# Patient Record
Sex: Female | Born: 1945
Health system: Southern US, Community
[De-identification: ages and names within clinical notes are randomized; demographics above are authoritative.]

## PROBLEM LIST (undated history)

## (undated) DIAGNOSIS — R51 Headache: Secondary | ICD-10-CM

## (undated) DIAGNOSIS — K219 Gastro-esophageal reflux disease without esophagitis: Secondary | ICD-10-CM

## (undated) DIAGNOSIS — Z9889 Other specified postprocedural states: Secondary | ICD-10-CM

## (undated) DIAGNOSIS — R519 Headache, unspecified: Secondary | ICD-10-CM

## (undated) DIAGNOSIS — Z170 Estrogen receptor positive status [ER+]: Principal | ICD-10-CM

## (undated) DIAGNOSIS — E559 Vitamin D deficiency, unspecified: Secondary | ICD-10-CM

## (undated) DIAGNOSIS — G610 Guillain-Barre syndrome: Secondary | ICD-10-CM

## (undated) DIAGNOSIS — M1990 Unspecified osteoarthritis, unspecified site: Secondary | ICD-10-CM

## (undated) DIAGNOSIS — Z8719 Personal history of other diseases of the digestive system: Secondary | ICD-10-CM

## (undated) DIAGNOSIS — R42 Dizziness and giddiness: Secondary | ICD-10-CM

## (undated) DIAGNOSIS — T8859XA Other complications of anesthesia, initial encounter: Secondary | ICD-10-CM

## (undated) DIAGNOSIS — E7800 Pure hypercholesterolemia, unspecified: Secondary | ICD-10-CM

## (undated) DIAGNOSIS — R112 Nausea with vomiting, unspecified: Secondary | ICD-10-CM

## (undated) DIAGNOSIS — J189 Pneumonia, unspecified organism: Secondary | ICD-10-CM

## (undated) DIAGNOSIS — I160 Hypertensive urgency: Secondary | ICD-10-CM

## (undated) DIAGNOSIS — K589 Irritable bowel syndrome without diarrhea: Secondary | ICD-10-CM

## (undated) DIAGNOSIS — C50312 Malignant neoplasm of lower-inner quadrant of left female breast: Principal | ICD-10-CM

## (undated) DIAGNOSIS — T4145XA Adverse effect of unspecified anesthetic, initial encounter: Secondary | ICD-10-CM

## (undated) DIAGNOSIS — I209 Angina pectoris, unspecified: Secondary | ICD-10-CM

## (undated) DIAGNOSIS — F419 Anxiety disorder, unspecified: Secondary | ICD-10-CM

## (undated) DIAGNOSIS — Z8041 Family history of malignant neoplasm of ovary: Secondary | ICD-10-CM

## (undated) DIAGNOSIS — G709 Myoneural disorder, unspecified: Secondary | ICD-10-CM

## (undated) HISTORY — DX: Anxiety disorder, unspecified: F41.9

## (undated) HISTORY — PX: TONSILLECTOMY: SUR1361

## (undated) HISTORY — DX: Dizziness and giddiness: R42

## (undated) HISTORY — DX: Irritable bowel syndrome, unspecified: K58.9

## (undated) HISTORY — PX: TUMOR REMOVAL: SHX12

## (undated) HISTORY — DX: Pure hypercholesterolemia, unspecified: E78.00

## (undated) HISTORY — DX: Vitamin D deficiency, unspecified: E55.9

## (undated) HISTORY — PX: OTHER SURGICAL HISTORY: SHX169

## (undated) HISTORY — DX: Estrogen receptor positive status (ER+): Z17.0

## (undated) HISTORY — DX: Malignant neoplasm of lower-inner quadrant of left female breast: C50.312

## (undated) HISTORY — PX: ABDOMINAL HYSTERECTOMY: SHX81

## (undated) HISTORY — DX: Unspecified osteoarthritis, unspecified site: M19.90

## (undated) HISTORY — DX: Family history of malignant neoplasm of ovary: Z80.41

---
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
## 1998-09-15 ENCOUNTER — Emergency Department (HOSPITAL_COMMUNITY): Admission: EM | Admit: 1998-09-15 | Discharge: 1998-09-15 | Payer: Self-pay | Admitting: Emergency Medicine

## 1998-09-16 ENCOUNTER — Encounter: Payer: Self-pay | Admitting: Internal Medicine

## 1999-01-07 ENCOUNTER — Encounter: Payer: Self-pay | Admitting: Urology

## 1999-01-10 ENCOUNTER — Observation Stay (HOSPITAL_COMMUNITY): Admission: RE | Admit: 1999-01-10 | Discharge: 1999-01-11 | Payer: Self-pay | Admitting: Urology

## 1999-05-26 ENCOUNTER — Inpatient Hospital Stay (HOSPITAL_COMMUNITY): Admission: RE | Admit: 1999-05-26 | Discharge: 1999-05-28 | Payer: Self-pay | Admitting: Gynecology

## 1999-06-21 ENCOUNTER — Observation Stay (HOSPITAL_COMMUNITY): Admission: RE | Admit: 1999-06-21 | Discharge: 1999-06-22 | Payer: Self-pay | Admitting: Gynecology

## 2000-02-29 ENCOUNTER — Other Ambulatory Visit: Admission: RE | Admit: 2000-02-29 | Discharge: 2000-02-29 | Payer: Self-pay | Admitting: Gynecology

## 2001-03-11 ENCOUNTER — Other Ambulatory Visit: Admission: RE | Admit: 2001-03-11 | Discharge: 2001-03-11 | Payer: Self-pay | Admitting: Gynecology

## 2001-08-28 ENCOUNTER — Ambulatory Visit (HOSPITAL_COMMUNITY): Admission: RE | Admit: 2001-08-28 | Discharge: 2001-08-28 | Payer: Self-pay | Admitting: Gastroenterology

## 2002-03-12 ENCOUNTER — Other Ambulatory Visit: Admission: RE | Admit: 2002-03-12 | Discharge: 2002-03-12 | Payer: Self-pay | Admitting: Gynecology

## 2002-11-12 ENCOUNTER — Encounter: Admission: RE | Admit: 2002-11-12 | Discharge: 2002-11-12 | Payer: Self-pay | Admitting: Family Medicine

## 2002-11-12 ENCOUNTER — Encounter: Payer: Self-pay | Admitting: Family Medicine

## 2002-11-28 ENCOUNTER — Ambulatory Visit (HOSPITAL_COMMUNITY): Admission: RE | Admit: 2002-11-28 | Discharge: 2002-11-28 | Payer: Self-pay | Admitting: Endocrinology

## 2002-11-28 ENCOUNTER — Encounter: Payer: Self-pay | Admitting: Endocrinology

## 2002-11-28 ENCOUNTER — Encounter (INDEPENDENT_AMBULATORY_CARE_PROVIDER_SITE_OTHER): Payer: Self-pay | Admitting: *Deleted

## 2003-02-12 ENCOUNTER — Encounter: Admission: RE | Admit: 2003-02-12 | Discharge: 2003-05-13 | Payer: Self-pay | Admitting: Family Medicine

## 2003-04-09 ENCOUNTER — Other Ambulatory Visit: Admission: RE | Admit: 2003-04-09 | Discharge: 2003-04-09 | Payer: Self-pay | Admitting: Gynecology

## 2003-05-14 ENCOUNTER — Encounter: Admission: RE | Admit: 2003-05-14 | Discharge: 2003-06-18 | Payer: Self-pay | Admitting: Family Medicine

## 2004-05-05 ENCOUNTER — Other Ambulatory Visit: Admission: RE | Admit: 2004-05-05 | Discharge: 2004-05-05 | Payer: Self-pay | Admitting: Gynecology

## 2005-05-23 ENCOUNTER — Other Ambulatory Visit: Admission: RE | Admit: 2005-05-23 | Discharge: 2005-05-23 | Payer: Self-pay | Admitting: Gynecology

## 2006-05-22 ENCOUNTER — Inpatient Hospital Stay (HOSPITAL_COMMUNITY): Admission: EM | Admit: 2006-05-22 | Discharge: 2006-05-24 | Payer: Self-pay | Admitting: Emergency Medicine

## 2006-06-12 ENCOUNTER — Other Ambulatory Visit: Admission: RE | Admit: 2006-06-12 | Discharge: 2006-06-12 | Payer: Self-pay | Admitting: Gynecology

## 2006-12-03 ENCOUNTER — Encounter: Admission: RE | Admit: 2006-12-03 | Discharge: 2006-12-03 | Payer: Self-pay | Admitting: Endocrinology

## 2010-07-11 ENCOUNTER — Other Ambulatory Visit: Payer: Self-pay | Admitting: Gynecology

## 2010-08-12 NOTE — H&P (Signed)
Margaret Hodges, Margaret Hodges               ACCOUNT NO.:  [id number]   MEDICAL RECORD NO.:  [id number]          PATIENT TYPE:  INP   LOCATION:  1833                         FACILITY:  [facility]   PHYSICIAN:  Lucita Ferrara, MD         DATE OF BIRTH:  12/28/1945   DATE OF ADMISSION:  05/22/2006  DATE OF DISCHARGE:                              HISTORY & PHYSICAL   HISTORY OF PRESENT ILLNESS:  The patient is a 65 year old female with  past medical history significant for irritable bowel syndrome,  hyperlipidemia and Guillain-Barre syndrome, who presents to [facility] with chief complaint of anterior chest pain.  Chest pain  is located in the anterior precordial area, radiating to the jaw.  Chest  pain apparently started earlier in the day, not associated with food,  non-reproducible, no association with movement or body position.  Chest  pain was pressure-like in nature, again radiating to the jaw area and  into the left arm.  She states that this has never happened before.  Chest pain was accompanied by some shortness of breath.  The patient  denies ever having any cardiovascular procedures.  She states that she  has never had a stress test.  Of note, the patient is a smoker.   PAST MEDICAL HISTORY:  1. Arthritis.  2. Guillain-Barre syndrome.  3. Hyperlipidemia.  4. Irritable bowel syndrome.  5. The patient has a history of an ulcerative esophagus and IBS.   MEDICATIONS:  The patient is only taking Percocet as needed for joint  pain.   ALLERGIES:  The patient is allergic to:  1. FEXOFENADINE.  2. KEFLEX.  3. ALL PENICILLINS.   PHYSICAL EXAMINATION:  GENERAL:  The patient is in no acute distress.  VITAL SIGNS:  Blood pressure 152/93, pulse is 93, respirations 18, pulse  oximetry 97% on room air.  HEENT:  Normocephalic, atraumatic.  No JVD.  No carotid bruits.  PERRLA.  Extraocular muscles intact.  NECK:  Supple.  No thyromegaly.  CARDIOVASCULAR:  S1 and S2.  Regular rate and  rhythm.  No murmurs, rubs  or clicks.  LUNGS:  Clear to auscultation bilaterally.  No rales, rhonchi or  wheezes.  ABDOMEN:  Soft, non-distended and nontender.  Positive bowel sounds.  NEUROLOGIC:  Alert and oriented x3.  Cranial nerves II-XII grossly  intact.  Cerebellar intact.  Reflexes 2+ bilaterally, upper and lower  extremities.  Pulses equal to 2+ bilaterally, upper and lower  extremities.  Strength 5/5 bilaterally, upper and lower extremities.   LABORATORY AND ACCESSORY CLINICAL DATA:  Sodium 137, potassium 4.2,  chloride 109, BUN 25, glucose 94.  White count 12.2, hemoglobin 14.4,  hematocrit 42, platelets 292,000.  Cardiac enzymes negative x3.   Chest x-ray within normal limits.   EKG was consistent with left axis deviation, anterior wall ST-T wave  changes.  Interestingly enough, EKG changes were resolved 2 hours later  after the patient was given pain medications.   ASSESSMENT AND PLAN:  [age]-year-old patient with chest pain, rule  out myocardial infarction or ischemia.  We will go ahead  and admit to  the hospital for overnight observation, cardiac enzymes x3 every 8  hours.  I am going to go ahead and order a thallium exercise stress  test.  I am going to go ahead and consult Cardiology.  Of note above,  the patient actually had EKG changes which resolved.  Her symptoms are  classic and good/consistent with possible ischemic-related cause of her  chest pain.  The patient has been explained the plans and procedures of  this admission; she understands.      Lucita Ferrara, MD  Electronically Signed     RR/MEDQ  D:  05/23/2006  T:  05/23/2006  Job:  [id number]

## 2010-08-12 NOTE — Procedures (Signed)
[place]  Patient:    Margaret Hodges, Margaret Hodges Visit Number: [id number] MRN: [id number]          Service Type: END Location: ENDO Attending Physician:  Charna Elizabeth Dictated by:   Anselmo Rod, M.D. Proc. Date: 08/28/01 Admit Date:  08/28/2001 Discharge Date: 08/28/2001   CC:         Caryn Bee L. Little, M.D.   Procedure Report  DATE OF BIRTH:  1945-06-18.  REFERRING PHYSICIAN:  Anna Genre. Little, M.D.  PROCEDURE PERFORMED:  Colonoscopy.  ENDOSCOPIST:  Anselmo Rod, M.D.  INSTRUMENT USED:  Olympus pediatric video colonoscope.  INDICATIONS FOR PROCEDURE:  The patient is a 65 year old white female with a history of abdominal pain, guaiac positive stool and family history of colonic polyps and colitis.  Rule out masses, polyps etc.  PREPROCEDURE PREPARATION:  Informed consent was procured from the patient. The patient was fasted for eight hours prior to the procedure and prepped with a bottle of magnesium citrate and a gallon of NuLytely the night prior to the procedure.  PREPROCEDURE PHYSICAL:  The patient had stable vital signs.  Neck supple. Chest clear to auscultation.  S1, S2 regular.  Abdomen soft with normal bowel sounds.  DESCRIPTION OF PROCEDURE:  The patient was placed in the left lateral decubitus position and sedated with 80 mg of Demerol and 8 mg of Versed intravenously.  Once the patient was adequately sedated and maintained on low-flow oxygen and continuous cardiac monitoring, the Olympus pediatric colonoscope was advanced from the rectum to the cecum without difficulty.  The patient had a few left-sided diverticula, small nonbleeding internal hemorrhoids seen on retroflexion.  No masses or polyps were seen.  The procedure was completed up to the cecum.  The appendiceal and ileocecal valve were clearly visualized and photographed.  IMPRESSION: 1. Small nonbleeding internal hemorrhoid. 2. Few left-sided  diverticula.  RECOMMENDATIONS: 1. A high fiber diet has been recommended for the patient. 2. Repeat colorectal cancer screening is recommended in the next five years    unless the patient develops any abnormal symptoms in the interim. 3. Outpatient follow-up in the next one to two weeks.Dictated by:   Anselmo Rod, M.D. Attending Physician:  Charna Elizabeth DD:  08/28/01 TD:  08/30/01 Job: [id number]

## 2010-08-12 NOTE — Consult Note (Signed)
Margaret Hodges, Margaret Hodges               ACCOUNT NO.:  [id number]   MEDICAL RECORD NO.:  [id number]          PATIENT TYPE:  INP   LOCATION:  3731                         FACILITY:  [facility]   PHYSICIAN:  Corky Crafts, MDDATE OF BIRTH:  1946-01-13   DATE OF CONSULTATION:  DATE OF DISCHARGE:                                 CONSULTATION   REASON FOR CONSULTATION:  Chest pain.   HISTORY OF PRESENT ILLNESS:  The patient is a 65 year old woman with no  known history of heart disease who has had 6 weeks of intermittent  substernal chest pain which is worse with activity and better with rest.  She states that in the past her chest tightness has been associated with  lightheadedness and arm tingling.  She also feels short of breath and  has neck pain.  She denies nausea or vomiting.  She has not had syncope.  She had been on prednisone recently because of some joint problems.  She  did have some chest pain several years ago when taking prednisone, but  these resolved spontaneously and did not involve her arm.  They were not  as severe at that time.   PAST MEDICAL HISTORY:  1. Hyperlipidemia.  2. History of Guillain Barre syndrome.  3. IBS.  4. Arthritis.  5. Sciatica.   SOCIAL HISTORY:  The patient does smoke.  She is thinking about  quitting.  She denies alcohol.  She does use any illegal drugs.  She  works as a Nature conservation officer at [employer].  She is married.   FAMILY HISTORY:  Mother died at age 57 but had coronary artery disease  diagnosed in her 31s.  Mother also had diabetes and hypertension.   ALLERGIES:  TO FEXOFENADINE, KEFLEX AND PENICILLIN.   MEDICATIONS AT HOME:  Vicodin p.r.n.   REVIEW OF SYSTEMS:  No recent fevers or chills, no weight loss, no rash.  No focal weakness, chest pain or shortness of breath as described above.  No orthopnea, no PND.  All other systems negative.   PHYSICAL EXAM:  VITAL SIGNS:  99/78, heart rate of 63.  GENERAL:  Patient is awake, alert, in no apparent  distress.  HEAD:  Normocephalic, atraumatic.  EYES:  Extraocular movements intact.  NECK:  No carotid bruits.  CARDIOVASCULAR:  Regular rate and rhythm, S1-S2, no murmurs.  LUNGS:  Clear to auscultation bilaterally.  ABDOMEN:  Soft, nontender, nondistended.  EXTREMITIES:  Showed no edema.  NEURO:  No focal motor or sensory deficits.  SKIN:  No rash.  BACK:  No kyphosis.  PSYCH:  Normal in affect.   EKG reveals normal sinus rhythm, no acute ST-T wave changes.  Chest x-  ray shows no acute cardiopulmonary disease.   LAB WORK:  Shows creatinine 0.72, troponin 0.02.   ASSESSMENT/PLAN:  A 65 year old with risk factors for heart disease who  is having ongoing chest pain.   PLAN:  1. We discussed various types of evaluation including stress test and      catheterization.  Given now that she is having some symptoms of      chest pain at  rest, will proceed with a cardiac catheterization.      Even if she had a negative stress test, I am not sure I would      believe the results.  Continue Lovenox for now.  Add nitrates as      necessary.  The risks and benefits of cardiac catheterization were      explained to the patient, and informed consent was obtained  2. Hyperlipidemia.  Will check her lipids.  3. We recommend smoking cessation as well.  4. The cath is planned for tomorrow.      Corky Crafts, MD  Electronically Signed     JSV/MEDQ  D:  05/24/2006  T:  05/24/2006  Job:  [id number]

## 2010-08-12 NOTE — Cardiovascular Report (Signed)
Margaret Hodges, Margaret Hodges               ACCOUNT NO.:  [id number]   MEDICAL RECORD NO.:  [id number]          PATIENT TYPE:  INP   LOCATION:  3731                         FACILITY:  [facility]   PHYSICIAN:  Corky Crafts, MDDATE OF BIRTH:  22-Nov-1945   DATE OF PROCEDURE:  DATE OF DISCHARGE:                            CARDIAC CATHETERIZATION   REFERRING PHYSICIAN:  Dr. Lucita Ferrara   PROCEDURES PERFORMED:  1. Left heart catheterization.  2. Left ventriculogram.  3. Coronary angiogram.  4. Abdominal aortogram.   INDICATIONS:  Unstable angina.   OPERATOR:  Dr. Eldridge Dace   PROCEDURE NARRATIVE:  The risks and benefits of cardiac catheterization  were explained to the patient and informed consent was obtained.  The  patient was brought to the cath lab, she was prepped and draped in the  usual sterile fashion, her right groin was infiltrated with 1%  lidocaine.  A 6-French arterial sheath was placed into the right femoral  artery using the modified Seldinger technique.  Left coronary artery  angiography was performed using a JL-4 pigtail catheter.  The catheter  was advanced to the vessel ostium under fluoroscopic guidance, visual  angiography was performed in multiple projections using hand injection  of contrast.  Right coronary artery angiography was performed using a JR-  4 pigtail catheter.  The catheter was advanced to the vessel ostium  under fluoroscopic guidance, visual angiography was performed in  multiple projections using hand injection of contrast.  A left  ventriculogram was then performed using a pigtail catheter.  The  catheter was advanced to the aortic valve and across under fluoroscopic  guidance.  Power injection of contrast was performed in a 30-degree RAO  position.  The catheter was then pulled back under continuous  hemodynamic pressure monitoring, catheter was pulled back to the level  of the renal arteries.  Power injection of contrast was done to  visualize the  abdominal aorta.  The sheath will be removed using manual  compression.   FINDINGS:  The left main is a fairly long vessel which is widely patent.   Circumflex is a middle-to-large-size vessel which appears  angiographically normal.   There is a medium-size OM1 and OM2 which are angiographically normal.   The left anterior descending is a middle-size vessel with mild luminal  irregularities.   The first diagonal is a large vessel and appears angiographically  normal.   The right coronary artery is a medium-size dominant vessel, there was  catheter spasm noted at the ostium and its vessel.  Overall, there is no  significant atherosclerosis noted.   The left ventriculogram reveals normal left ventricular function with an  estimated ejection fraction of 60%, there is no mitral regurgitation.   The abdominal aortogram shows widely patent renal arteries bilaterally,  there are single renal arteries to both kidneys, there is mild abdominal  atherosclerosis below the renal arteries.   HEMODYNAMICS:  Left ventricular pressure 127/3 with an LVEDP of 12 mmHg,  aortic pressure of 127/70 with a mean aortic pressure of 96 mmHg.   IMPRESSION:  1. No significant coronary artery disease.  2.  Noncardiac chest pain.  3. Normal left ventricular function.  4. No renal artery stenosis.   RECOMMENDATIONS:  Patient should continue with aggressive risk-factor  modification, but have her start aspirin 81 mg a day and encourage her  to avoid any tobacco products.  She should also have aggressive  management of her lipids.      Corky Crafts, MD  Electronically Signed     JSV/MEDQ  D:  05/24/2006  T:  05/24/2006  Job:  [id number]

## 2011-07-18 ENCOUNTER — Other Ambulatory Visit: Payer: Self-pay | Admitting: Gynecology

## 2011-12-21 DIAGNOSIS — D7589 Other specified diseases of blood and blood-forming organs: Secondary | ICD-10-CM | POA: Diagnosis not present

## 2011-12-21 DIAGNOSIS — Z79899 Other long term (current) drug therapy: Secondary | ICD-10-CM | POA: Diagnosis not present

## 2011-12-21 DIAGNOSIS — E531 Pyridoxine deficiency: Secondary | ICD-10-CM | POA: Diagnosis not present

## 2011-12-21 DIAGNOSIS — E782 Mixed hyperlipidemia: Secondary | ICD-10-CM | POA: Diagnosis not present

## 2011-12-21 DIAGNOSIS — E559 Vitamin D deficiency, unspecified: Secondary | ICD-10-CM | POA: Diagnosis not present

## 2012-08-05 DIAGNOSIS — J329 Chronic sinusitis, unspecified: Secondary | ICD-10-CM | POA: Diagnosis not present

## 2012-08-05 DIAGNOSIS — M7150 Other bursitis, not elsewhere classified, unspecified site: Secondary | ICD-10-CM | POA: Diagnosis not present

## 2012-08-05 DIAGNOSIS — F17200 Nicotine dependence, unspecified, uncomplicated: Secondary | ICD-10-CM | POA: Diagnosis not present

## 2012-11-19 DIAGNOSIS — Z1231 Encounter for screening mammogram for malignant neoplasm of breast: Secondary | ICD-10-CM | POA: Diagnosis not present

## 2013-03-07 DIAGNOSIS — Z0000 Encounter for general adult medical examination without abnormal findings: Secondary | ICD-10-CM | POA: Diagnosis not present

## 2013-03-07 DIAGNOSIS — D7589 Other specified diseases of blood and blood-forming organs: Secondary | ICD-10-CM | POA: Diagnosis not present

## 2013-03-07 DIAGNOSIS — E7800 Pure hypercholesterolemia, unspecified: Secondary | ICD-10-CM | POA: Diagnosis not present

## 2013-03-07 DIAGNOSIS — E785 Hyperlipidemia, unspecified: Secondary | ICD-10-CM | POA: Diagnosis not present

## 2013-03-07 DIAGNOSIS — E041 Nontoxic single thyroid nodule: Secondary | ICD-10-CM | POA: Diagnosis not present

## 2013-03-07 DIAGNOSIS — M1990 Unspecified osteoarthritis, unspecified site: Secondary | ICD-10-CM | POA: Diagnosis not present

## 2013-03-07 DIAGNOSIS — E559 Vitamin D deficiency, unspecified: Secondary | ICD-10-CM | POA: Diagnosis not present

## 2013-03-07 DIAGNOSIS — E049 Nontoxic goiter, unspecified: Secondary | ICD-10-CM | POA: Diagnosis not present

## 2013-03-07 DIAGNOSIS — K649 Unspecified hemorrhoids: Secondary | ICD-10-CM | POA: Diagnosis not present

## 2013-03-25 DIAGNOSIS — E8941 Symptomatic postprocedural ovarian failure: Secondary | ICD-10-CM | POA: Diagnosis not present

## 2013-04-01 DIAGNOSIS — L820 Inflamed seborrheic keratosis: Secondary | ICD-10-CM | POA: Diagnosis not present

## 2013-04-01 DIAGNOSIS — L570 Actinic keratosis: Secondary | ICD-10-CM | POA: Diagnosis not present

## 2013-04-01 DIAGNOSIS — D235 Other benign neoplasm of skin of trunk: Secondary | ICD-10-CM | POA: Diagnosis not present

## 2013-04-01 DIAGNOSIS — D2320 Other benign neoplasm of skin of unspecified ear and external auricular canal: Secondary | ICD-10-CM | POA: Diagnosis not present

## 2013-04-23 DIAGNOSIS — D2320 Other benign neoplasm of skin of unspecified ear and external auricular canal: Secondary | ICD-10-CM | POA: Diagnosis not present

## 2013-05-08 DIAGNOSIS — E559 Vitamin D deficiency, unspecified: Secondary | ICD-10-CM | POA: Diagnosis not present

## 2014-01-06 DIAGNOSIS — Z1231 Encounter for screening mammogram for malignant neoplasm of breast: Secondary | ICD-10-CM | POA: Diagnosis not present

## 2014-04-01 DIAGNOSIS — J329 Chronic sinusitis, unspecified: Secondary | ICD-10-CM | POA: Diagnosis not present

## 2014-04-01 DIAGNOSIS — J029 Acute pharyngitis, unspecified: Secondary | ICD-10-CM | POA: Diagnosis not present

## 2014-04-04 ENCOUNTER — Emergency Department (HOSPITAL_COMMUNITY): Payer: Medicare Other

## 2014-04-04 ENCOUNTER — Observation Stay (HOSPITAL_COMMUNITY)
Admission: EM | Admit: 2014-04-04 | Discharge: 2014-04-06 | Disposition: A | Discharge status: Home / Self Care | Payer: Medicare Other | Attending: Internal Medicine | Admitting: Internal Medicine

## 2014-04-04 ENCOUNTER — Encounter (HOSPITAL_COMMUNITY): Payer: Self-pay | Admitting: Emergency Medicine

## 2014-04-04 DIAGNOSIS — E785 Hyperlipidemia, unspecified: Secondary | ICD-10-CM | POA: Diagnosis present

## 2014-04-04 DIAGNOSIS — I10 Essential (primary) hypertension: Secondary | ICD-10-CM | POA: Diagnosis not present

## 2014-04-04 DIAGNOSIS — Z888 Allergy status to other drugs, medicaments and biological substances status: Secondary | ICD-10-CM | POA: Insufficient documentation

## 2014-04-04 DIAGNOSIS — Z8249 Family history of ischemic heart disease and other diseases of the circulatory system: Secondary | ICD-10-CM | POA: Insufficient documentation

## 2014-04-04 DIAGNOSIS — R079 Chest pain, unspecified: Principal | ICD-10-CM | POA: Diagnosis present

## 2014-04-04 DIAGNOSIS — I160 Hypertensive urgency: Secondary | ICD-10-CM | POA: Diagnosis present

## 2014-04-04 DIAGNOSIS — Z720 Tobacco use: Secondary | ICD-10-CM | POA: Diagnosis present

## 2014-04-04 DIAGNOSIS — F17210 Nicotine dependence, cigarettes, uncomplicated: Secondary | ICD-10-CM | POA: Diagnosis not present

## 2014-04-04 DIAGNOSIS — Z881 Allergy status to other antibiotic agents status: Secondary | ICD-10-CM | POA: Diagnosis not present

## 2014-04-04 DIAGNOSIS — Z880 Allergy status to penicillin: Secondary | ICD-10-CM | POA: Insufficient documentation

## 2014-04-04 DIAGNOSIS — R1013 Epigastric pain: Secondary | ICD-10-CM | POA: Diagnosis not present

## 2014-04-04 DIAGNOSIS — Z882 Allergy status to sulfonamides status: Secondary | ICD-10-CM | POA: Diagnosis not present

## 2014-04-04 DIAGNOSIS — R072 Precordial pain: Secondary | ICD-10-CM | POA: Diagnosis not present

## 2014-04-04 DIAGNOSIS — I871 Compression of vein: Secondary | ICD-10-CM | POA: Diagnosis not present

## 2014-04-04 HISTORY — DX: Hypertensive urgency: I16.0

## 2014-04-04 LAB — CBC WITH DIFFERENTIAL/PLATELET
BASOS ABS: 0 10*3/uL (ref 0.0–0.1)
Basophils Relative: 0 % (ref 0–1)
Eosinophils Absolute: 0.5 10*3/uL (ref 0.0–0.7)
Eosinophils Relative: 4 % (ref 0–5)
HEMATOCRIT: 41.7 % (ref 36.0–46.0)
HEMOGLOBIN: 14.2 g/dL (ref 12.0–15.0)
LYMPHS ABS: 4.1 10*3/uL — AB (ref 0.7–4.0)
LYMPHS PCT: 36 % (ref 12–46)
MCH: 33.4 pg (ref 26.0–34.0)
MCHC: 34.1 g/dL (ref 30.0–36.0)
MCV: 98.1 fL (ref 78.0–100.0)
MONO ABS: 1 10*3/uL (ref 0.1–1.0)
MONOS PCT: 9 % (ref 3–12)
NEUTROS ABS: 5.9 10*3/uL (ref 1.7–7.7)
NEUTROS PCT: 51 % (ref 43–77)
Platelets: 254 10*3/uL (ref 150–400)
RBC: 4.25 MIL/uL (ref 3.87–5.11)
RDW: 13.3 % (ref 11.5–15.5)
WBC: 11.5 10*3/uL — ABNORMAL HIGH (ref 4.0–10.5)

## 2014-04-04 LAB — URINALYSIS, ROUTINE W REFLEX MICROSCOPIC
BILIRUBIN URINE: NEGATIVE
Glucose, UA: NEGATIVE mg/dL
Hgb urine dipstick: NEGATIVE
Ketones, ur: NEGATIVE mg/dL
Leukocytes, UA: NEGATIVE
NITRITE: NEGATIVE
PROTEIN: NEGATIVE mg/dL
Specific Gravity, Urine: 1.017 (ref 1.005–1.030)
UROBILINOGEN UA: 0.2 mg/dL (ref 0.0–1.0)
pH: 6 (ref 5.0–8.0)

## 2014-04-04 LAB — I-STAT TROPONIN, ED: TROPONIN I, POC: 0 ng/mL (ref 0.00–0.08)

## 2014-04-04 LAB — COMPREHENSIVE METABOLIC PANEL
ALBUMIN: 3.9 g/dL (ref 3.5–5.2)
ALT: 39 U/L — ABNORMAL HIGH (ref 0–35)
ANION GAP: 10 (ref 5–15)
AST: 27 U/L (ref 0–37)
Alkaline Phosphatase: 74 U/L (ref 39–117)
BUN: 18 mg/dL (ref 6–23)
CALCIUM: 9.8 mg/dL (ref 8.4–10.5)
CO2: 21 mmol/L (ref 19–32)
Chloride: 103 mEq/L (ref 96–112)
Creatinine, Ser: 0.83 mg/dL (ref 0.50–1.10)
GFR calc Af Amer: 83 mL/min — ABNORMAL LOW (ref >90)
GFR, EST NON AFRICAN AMERICAN: 71 mL/min — AB (ref >90)
GLUCOSE: 112 mg/dL — AB (ref 70–99)
Potassium: 4 mmol/L (ref 3.5–5.1)
SODIUM: 134 mmol/L — AB (ref 135–145)
TOTAL PROTEIN: 6.8 g/dL (ref 6.0–8.3)
Total Bilirubin: 0.3 mg/dL (ref 0.3–1.2)

## 2014-04-04 LAB — D-DIMER, QUANTITATIVE (NOT AT ARMC)

## 2014-04-04 MED ORDER — SODIUM CHLORIDE 0.9 % IV SOLN
Freq: Once | INTRAVENOUS | Status: AC
  Administered 2014-04-04: 22:00:00 via INTRAVENOUS

## 2014-04-04 MED ORDER — IOHEXOL 350 MG/ML SOLN
100.0000 mL | Freq: Once | INTRAVENOUS | Status: AC | PRN
  Administered 2014-04-04: 100 mL via INTRAVENOUS

## 2014-04-04 NOTE — ED Notes (Signed)
Pick[place], MD at bedside.  

## 2014-04-04 NOTE — ED Notes (Signed)
Per EMS, pt started having sharp, substernal chest pain around 1900 this evening. Pt took her BP at home and got 601 systolic. Upon EMS arrival to her home, the pt's pressure was 194/100. Pt states that she is not currently treated for HTN, but her doctor may put her on medication. Pt took 324 ASA at home, no NTG given.

## 2014-04-04 NOTE — ED Notes (Signed)
Olean Ree, NP at bedside.

## 2014-04-04 NOTE — ED Provider Notes (Signed)
CSN: [id number]     Arrival date & time 04/04/14  2019 History   First MD Initiated Contact with Patient 04/04/14 2024     Chief Complaint  Patient presents with  . Chest Pain     (Consider location/radiation/quality/duration/timing/severity/associated sxs/prior Treatment) HPI Comments: For the past 3 days has had constant epigastric pain that radiated to back Has had this pain for a "while" but worse in the last 3 days   Patient is a 69 y.o. female presenting with chest pain. The history is provided by the patient.  Chest Pain Pain location:  Substernal area Pain quality: pressure   Pain radiates to:  Mid back Pain radiates to the back: yes   Pain severity:  Moderate Onset quality:  Gradual Timing:  Constant Progression:  Resolved Chronicity:  New Context: at rest   Context: not breathing, not eating, not lifting, no movement, not raising an arm and no stress   Relieved by:  Aspirin Ineffective treatments:  None tried Associated symptoms: back pain   Associated symptoms: no abdominal pain, no anxiety, no cough, no diaphoresis, no dysphagia, no fever, no nausea, no numbness, no palpitations, no shortness of breath and no weakness   Risk factors: obesity     Past Medical History  Diagnosis Date  . Hypertensive urgency 04/05/2014   Past Surgical History  Procedure Laterality Date  . Tonsillectomy    . Abdominal hysterectomy    . Tumor removal     History reviewed. No pertinent family history. History  Substance Use Topics  . Smoking status: Current Every Day Smoker -- 0.50 packs/day    Types: Cigarettes  . Smokeless tobacco: Not on file  . Alcohol Use: No   OB History    No data available     Review of Systems  Constitutional: Negative for fever and diaphoresis.  HENT: Negative for trouble swallowing.   Respiratory: Negative for cough and shortness of breath.   Cardiovascular: Positive for chest pain. Negative for palpitations and leg swelling.   Gastrointestinal: Negative for nausea and abdominal pain.  Genitourinary: Negative for dysuria.  Musculoskeletal: Positive for back pain.  Neurological: Negative for weakness and numbness.      Allergies  Allegra; Amoxapine and related; Clindamycin/lincomycin; Keflex; Penicillins; Prednisone; Statins; and Sulfa antibiotics  Home Medications   Prior to Admission medications   Medication Sig Start Date End Date Taking? Authorizing Provider  azithromycin (ZITHROMAX) 250 MG tablet Take 1 tablet by mouth daily. 5 day regimen started 04/01/14 04/01/14  Yes Historical Provider, MD  conjugated estrogens (PREMARIN) vaginal cream Place 1 Applicatorful vaginally daily.   Yes Historical Provider, MD  diclofenac sodium (VOLTAREN) 1 % GEL Apply 4 g topically 4 (four) times daily.   Yes Historical Provider, MD  HYDROcodone-acetaminophen (NORCO/VICODIN) 5-325 MG per tablet Take 1 tablet by mouth every 6 (six) hours as needed for moderate pain.   Yes Historical Provider, MD  pseudoephedrine-acetaminophen (TYLENOL SINUS) 30-500 MG TABS Take 1 tablet by mouth every 4 (four) hours as needed.   Yes Historical Provider, MD   BP 129/77 mmHg  Pulse 85  Temp(Src) 97.8 F (36.6 C) (Oral)  Resp 18  Ht 5' 8.5" (1.74 m)  Wt 173 lb 4.8 oz (78.608 kg)  BMI 25.96 kg/m2  SpO2 97% Physical Exam  Constitutional: She is oriented to person, place, and time. She appears well-developed and well-nourished. No distress.  HENT:  Head: Normocephalic and atraumatic.  Eyes: Pupils are equal, round, and reactive to light.  Neck:  Normal range of motion.  Cardiovascular: Normal rate and regular rhythm.   Pulmonary/Chest: Effort normal and breath sounds normal. She has no wheezes.  Abdominal: Soft. She exhibits no distension. There is no tenderness.  Musculoskeletal: Normal range of motion. She exhibits no edema or tenderness.  Neurological: She is alert and oriented to person, place, and time.  Skin: Skin is warm. No  erythema.  Nursing note and vitals reviewed.   ED Course  Procedures (including critical care time) Labs Review Labs Reviewed  CBC WITH DIFFERENTIAL - Abnormal; Notable for the following:    WBC 11.5 (*)    Lymphs Abs 4.1 (*)    All other components within normal limits  COMPREHENSIVE METABOLIC PANEL - Abnormal; Notable for the following:    Sodium 134 (*)    Glucose, Bld 112 (*)    ALT 39 (*)    GFR calc non Af Amer 71 (*)    GFR calc Af Amer 83 (*)    All other components within normal limits  D-DIMER, QUANTITATIVE  URINALYSIS, ROUTINE W REFLEX MICROSCOPIC  TROPONIN I  TROPONIN I  TROPONIN I  LIPID PANEL  I-STAT TROPOININ, ED  I-STAT TROPOININ, ED    Imaging Review Dg Chest Port 1 View  04/04/2014   CLINICAL DATA:  Initial evaluation for sharp substernal chest pain for 2 hr, high blood pressure  EXAM: PORTABLE CHEST - 1 VIEW  COMPARISON:  05/23/2006  FINDINGS: Heart size and vascular pattern are normal. Ectasia of the aorta. Lungs clear. No pleural effusion.  IMPRESSION: No active disease.   Electronically Signed   By: Skipper Cliche M.D.   On: 04/04/2014 21:02   Ct Angio Chest Aorta W/cm &/or Wo/cm  04/04/2014   CLINICAL DATA:  Sharp substernal chest pain around 7 o'clock. Elevated blood pressure tumor 20. Chest pain the radiates to back. Concern for aortic dissection  EXAM: CT ANGIOGRAPHY CHEST, ABDOMEN AND PELVIS  TECHNIQUE: Multidetector CT imaging through the chest, abdomen and pelvis was performed using the standard protocol during bolus administration of intravenous contrast. Multiplanar reconstructed images and MIPs were obtained and reviewed to evaluate the vascular anatomy.  CONTRAST:  150mL OMNIPAQUE IOHEXOL 350 MG/ML SOLN  COMPARISON:  None.  FINDINGS: CTA CHEST FINDINGS  Non IV contrast images demonstrate no density within the aorta to suggest intramural hematoma. There are scattered intimal calcifications. Contrastimages demonstrates no evidence of dissection or  aneurysm within the ascending, transverse, or descending thoracic aorta. There is an aberrant right subclavian artery extending posterior to the esophagus. There is a large mixed density plaque at the origin of the right subclavian artery seen on coronal image 76. There is no evidence of filling defects within the pulmonary arteries to suggest acute pulmonary embolism. No pericardial fluid. The great vessels are otherwise normal.  No pulmonary edema or pleural fluid. There is a lesion in the left lower lobe measuring 10 mm which appears partially calcified therefore is likely benign.  Enlargement of the left lobe of thyroid gland measuring 3.8 x 2.9 cm with central calcifications.  Review of the MIP images confirms the above findings.  CTA ABDOMEN AND PELVIS FINDINGS  No evidence dissection of the abdominal aorta. No evidence of aneurysm of the abdominal aorta or iliac vessels. The celiac trunk and SMA are patent. Bilateral patent renal arteries. The IMA is patent. There are scattered intimal calcifications throughout the abdominal aorta.  No focal hepatic lesion. The gallbladder, pancreas, spleen, adrenal glands, kidneys are normal. The stomach, small  bowel, and colon are unremarkable.  No free fluid the pelvis. Post hysterectomy anatomy. The bladder normal. No pelvic lymphadenopathy. No aggressive osseous lesion  Review of the MIP images confirms the above findings.  IMPRESSION: Chest Impression:  1. No evidence of aortic dissection or aneurysm. 2. Aberrant right subclavian artery. This vascular anomaly can cause pain with swallowing (dysphagia lasoria ). 3. Enlargement of the left lobe of the thyroid gland. Consider thyroid ultrasound further evaluation.  Abdomen / Pelvis Impression:  No evidence and aortic aneurysm or dissection.   Electronically Signed   By: Suzy Bouchard M.D.   On: 04/04/2014 23:37   Ct Angio Abd/pel W/ And/or W/o  04/04/2014   CLINICAL DATA:  Sharp substernal chest pain around 7 o'clock.  Elevated blood pressure tumor 20. Chest pain the radiates to back. Concern for aortic dissection  EXAM: CT ANGIOGRAPHY CHEST, ABDOMEN AND PELVIS  TECHNIQUE: Multidetector CT imaging through the chest, abdomen and pelvis was performed using the standard protocol during bolus administration of intravenous contrast. Multiplanar reconstructed images and MIPs were obtained and reviewed to evaluate the vascular anatomy.  CONTRAST:  127mL OMNIPAQUE IOHEXOL 350 MG/ML SOLN  COMPARISON:  None.  FINDINGS: CTA CHEST FINDINGS  Non IV contrast images demonstrate no density within the aorta to suggest intramural hematoma. There are scattered intimal calcifications. Contrastimages demonstrates no evidence of dissection or aneurysm within the ascending, transverse, or descending thoracic aorta. There is an aberrant right subclavian artery extending posterior to the esophagus. There is a large mixed density plaque at the origin of the right subclavian artery seen on coronal image 76. There is no evidence of filling defects within the pulmonary arteries to suggest acute pulmonary embolism. No pericardial fluid. The great vessels are otherwise normal.  No pulmonary edema or pleural fluid. There is a lesion in the left lower lobe measuring 10 mm which appears partially calcified therefore is likely benign.  Enlargement of the left lobe of thyroid gland measuring 3.8 x 2.9 cm with central calcifications.  Review of the MIP images confirms the above findings.  CTA ABDOMEN AND PELVIS FINDINGS  No evidence dissection of the abdominal aorta. No evidence of aneurysm of the abdominal aorta or iliac vessels. The celiac trunk and SMA are patent. Bilateral patent renal arteries. The IMA is patent. There are scattered intimal calcifications throughout the abdominal aorta.  No focal hepatic lesion. The gallbladder, pancreas, spleen, adrenal glands, kidneys are normal. The stomach, small bowel, and colon are unremarkable.  No free fluid the pelvis.  Post hysterectomy anatomy. The bladder normal. No pelvic lymphadenopathy. No aggressive osseous lesion  Review of the MIP images confirms the above findings.  IMPRESSION: Chest Impression:  1. No evidence of aortic dissection or aneurysm. 2. Aberrant right subclavian artery. This vascular anomaly can cause pain with swallowing (dysphagia lasoria ). 3. Enlargement of the left lobe of the thyroid gland. Consider thyroid ultrasound further evaluation.  Abdomen / Pelvis Impression:  No evidence and aortic aneurysm or dissection.   Electronically Signed   By: Suzy Bouchard M.D.   On: 04/04/2014 23:37     EKG Interpretation   Date/Time:  Saturday April 04 2014 20:22:14 EST Ventricular Rate:  88 PR Interval:  153 QRS Duration: 83 QT Interval:  375 QTC Calculation: 454 R Axis:   -51 Text Interpretation:  Sinus rhythm Left anterior fascicular block Low  voltage, precordial leads No significant change was found Confirmed by  CAMPOS  MD, KEVIN ([id number]) on 04/05/2014 1:02:48 AM  MDM  2 sets of cardiac markers, negative chest angio and abdominal angio that rules out aneurysm but has return of epigastric pain will try GI cocktail  Final diagnoses:  Chest pain  Epigastric pain         Garald Balding, NP 04/05/14 [place] Alvino Chapel, MD 04/06/14 [phone]

## 2014-04-05 ENCOUNTER — Encounter (HOSPITAL_COMMUNITY): Payer: Self-pay | Admitting: Internal Medicine

## 2014-04-05 DIAGNOSIS — E785 Hyperlipidemia, unspecified: Secondary | ICD-10-CM

## 2014-04-05 DIAGNOSIS — I10 Essential (primary) hypertension: Secondary | ICD-10-CM | POA: Diagnosis not present

## 2014-04-05 DIAGNOSIS — R079 Chest pain, unspecified: Secondary | ICD-10-CM | POA: Diagnosis not present

## 2014-04-05 DIAGNOSIS — R0789 Other chest pain: Secondary | ICD-10-CM | POA: Diagnosis not present

## 2014-04-05 DIAGNOSIS — R072 Precordial pain: Secondary | ICD-10-CM | POA: Diagnosis not present

## 2014-04-05 DIAGNOSIS — Z720 Tobacco use: Secondary | ICD-10-CM | POA: Diagnosis not present

## 2014-04-05 DIAGNOSIS — I160 Hypertensive urgency: Secondary | ICD-10-CM

## 2014-04-05 HISTORY — DX: Hypertensive urgency: I16.0

## 2014-04-05 LAB — TROPONIN I
Troponin I: <0.03 ng/mL (ref <0.031)
Troponin I: <0.03 ng/mL (ref <0.031)

## 2014-04-05 LAB — I-STAT TROPONIN, ED: TROPONIN I, POC: 0 ng/mL (ref 0.00–0.08)

## 2014-04-05 MED ORDER — SALINE SPRAY 0.65 % NA SOLN
1.0000 | NASAL | Status: DC | PRN
  Administered 2014-04-05: 1 via NASAL
  Filled 2014-04-05 (×2): qty 44

## 2014-04-05 MED ORDER — AMLODIPINE BESYLATE 5 MG PO TABS
5.0000 mg | ORAL_TABLET | Freq: Every day | ORAL | Status: DC
  Administered 2014-04-05 – 2014-04-06 (×2): 5 mg via ORAL
  Filled 2014-04-05 (×3): qty 1

## 2014-04-05 MED ORDER — AZITHROMYCIN 250 MG PO TABS
250.0000 mg | ORAL_TABLET | Freq: Once | ORAL | Status: AC
  Administered 2014-04-05: 250 mg via ORAL
  Filled 2014-04-05: qty 1

## 2014-04-05 MED ORDER — SODIUM CHLORIDE 0.9 % IJ SOLN
3.0000 mL | Freq: Two times a day (BID) | INTRAMUSCULAR | Status: DC
Start: 2014-04-05 — End: 2014-04-06
  Administered 2014-04-05 – 2014-04-06 (×3): 3 mL via INTRAVENOUS

## 2014-04-05 MED ORDER — ASPIRIN 81 MG PO CHEW
324.0000 mg | CHEWABLE_TABLET | Freq: Once | ORAL | Status: AC
  Administered 2014-04-05: 324 mg via ORAL
  Filled 2014-04-05: qty 4

## 2014-04-05 MED ORDER — NICOTINE 14 MG/24HR TD PT24
14.0000 mg | MEDICATED_PATCH | Freq: Every day | TRANSDERMAL | Status: DC
  Administered 2014-04-05 – 2014-04-06 (×2): 14 mg via TRANSDERMAL
  Filled 2014-04-05 (×2): qty 1

## 2014-04-05 MED ORDER — HEPARIN SODIUM (PORCINE) 5000 UNIT/ML IJ SOLN
5000.0000 [IU] | Freq: Three times a day (TID) | INTRAMUSCULAR | Status: DC
  Administered 2014-04-05 – 2014-04-06 (×5): 5000 [IU] via SUBCUTANEOUS
  Filled 2014-04-05 (×5): qty 1

## 2014-04-05 MED ORDER — GI COCKTAIL ~~LOC~~
30.0000 mL | Freq: Once | ORAL | Status: AC
  Administered 2014-04-05: 30 mL via ORAL
  Filled 2014-04-05: qty 30

## 2014-04-05 MED ORDER — ASPIRIN 81 MG PO CHEW
81.0000 mg | CHEWABLE_TABLET | Freq: Every day | ORAL | Status: DC
  Administered 2014-04-06: 81 mg via ORAL
  Filled 2014-04-05: qty 1

## 2014-04-05 MED ORDER — ACETAMINOPHEN 325 MG PO TABS
650.0000 mg | ORAL_TABLET | Freq: Four times a day (QID) | ORAL | Status: DC | PRN
  Administered 2014-04-05 – 2014-04-06 (×3): 650 mg via ORAL
  Filled 2014-04-05 (×3): qty 2

## 2014-04-05 NOTE — Consult Note (Signed)
CONSULTATION NOTE  Reason for Consult: Chest pain  Requesting Physician: Dr. Eliseo Squires  Cardiologist: None (New)  HPI: This is a 69 y.o. female with a history of dyslipidemia (intolerant to statins), family history of coronary disease with a sister who had an MI in her 57s and mother with coronary disease, and long-time tobacco user. She has not been on medication for hypertension, however recently was seen in a doctor's office for sore throat and was found to be hypertensive with systolic blood pressures in the 180s. She was scheduled to see a regular physician for a physical this week and started taking her blood pressures with her husband blood pressure cuff. The other night she had chest pain and her blood pressure was noted to be 211 systolic, therefore she called EMS. The chest pain improved with better blood pressure control. She did rule out for MI with 2 negative troponins, but still has a "funny feeling" in her chest. CT angiogram was performed which demonstrated no evidence of aortic dissection or aneurysm, but no pulmonary embolism. There are scattered intimal calcifications noted in the vasculature.   PMHx:  Past Medical History  Diagnosis Date  . Hypertensive urgency 04/05/2014   Past Surgical History  Procedure Laterality Date  . Tonsillectomy    . Abdominal hysterectomy    . Tumor removal      FAMHx: History reviewed. No pertinent family history.  SOCHx:  reports that she has been smoking Cigarettes.  She has been smoking about 0.50 packs per day. She does not have any smokeless tobacco history on file. She reports that she does not drink alcohol or use illicit drugs.  ALLERGIES: Allergies  Allergen Reactions  . Allegra [Fexofenadine]   . Amoxapine And Related   . Clindamycin/Lincomycin   . Keflex [Cephalexin]   . Penicillins   . Prednisone   . Statins Other (See Comments)    myalgias  . Sulfa Antibiotics     ROS: A comprehensive review of systems was  negative except for: Cardiovascular: positive for chest pressure/discomfort and hypertensive emergency  HOME MEDICATIONS: Prescriptions prior to admission  Medication Sig Dispense Refill Last Dose  . azithromycin (ZITHROMAX) 250 MG tablet Take 1 tablet by mouth daily. 5 day regimen started 04/01/14   04/04/2014 at Unknown time  . conjugated estrogens (PREMARIN) vaginal cream Place 1 Applicatorful vaginally daily.   Past Month at Unknown time  . diclofenac sodium (VOLTAREN) 1 % GEL Apply 4 g topically 4 (four) times daily.   Past Week at Unknown time  . HYDROcodone-acetaminophen (NORCO/VICODIN) 5-325 MG per tablet Take 1 tablet by mouth every 6 (six) hours as needed for moderate pain.   Past Month at Unknown time  . pseudoephedrine-acetaminophen (TYLENOL SINUS) 30-500 MG TABS Take 1 tablet by mouth every 4 (four) hours as needed.   Past Week at Unknown time    HOSPITAL MEDICATIONS: Scheduled: . amLODipine  5 mg Oral Daily  . azithromycin  250 mg Oral Once  . heparin  5,000 Units Subcutaneous 3 times per day  . nicotine  14 mg Transdermal Daily  . sodium chloride  3 mL Intravenous Q12H    VITALS: Blood pressure 117/68, pulse 77, temperature 97.8 F (36.6 C), temperature source Oral, resp. rate 20, height 5' 8.5" (1.74 m), weight 173 lb 4.8 oz (78.608 kg), SpO2 94 %.  PHYSICAL EXAM: General appearance: alert and no distress Neck: no carotid bruit and no JVD Lungs: clear to auscultation bilaterally Heart: regular rate and rhythm,  S1, S2 normal, no murmur, click, rub or gallop Abdomen: soft, non-tender; bowel sounds normal; no masses,  no organomegaly Extremities: extremities normal, atraumatic, no cyanosis or edema Pulses: 2+ and symmetric Skin: Skin color, texture, turgor normal. No rashes or lesions Neurologic: Grossly normal Psych: Pleasant, normal mood  LABS: Results for orders placed or performed during the hospital encounter of 04/04/14 (from the past 48 hour(s))  CBC with  Differential     Status: Abnormal   Collection Time: 04/04/14  8:44 PM  Result Value Ref Range   WBC 11.5 (H) 4.0 - 10.5 K/uL   RBC 4.25 3.87 - 5.11 MIL/uL   Hemoglobin 14.2 12.0 - 15.0 g/dL   HCT 41.7 36.0 - 46.0 %   MCV 98.1 78.0 - 100.0 fL   MCH 33.4 26.0 - 34.0 pg   MCHC 34.1 30.0 - 36.0 g/dL   RDW 13.3 11.5 - 15.5 %   Platelets 254 150 - 400 K/uL   Neutrophils Relative % 51 43 - 77 %   Lymphocytes Relative 36 12 - 46 %   Monocytes Relative 9 3 - 12 %   Eosinophils Relative 4 0 - 5 %   Basophils Relative 0 0 - 1 %   Neutro Abs 5.9 1.7 - 7.7 K/uL   Lymphs Abs 4.1 (H) 0.7 - 4.0 K/uL   Monocytes Absolute 1.0 0.1 - 1.0 K/uL   Eosinophils Absolute 0.5 0.0 - 0.7 K/uL   Basophils Absolute 0.0 0.0 - 0.1 K/uL  Comprehensive metabolic panel     Status: Abnormal   Collection Time: 04/04/14  8:44 PM  Result Value Ref Range   Sodium 134 (L) 135 - 145 mmol/L    Comment: Please note change in reference range.   Potassium 4.0 3.5 - 5.1 mmol/L    Comment: Please note change in reference range.   Chloride 103 96 - 112 mEq/L   CO2 21 19 - 32 mmol/L   Glucose, Bld 112 (H) 70 - 99 mg/dL   BUN 18 6 - 23 mg/dL   Creatinine, Ser 0.83 0.50 - 1.10 mg/dL   Calcium 9.8 8.4 - 10.5 mg/dL   Total Protein 6.8 6.0 - 8.3 g/dL   Albumin 3.9 3.5 - 5.2 g/dL   AST 27 0 - 37 U/L   ALT 39 (H) 0 - 35 U/L   Alkaline Phosphatase 74 39 - 117 U/L   Total Bilirubin 0.3 0.3 - 1.2 mg/dL   GFR calc non Af Amer 71 (L) >90 mL/min   GFR calc Af Amer 83 (L) >90 mL/min    Comment: (NOTE) The eGFR has been calculated using the CKD EPI equation. This calculation has not been validated in all clinical situations. eGFR's persistently <90 mL/min signify possible Chronic Kidney Disease.    Anion gap 10 5 - 15  D-dimer, quantitative     Status: None   Collection Time: 04/04/14  8:44 PM  Result Value Ref Range   D-Dimer, Quant <0.27 0.00 - 0.48 ug/mL-FEU    Comment:        AT THE INHOUSE ESTABLISHED CUTOFF VALUE OF  0.48 ug/mL FEU, THIS ASSAY HAS BEEN DOCUMENTED IN THE LITERATURE TO HAVE A SENSITIVITY AND NEGATIVE PREDICTIVE VALUE OF AT LEAST 98 TO 99%.  THE TEST RESULT SHOULD BE CORRELATED WITH AN ASSESSMENT OF THE CLINICAL PROBABILITY OF DVT / VTE.   I-stat troponin, ED     Status: None   Collection Time: 04/04/14  8:51 PM  Result Value Ref  Range   Troponin i, poc 0.00 0.00 - 0.08 ng/mL   Comment 3            Comment: Due to the release kinetics of cTnI, a negative result within the first hours of the onset of symptoms does not rule out myocardial infarction with certainty. If myocardial infarction is still suspected, repeat the test at appropriate intervals.   Urinalysis, Routine w reflex microscopic     Status: None   Collection Time: 04/04/14  9:12 PM  Result Value Ref Range   Color, Urine YELLOW YELLOW   APPearance CLEAR CLEAR   Specific Gravity, Urine 1.017 1.005 - 1.030   pH 6.0 5.0 - 8.0   Glucose, UA NEGATIVE NEGATIVE mg/dL   Hgb urine dipstick NEGATIVE NEGATIVE   Bilirubin Urine NEGATIVE NEGATIVE   Ketones, ur NEGATIVE NEGATIVE mg/dL   Protein, ur NEGATIVE NEGATIVE mg/dL   Urobilinogen, UA 0.2 0.0 - 1.0 mg/dL   Nitrite NEGATIVE NEGATIVE   Leukocytes, UA NEGATIVE NEGATIVE    Comment: MICROSCOPIC NOT DONE ON URINES WITH NEGATIVE PROTEIN, BLOOD, LEUKOCYTES, NITRITE, OR GLUCOSE <1000 mg/dL.  I-stat troponin, ED     Status: None   Collection Time: 04/05/14 12:16 AM  Result Value Ref Range   Troponin i, poc 0.00 0.00 - 0.08 ng/mL   Comment 3            Comment: Due to the release kinetics of cTnI, a negative result within the first hours of the onset of symptoms does not rule out myocardial infarction with certainty. If myocardial infarction is still suspected, repeat the test at appropriate intervals.   Troponin I (q 6hr x 3)     Status: None   Collection Time: 04/05/14  1:09 AM  Result Value Ref Range   Troponin I <0.03 <0.031 ng/mL    Comment:        NO INDICATION  OF MYOCARDIAL INJURY. Please note change in reference range.   Troponin I (q 6hr x 3)     Status: None   Collection Time: 04/05/14  7:20 AM  Result Value Ref Range   Troponin I <0.03 <0.031 ng/mL    Comment:        NO INDICATION OF MYOCARDIAL INJURY. Please note change in reference range.   Troponin I (q 6hr x 3)     Status: None   Collection Time: 04/05/14  1:41 PM  Result Value Ref Range   Troponin I <0.03 <0.031 ng/mL    Comment:        NO INDICATION OF MYOCARDIAL INJURY. Please note change in reference range.     IMAGING: Dg Chest Port 1 View  04/04/2014   CLINICAL DATA:  Initial evaluation for sharp substernal chest pain for 2 hr, high blood pressure  EXAM: PORTABLE CHEST - 1 VIEW  COMPARISON:  05/23/2006  FINDINGS: Heart size and vascular pattern are normal. Ectasia of the aorta. Lungs clear. No pleural effusion.  IMPRESSION: No active disease.   Electronically Signed   By: Skipper Cliche M.D.   On: 04/04/2014 21:02   Ct Angio Chest Aorta W/cm &/or Wo/cm  04/04/2014   CLINICAL DATA:  Sharp substernal chest pain around 7 o'clock. Elevated blood pressure tumor 20. Chest pain the radiates to back. Concern for aortic dissection  EXAM: CT ANGIOGRAPHY CHEST, ABDOMEN AND PELVIS  TECHNIQUE: Multidetector CT imaging through the chest, abdomen and pelvis was performed using the standard protocol during bolus administration of intravenous contrast. Multiplanar reconstructed images  and MIPs were obtained and reviewed to evaluate the vascular anatomy.  CONTRAST:  130m OMNIPAQUE IOHEXOL 350 MG/ML SOLN  COMPARISON:  None.  FINDINGS: CTA CHEST FINDINGS  Non IV contrast images demonstrate no density within the aorta to suggest intramural hematoma. There are scattered intimal calcifications. Contrastimages demonstrates no evidence of dissection or aneurysm within the ascending, transverse, or descending thoracic aorta. There is an aberrant right subclavian artery extending posterior to the  esophagus. There is a large mixed density plaque at the origin of the right subclavian artery seen on coronal image 76. There is no evidence of filling defects within the pulmonary arteries to suggest acute pulmonary embolism. No pericardial fluid. The great vessels are otherwise normal.  No pulmonary edema or pleural fluid. There is a lesion in the left lower lobe measuring 10 mm which appears partially calcified therefore is likely benign.  Enlargement of the left lobe of thyroid gland measuring 3.8 x 2.9 cm with central calcifications.  Review of the MIP images confirms the above findings.  CTA ABDOMEN AND PELVIS FINDINGS  No evidence dissection of the abdominal aorta. No evidence of aneurysm of the abdominal aorta or iliac vessels. The celiac trunk and SMA are patent. Bilateral patent renal arteries. The IMA is patent. There are scattered intimal calcifications throughout the abdominal aorta.  No focal hepatic lesion. The gallbladder, pancreas, spleen, adrenal glands, kidneys are normal. The stomach, small bowel, and colon are unremarkable.  No free fluid the pelvis. Post hysterectomy anatomy. The bladder normal. No pelvic lymphadenopathy. No aggressive osseous lesion  Review of the MIP images confirms the above findings.  IMPRESSION: Chest Impression:  1. No evidence of aortic dissection or aneurysm. 2. Aberrant right subclavian artery. This vascular anomaly can cause pain with swallowing (dysphagia lasoria ). 3. Enlargement of the left lobe of the thyroid gland. Consider thyroid ultrasound further evaluation.  Abdomen / Pelvis Impression:  No evidence and aortic aneurysm or dissection.   Electronically Signed   By: SSuzy BouchardM.D.   On: 04/04/2014 23:37   Ct Angio Abd/pel W/ And/or W/o  04/04/2014   CLINICAL DATA:  Sharp substernal chest pain around 7 o'clock. Elevated blood pressure tumor 20. Chest pain the radiates to back. Concern for aortic dissection  EXAM: CT ANGIOGRAPHY CHEST, ABDOMEN AND PELVIS   TECHNIQUE: Multidetector CT imaging through the chest, abdomen and pelvis was performed using the standard protocol during bolus administration of intravenous contrast. Multiplanar reconstructed images and MIPs were obtained and reviewed to evaluate the vascular anatomy.  CONTRAST:  1022mOMNIPAQUE IOHEXOL 350 MG/ML SOLN  COMPARISON:  None.  FINDINGS: CTA CHEST FINDINGS  Non IV contrast images demonstrate no density within the aorta to suggest intramural hematoma. There are scattered intimal calcifications. Contrastimages demonstrates no evidence of dissection or aneurysm within the ascending, transverse, or descending thoracic aorta. There is an aberrant right subclavian artery extending posterior to the esophagus. There is a large mixed density plaque at the origin of the right subclavian artery seen on coronal image 76. There is no evidence of filling defects within the pulmonary arteries to suggest acute pulmonary embolism. No pericardial fluid. The great vessels are otherwise normal.  No pulmonary edema or pleural fluid. There is a lesion in the left lower lobe measuring 10 mm which appears partially calcified therefore is likely benign.  Enlargement of the left lobe of thyroid gland measuring 3.8 x 2.9 cm with central calcifications.  Review of the MIP images confirms the above findings.  CTA  ABDOMEN AND PELVIS FINDINGS  No evidence dissection of the abdominal aorta. No evidence of aneurysm of the abdominal aorta or iliac vessels. The celiac trunk and SMA are patent. Bilateral patent renal arteries. The IMA is patent. There are scattered intimal calcifications throughout the abdominal aorta.  No focal hepatic lesion. The gallbladder, pancreas, spleen, adrenal glands, kidneys are normal. The stomach, small bowel, and colon are unremarkable.  No free fluid the pelvis. Post hysterectomy anatomy. The bladder normal. No pelvic lymphadenopathy. No aggressive osseous lesion  Review of the MIP images confirms the  above findings.  IMPRESSION: Chest Impression:  1. No evidence of aortic dissection or aneurysm. 2. Aberrant right subclavian artery. This vascular anomaly can cause pain with swallowing (dysphagia lasoria ). 3. Enlargement of the left lobe of the thyroid gland. Consider thyroid ultrasound further evaluation.  Abdomen / Pelvis Impression:  No evidence and aortic aneurysm or dissection.   Electronically Signed   By: Suzy Bouchard M.D.   On: 04/04/2014 23:37    HOSPITAL DIAGNOSES: Principal Problem:   Hypertensive urgency Active Problems:   Chest pain   Dyslipidemia   Tobacco abuse   IMPRESSION: 1. Chest pain, possibly unstable angina 2. Hypertensive urgency 3. Dyslipidemia - statin intolerant 4. Tobacco abuse  RECOMMENDATION: 1. Chest pain-given her risk factors and the fact that she has statin intolerant dyslipidemia and uncontrolled hypertension as well as a long-standing tobacco history, I would recommend a nuclear stress test tomorrow. Please keep nothing by mouth after midnight. I would also advise giving her aspirin now and continuing that for the short-term. 2. Hypertensive urgency-blood pressure appears better controlled on current medications. Would recommend ACE or ARB or beta blocker if there is evidence for coronary disease 3. Dyslipidemia-she has a history of statin intolerance. I ordered a lipid profile for tomorrow and we may need to consider alternative treatment options. 4. Tobacco abuse-she is currently on a nicotine patch. We talked about smoking cessation and its importance.  Cardiology will follow along with you. Thanks for the consultation.  Time Spent Directly with Patient: 30 minutes  Pixie Casino, MD, [facility] Attending Cardiologist [facility]  Makayle Krahn C 04/05/2014, 2:57 PM

## 2014-04-05 NOTE — ED Provider Notes (Signed)
EKG Interpretation  Date/Time:  Saturday April 04 2014 20:22:14 EST Ventricular Rate:  88 PR Interval:  153 QRS Duration: 83 QT Interval:  375 QTC Calculation: 454 R Axis:   -51 Text Interpretation:  Sinus rhythm Left anterior fascicular block Low voltage, precordial leads No significant change was found Confirmed by Derenda Giddings  MD, Lennette Bihari ([id number]) on 04/05/2014 1:02:48 AM        Hoy Morn, MD 04/05/14 [phone]

## 2014-04-05 NOTE — ED Notes (Signed)
Alcario Drought, DO at bedside.

## 2014-04-05 NOTE — ED Notes (Signed)
Pt is reporting a headache. Admitting physician paged for orders.

## 2014-04-05 NOTE — H&P (Addendum)
[facility] History and Physical  Margaret Hodges CBJ:[id number] DOB: 29-Sep-1945 DOA: 04/04/2014  Referring physician: EDP PCP: No primary care provider on file.   Chief Complaint: Chest pain   HPI: Margaret Hodges is a 69 y.o. female who presents to the ED with chest pain.  Pain has been intermittent for the past 3 days.  Located in the substernal area with radiation to her back.  She has been checking her BP at home recently due to it being in the 180s at her PCPs office recently.  She is on no medications for HTN and has no history of HTN previous to this.  Today pain became significantly worse and her BP at home was 160 systolic so she called EMS.  EMS got pressure of 194/100.  Patient was taken to ED, in the ED her CP resolved and her BP has been ranging from 130-160.  Review of Systems: Systems reviewed.  As above, otherwise negative  Past Medical History  Diagnosis Date  . Hypertensive urgency 04/05/2014   Past Surgical History  Procedure Laterality Date  . Tonsillectomy    . Abdominal hysterectomy    . Tumor removal     Social History:  reports that she has been smoking Cigarettes.  She has been smoking about 0.50 packs per day. She does not have any smokeless tobacco history on file. She reports that she does not drink alcohol or use illicit drugs.  Not on File  History reviewed. No pertinent family history.   Prior to Admission medications   Not on File   Physical Exam: Filed Vitals:   04/05/14 0030  BP: 133/73  Pulse: 76  Temp:   Resp: 15    BP 133/73 mmHg  Pulse 76  Temp(Src) 97.7 F (36.5 C) (Oral)  Resp 15  Ht 5\' 8"  (1.727 m)  Wt 80.287 kg (177 lb)  BMI 26.92 kg/m2  SpO2 94%  General Appearance:    Alert, oriented, no distress, appears stated age  Head:    Normocephalic, atraumatic  Eyes:    PERRL, EOMI, sclera non-icteric        Nose:   Nares without drainage or epistaxis. Mucosa, turbinates normal  Throat:   Moist mucous membranes.  Oropharynx without erythema or exudate.  Neck:   Supple. No carotid bruits.  No thyromegaly.  No lymphadenopathy.   Back:     No CVA tenderness, no spinal tenderness  Lungs:     Clear to auscultation bilaterally, without wheezes, rhonchi or rales  Chest wall:    No tenderness to palpitation  Heart:    Regular rate and rhythm without murmurs, gallops, rubs  Abdomen:     Soft, non-tender, nondistended, normal bowel sounds, no organomegaly  Genitalia:    deferred  Rectal:    deferred  Extremities:   No clubbing, cyanosis or edema.  Pulses:   2+ and symmetric all extremities  Skin:   Skin color, texture, turgor normal, no rashes or lesions  Lymph nodes:   Cervical, supraclavicular, and axillary nodes normal  Neurologic:   CNII-XII intact. Normal strength, sensation and reflexes      throughout    Labs on Admission:  Basic Metabolic Panel:  Recent Labs Lab 04/04/14 2044  NA 134*  K 4.0  CL 103  CO2 21  GLUCOSE 112*  BUN 18  CREATININE 0.83  CALCIUM 9.8   Liver Function Tests:  Recent Labs Lab 04/04/14 2044  AST 27  ALT 39*  ALKPHOS 74  BILITOT 0.3  PROT 6.8  ALBUMIN 3.9   No results for input(s): LIPASE, AMYLASE in the last 168 hours. No results for input(s): AMMONIA in the last 168 hours. CBC:  Recent Labs Lab 04/04/14 2044  WBC 11.5*  NEUTROABS 5.9  HGB 14.2  HCT 41.7  MCV 98.1  PLT 254   Cardiac Enzymes: No results for input(s): CKTOTAL, CKMB, CKMBINDEX, TROPONINI in the last 168 hours.  BNP (last 3 results) No results for input(s): PROBNP in the last 8760 hours. CBG: No results for input(s): GLUCAP in the last 168 hours.  Radiological Exams on Admission: Dg Chest Port 1 View  04/04/2014   CLINICAL DATA:  Initial evaluation for sharp substernal chest pain for 2 hr, high blood pressure  EXAM: PORTABLE CHEST - 1 VIEW  COMPARISON:  05/23/2006  FINDINGS: Heart size and vascular pattern are normal. Ectasia of the aorta. Lungs clear. No pleural effusion.   IMPRESSION: No active disease.   Electronically Signed   By: Skipper Cliche M.D.   On: 04/04/2014 21:02   Ct Angio Chest Aorta W/cm &/or Wo/cm  04/04/2014   CLINICAL DATA:  Sharp substernal chest pain around 7 o'clock. Elevated blood pressure tumor 20. Chest pain the radiates to back. Concern for aortic dissection  EXAM: CT ANGIOGRAPHY CHEST, ABDOMEN AND PELVIS  TECHNIQUE: Multidetector CT imaging through the chest, abdomen and pelvis was performed using the standard protocol during bolus administration of intravenous contrast. Multiplanar reconstructed images and MIPs were obtained and reviewed to evaluate the vascular anatomy.  CONTRAST:  183mL OMNIPAQUE IOHEXOL 350 MG/ML SOLN  COMPARISON:  None.  FINDINGS: CTA CHEST FINDINGS  Non IV contrast images demonstrate no density within the aorta to suggest intramural hematoma. There are scattered intimal calcifications. Contrastimages demonstrates no evidence of dissection or aneurysm within the ascending, transverse, or descending thoracic aorta. There is an aberrant right subclavian artery extending posterior to the esophagus. There is a large mixed density plaque at the origin of the right subclavian artery seen on coronal image 76. There is no evidence of filling defects within the pulmonary arteries to suggest acute pulmonary embolism. No pericardial fluid. The great vessels are otherwise normal.  No pulmonary edema or pleural fluid. There is a lesion in the left lower lobe measuring 10 mm which appears partially calcified therefore is likely benign.  Enlargement of the left lobe of thyroid gland measuring 3.8 x 2.9 cm with central calcifications.  Review of the MIP images confirms the above findings.  CTA ABDOMEN AND PELVIS FINDINGS  No evidence dissection of the abdominal aorta. No evidence of aneurysm of the abdominal aorta or iliac vessels. The celiac trunk and SMA are patent. Bilateral patent renal arteries. The IMA is patent. There are scattered intimal  calcifications throughout the abdominal aorta.  No focal hepatic lesion. The gallbladder, pancreas, spleen, adrenal glands, kidneys are normal. The stomach, small bowel, and colon are unremarkable.  No free fluid the pelvis. Post hysterectomy anatomy. The bladder normal. No pelvic lymphadenopathy. No aggressive osseous lesion  Review of the MIP images confirms the above findings.  IMPRESSION: Chest Impression:  1. No evidence of aortic dissection or aneurysm. 2. Aberrant right subclavian artery. This vascular anomaly can cause pain with swallowing (dysphagia lasoria ). 3. Enlargement of the left lobe of the thyroid gland. Consider thyroid ultrasound further evaluation.  Abdomen / Pelvis Impression:  No evidence and aortic aneurysm or dissection.   Electronically Signed   By: Suzy Bouchard M.D.   On:  04/04/2014 23:37   Ct Angio Abd/pel W/ And/or W/o  04/04/2014   CLINICAL DATA:  Sharp substernal chest pain around 7 o'clock. Elevated blood pressure tumor 20. Chest pain the radiates to back. Concern for aortic dissection  EXAM: CT ANGIOGRAPHY CHEST, ABDOMEN AND PELVIS  TECHNIQUE: Multidetector CT imaging through the chest, abdomen and pelvis was performed using the standard protocol during bolus administration of intravenous contrast. Multiplanar reconstructed images and MIPs were obtained and reviewed to evaluate the vascular anatomy.  CONTRAST:  157mL OMNIPAQUE IOHEXOL 350 MG/ML SOLN  COMPARISON:  None.  FINDINGS: CTA CHEST FINDINGS  Non IV contrast images demonstrate no density within the aorta to suggest intramural hematoma. There are scattered intimal calcifications. Contrastimages demonstrates no evidence of dissection or aneurysm within the ascending, transverse, or descending thoracic aorta. There is an aberrant right subclavian artery extending posterior to the esophagus. There is a large mixed density plaque at the origin of the right subclavian artery seen on coronal image 76. There is no evidence of  filling defects within the pulmonary arteries to suggest acute pulmonary embolism. No pericardial fluid. The great vessels are otherwise normal.  No pulmonary edema or pleural fluid. There is a lesion in the left lower lobe measuring 10 mm which appears partially calcified therefore is likely benign.  Enlargement of the left lobe of thyroid gland measuring 3.8 x 2.9 cm with central calcifications.  Review of the MIP images confirms the above findings.  CTA ABDOMEN AND PELVIS FINDINGS  No evidence dissection of the abdominal aorta. No evidence of aneurysm of the abdominal aorta or iliac vessels. The celiac trunk and SMA are patent. Bilateral patent renal arteries. The IMA is patent. There are scattered intimal calcifications throughout the abdominal aorta.  No focal hepatic lesion. The gallbladder, pancreas, spleen, adrenal glands, kidneys are normal. The stomach, small bowel, and colon are unremarkable.  No free fluid the pelvis. Post hysterectomy anatomy. The bladder normal. No pelvic lymphadenopathy. No aggressive osseous lesion  Review of the MIP images confirms the above findings.  IMPRESSION: Chest Impression:  1. No evidence of aortic dissection or aneurysm. 2. Aberrant right subclavian artery. This vascular anomaly can cause pain with swallowing (dysphagia lasoria ). 3. Enlargement of the left lobe of the thyroid gland. Consider thyroid ultrasound further evaluation.  Abdomen / Pelvis Impression:  No evidence and aortic aneurysm or dissection.   Electronically Signed   By: Suzy Bouchard M.D.   On: 04/04/2014 23:37    EKG: Independently reviewed. Shows LAFB  Assessment/Plan Principal Problem:   Hypertensive urgency Active Problems:   Chest pain   1. CP - believed to be due to hypertensive urgency 1. Tele monitor 2. Serial trops 2. Hypertensive urgency - new diagnosis of HTN in this patient 1. Will start Norvasc 5mg  PO daily as initial treatment 2. BP in ED at this time is 100  systolic 3. Already has a physical scheduled with PCP for the BP on Tuesday.    Code Status: Full Code  Family Communication: No family in room Disposition Plan: Admit to obs   Time spent: 50 min  GARDNER, JARED M. [facility] Pager [phone]  If 7AM-7PM, please contact the day team taking care of the patient [email or web address] Password [email or web address] 04/05/2014, 1:09 AM

## 2014-04-05 NOTE — Progress Notes (Signed)
Patient admitted after midnight- please see H&P.  BP controlled.  Cards consult for possible ST.  Smoker Eulogio Bear DO

## 2014-04-06 ENCOUNTER — Observation Stay (HOSPITAL_COMMUNITY): Payer: Medicare Other

## 2014-04-06 DIAGNOSIS — E785 Hyperlipidemia, unspecified: Secondary | ICD-10-CM | POA: Diagnosis not present

## 2014-04-06 DIAGNOSIS — R079 Chest pain, unspecified: Secondary | ICD-10-CM | POA: Diagnosis not present

## 2014-04-06 DIAGNOSIS — Z720 Tobacco use: Secondary | ICD-10-CM

## 2014-04-06 DIAGNOSIS — R0789 Other chest pain: Secondary | ICD-10-CM | POA: Diagnosis not present

## 2014-04-06 DIAGNOSIS — I10 Essential (primary) hypertension: Secondary | ICD-10-CM | POA: Diagnosis not present

## 2014-04-06 LAB — LIPID PANEL
Cholesterol: 245 mg/dL — ABNORMAL HIGH (ref 0–200)
HDL: 42 mg/dL (ref >39)
LDL CALC: 157 mg/dL — AB (ref 0–99)
Total CHOL/HDL Ratio: 5.8 RATIO
Triglycerides: 229 mg/dL — ABNORMAL HIGH (ref <150)
VLDL: 46 mg/dL — ABNORMAL HIGH (ref 0–40)

## 2014-04-06 MED ORDER — PANTOPRAZOLE SODIUM 40 MG IV SOLR
40.0000 mg | INTRAVENOUS | Status: DC
  Administered 2014-04-06: 40 mg via INTRAVENOUS
  Filled 2014-04-06: qty 40

## 2014-04-06 MED ORDER — AMLODIPINE BESYLATE 5 MG PO TABS: 5.0000 mg | ORAL_TABLET | Freq: Every day | ORAL | Status: AC

## 2014-04-06 MED ORDER — PANTOPRAZOLE SODIUM 40 MG PO TBEC: 40.0000 mg | DELAYED_RELEASE_TABLET | Freq: Every day | ORAL | Status: DC

## 2014-04-06 MED ORDER — TECHNETIUM TC 99M SESTAMIBI GENERIC - CARDIOLITE
10.0000 | Freq: Once | INTRAVENOUS | Status: AC | PRN
  Administered 2014-04-06: 10 via INTRAVENOUS

## 2014-04-06 MED ORDER — REGADENOSON 0.4 MG/5ML IV SOLN
INTRAVENOUS | Status: AC
  Filled 2014-04-06: qty 5

## 2014-04-06 MED ORDER — REGADENOSON 0.4 MG/5ML IV SOLN
0.4000 mg | Freq: Once | INTRAVENOUS | Status: DC
  Filled 2014-04-06: qty 5

## 2014-04-06 MED ORDER — TECHNETIUM TC 99M SESTAMIBI GENERIC - CARDIOLITE
30.0000 | Freq: Once | INTRAVENOUS | Status: AC | PRN
  Administered 2014-04-06: 30 via INTRAVENOUS

## 2014-04-06 MED ORDER — NICOTINE 14 MG/24HR TD PT24: 14.0000 mg | MEDICATED_PATCH | Freq: Every day | TRANSDERMAL | Status: DC

## 2014-04-06 NOTE — Progress Notes (Signed)
UR completed 

## 2014-04-06 NOTE — Discharge Summary (Signed)
Physician Discharge Summary  Margaret Hodges RSW:[id number] DOB: 02/06/1946 DOA: 04/04/2014  PCP: No primary care provider on file.  Admit date: 04/04/2014 Discharge date: 04/06/2014  Time spent: 35 minutes  Recommendations for Outpatient Follow-up:  1. Thyroid U/s and TSH 2. Smoking cessation  Discharge Diagnoses:  Principal Problem:   Hypertensive urgency Active Problems:   Chest pain   Dyslipidemia   Tobacco abuse   Discharge Condition: improved  Diet recommendation: cardiac  Filed Weights   04/04/14 2022 04/05/14 1105 04/06/14 0552  Weight: 80.287 kg (177 lb) 78.608 kg (173 lb 4.8 oz) 78.245 kg (172 lb 8 oz)    History of present illness:  Margaret Hodges is a 69 y.o. female who presents to the ED with chest pain. Pain has been intermittent for the past 3 days. Located in the substernal area with radiation to her back. She has been checking her BP at home recently due to it being in the 180s at her PCPs office recently. She is on no medications for HTN and has no history of HTN previous to this. Today pain became significantly worse and her BP at home was 093 systolic so she called EMS. EMS got pressure of 194/100. Patient was taken to ED, in the ED her CP resolved and her BP has been ranging from 130-160.   Hospital Course:  Hypertensive urgency BP a lot better. Amlodipine 5  Chest pain Ruled out for MI. Lexiscan low risk.   Dyslipidemia Statin intolerance  Tobacco abuse GERD  Procedures:  Stress test  Consultations:  cards  Discharge Exam: Filed Vitals:   04/06/14 1500  BP: 112/70  Pulse: 101  Temp: 97 F (36.1 C)  Resp: 18      Discharge Instructions    Current Discharge Medication List    START taking these medications   Details  amLODipine (NORVASC) 5 MG tablet Take 1 tablet (5 mg total) by mouth daily. Qty: 30 tablet, Refills: 0    nicotine (NICODERM CQ - DOSED IN MG/24 HOURS) 14 mg/24hr  patch Place 1 patch (14 mg total) onto the skin daily. Qty: 28 patch, Refills: 0    pantoprazole (PROTONIX) 40 MG tablet Take 1 tablet (40 mg total) by mouth daily at 12 noon. Qty: 30 tablet, Refills: 0      CONTINUE these medications which have NOT CHANGED   Details  azithromycin (ZITHROMAX) 250 MG tablet Take 1 tablet by mouth daily. 5 day regimen started 04/01/14    conjugated estrogens (PREMARIN) vaginal cream Place 1 Applicatorful vaginally daily.    diclofenac sodium (VOLTAREN) 1 % GEL Apply 4 g topically 4 (four) times daily.    HYDROcodone-acetaminophen (NORCO/VICODIN) 5-325 MG per tablet Take 1 tablet by mouth every 6 (six) hours as needed for moderate pain.      STOP taking these medications     pseudoephedrine-acetaminophen (TYLENOL SINUS) 30-500 MG TABS        Allergies  Allergen Reactions  . Allegra [Fexofenadine]   . Amoxapine And Related   . Clindamycin/Lincomycin   . Keflex [Cephalexin]   . Penicillins   . Prednisone   . Statins Other (See Comments)    myalgias  . Sulfa Antibiotics       The results of significant diagnostics from this hospitalization (including imaging, microbiology, ancillary and laboratory) are listed below for reference.    Significant Diagnostic Studies: Dg Chest Port 1 View  04/04/2014   CLINICAL DATA:  Initial evaluation for sharp substernal chest pain for  2 hr, high blood pressure  EXAM: PORTABLE CHEST - 1 VIEW  COMPARISON:  05/23/2006  FINDINGS: Heart size and vascular pattern are normal. Ectasia of the aorta. Lungs clear. No pleural effusion.  IMPRESSION: No active disease.   Electronically Signed   By: Skipper Cliche M.D.   On: 04/04/2014 21:02   Ct Angio Chest Aorta W/cm &/or Wo/cm  04/04/2014   CLINICAL DATA:  Sharp substernal chest pain around 7 o'clock. Elevated blood pressure tumor 20. Chest pain the radiates to back. Concern for aortic dissection  EXAM: CT ANGIOGRAPHY CHEST, ABDOMEN AND PELVIS  TECHNIQUE: Multidetector CT  imaging through the chest, abdomen and pelvis was performed using the standard protocol during bolus administration of intravenous contrast. Multiplanar reconstructed images and MIPs were obtained and reviewed to evaluate the vascular anatomy.  CONTRAST:  126mL OMNIPAQUE IOHEXOL 350 MG/ML SOLN  COMPARISON:  None.  FINDINGS: CTA CHEST FINDINGS  Non IV contrast images demonstrate no density within the aorta to suggest intramural hematoma. There are scattered intimal calcifications. Contrastimages demonstrates no evidence of dissection or aneurysm within the ascending, transverse, or descending thoracic aorta. There is an aberrant right subclavian artery extending posterior to the esophagus. There is a large mixed density plaque at the origin of the right subclavian artery seen on coronal image 76. There is no evidence of filling defects within the pulmonary arteries to suggest acute pulmonary embolism. No pericardial fluid. The great vessels are otherwise normal.  No pulmonary edema or pleural fluid. There is a lesion in the left lower lobe measuring 10 mm which appears partially calcified therefore is likely benign.  Enlargement of the left lobe of thyroid gland measuring 3.8 x 2.9 cm with central calcifications.  Review of the MIP images confirms the above findings.  CTA ABDOMEN AND PELVIS FINDINGS  No evidence dissection of the abdominal aorta. No evidence of aneurysm of the abdominal aorta or iliac vessels. The celiac trunk and SMA are patent. Bilateral patent renal arteries. The IMA is patent. There are scattered intimal calcifications throughout the abdominal aorta.  No focal hepatic lesion. The gallbladder, pancreas, spleen, adrenal glands, kidneys are normal. The stomach, small bowel, and colon are unremarkable.  No free fluid the pelvis. Post hysterectomy anatomy. The bladder normal. No pelvic lymphadenopathy. No aggressive osseous lesion  Review of the MIP images confirms the above findings.  IMPRESSION:  Chest Impression:  1. No evidence of aortic dissection or aneurysm. 2. Aberrant right subclavian artery. This vascular anomaly can cause pain with swallowing (dysphagia lasoria ). 3. Enlargement of the left lobe of the thyroid gland. Consider thyroid ultrasound further evaluation.  Abdomen / Pelvis Impression:  No evidence and aortic aneurysm or dissection.   Electronically Signed   By: Suzy Bouchard M.D.   On: 04/04/2014 23:37   Ct Angio Abd/pel W/ And/or W/o  04/04/2014   CLINICAL DATA:  Sharp substernal chest pain around 7 o'clock. Elevated blood pressure tumor 20. Chest pain the radiates to back. Concern for aortic dissection  EXAM: CT ANGIOGRAPHY CHEST, ABDOMEN AND PELVIS  TECHNIQUE: Multidetector CT imaging through the chest, abdomen and pelvis was performed using the standard protocol during bolus administration of intravenous contrast. Multiplanar reconstructed images and MIPs were obtained and reviewed to evaluate the vascular anatomy.  CONTRAST:  159mL OMNIPAQUE IOHEXOL 350 MG/ML SOLN  COMPARISON:  None.  FINDINGS: CTA CHEST FINDINGS  Non IV contrast images demonstrate no density within the aorta to suggest intramural hematoma. There are scattered intimal calcifications. Contrastimages demonstrates  no evidence of dissection or aneurysm within the ascending, transverse, or descending thoracic aorta. There is an aberrant right subclavian artery extending posterior to the esophagus. There is a large mixed density plaque at the origin of the right subclavian artery seen on coronal image 76. There is no evidence of filling defects within the pulmonary arteries to suggest acute pulmonary embolism. No pericardial fluid. The great vessels are otherwise normal.  No pulmonary edema or pleural fluid. There is a lesion in the left lower lobe measuring 10 mm which appears partially calcified therefore is likely benign.  Enlargement of the left lobe of thyroid gland measuring 3.8 x 2.9 cm with central  calcifications.  Review of the MIP images confirms the above findings.  CTA ABDOMEN AND PELVIS FINDINGS  No evidence dissection of the abdominal aorta. No evidence of aneurysm of the abdominal aorta or iliac vessels. The celiac trunk and SMA are patent. Bilateral patent renal arteries. The IMA is patent. There are scattered intimal calcifications throughout the abdominal aorta.  No focal hepatic lesion. The gallbladder, pancreas, spleen, adrenal glands, kidneys are normal. The stomach, small bowel, and colon are unremarkable.  No free fluid the pelvis. Post hysterectomy anatomy. The bladder normal. No pelvic lymphadenopathy. No aggressive osseous lesion  Review of the MIP images confirms the above findings.  IMPRESSION: Chest Impression:  1. No evidence of aortic dissection or aneurysm. 2. Aberrant right subclavian artery. This vascular anomaly can cause pain with swallowing (dysphagia lasoria ). 3. Enlargement of the left lobe of the thyroid gland. Consider thyroid ultrasound further evaluation.  Abdomen / Pelvis Impression:  No evidence and aortic aneurysm or dissection.   Electronically Signed   By: Suzy Bouchard M.D.   On: 04/04/2014 23:37    Microbiology: No results found for this or any previous visit (from the past 240 hour(s)).   Labs: Basic Metabolic Panel:  Recent Labs Lab 04/04/14 2044  NA 134*  K 4.0  CL 103  CO2 21  GLUCOSE 112*  BUN 18  CREATININE 0.83  CALCIUM 9.8   Liver Function Tests:  Recent Labs Lab 04/04/14 2044  AST 27  ALT 39*  ALKPHOS 74  BILITOT 0.3  PROT 6.8  ALBUMIN 3.9   No results for input(s): LIPASE, AMYLASE in the last 168 hours. No results for input(s): AMMONIA in the last 168 hours. CBC:  Recent Labs Lab 04/04/14 2044  WBC 11.5*  NEUTROABS 5.9  HGB 14.2  HCT 41.7  MCV 98.1  PLT 254   Cardiac Enzymes:  Recent Labs Lab 04/05/14 0109 04/05/14 0720 04/05/14 1341  TROPONINI <0.03 <0.03 <0.03   BNP: BNP (last 3 results) No  results for input(s): PROBNP in the last 8760 hours. CBG: No results for input(s): GLUCAP in the last 168 hours.     SignedEulogio Bear  [facility] 04/06/2014, 4:13 PM

## 2014-04-06 NOTE — Progress Notes (Signed)
Discharge instructions/prescitpions given and explained to pt and pt verbalizes understanding of all orders/prescriptions.  IV removed by NT.  VSS. Pt discharged to home in no s/s of distress with all belongings via w/c. Graceann Congress

## 2014-04-06 NOTE — Progress Notes (Signed)
    Subjective Chest burning  Objective: Vital signs in last 24 hours: Temp:  [97.5 F (36.4 C)-97.8 F (36.6 C)] 97.5 F (36.4 C) (01/11 0552) Pulse Rate:  [71-85] 74 (01/11 0552) Resp:  [12-20] 16 (01/11 0552) BP: (108-140)/(59-82) 131/64 mmHg (01/11 0552) SpO2:  [93 %-98 %] 94 % (01/11 0552) Weight:  [172 lb 8 oz (78.245 kg)-173 lb 4.8 oz (78.608 kg)] 172 lb 8 oz (78.245 kg) (01/11 0552) Last BM Date: 04/05/14  Intake/Output from previous day: 01/10 0701 - 01/11 0700 In: 600 [P.O.:600] Out: 1200 [Urine:1200] Intake/Output this shift:    Medications Current Facility-Administered Medications  Medication Dose Route Frequency Provider Last Rate Last Dose  . acetaminophen (TYLENOL) tablet 650 mg  650 mg Oral Q6H PRN Geradine Girt, DO   650 mg at 04/05/14 1348  . amLODipine (NORVASC) tablet 5 mg  5 mg Oral Daily Etta Quill, DO   5 mg at 04/05/14 1025  . aspirin chewable tablet 81 mg  81 mg Oral Daily Pixie Casino, MD      . heparin injection 5,000 Units  5,000 Units Subcutaneous 3 times per day Etta Quill, DO   5,000 Units at 04/06/14 0601  . nicotine (NICODERM CQ - dosed in mg/24 hours) patch 14 mg  14 mg Transdermal Daily Jessica U Vann, DO   14 mg at 04/05/14 1344  . regadenoson (LEXISCAN) injection SOLN 0.4 mg  0.4 mg Intravenous Once Bryan W Hager, PA-C      . sodium chloride (OCEAN) 0.65 % nasal spray 1 spray  1 spray Each Nare PRN Geradine Girt, DO   1 spray at 04/05/14 2122  . sodium chloride 0.9 % injection 3 mL  3 mL Intravenous Q12H Etta Quill, DO   3 mL at 04/05/14 2122    PE: General appearance: alert, cooperative and no distress Lungs: clear to auscultation bilaterally Heart: regular rate and rhythm Extremities: No LEE Pulses: 2+ and symmetric Skin: Warm and dry Neurologic: Grossly normal  Lab Results:   Recent Labs  04/04/14 2044  WBC 11.5*  HGB 14.2  HCT 41.7  PLT 254   BMET  Recent Labs  04/04/14 2044  NA 134*  K 4.0    CL 103  CO2 21  GLUCOSE 112*  BUN 18  CREATININE 0.83  CALCIUM 9.8   PT/INR No results for input(s): LABPROT, INR in the last 72 hours. Cholesterol  Recent Labs  04/06/14 0245  CHOL 245*   Lipid Panel     Component Value Date/Time   CHOL 245* 04/06/2014 0245   TRIG 229* 04/06/2014 0245   HDL 42 04/06/2014 0245   CHOLHDL 5.8 04/06/2014 0245   VLDL 46* 04/06/2014 0245   LDLCALC 157* 04/06/2014 0245    Assessment/Plan     Hypertensive urgency  BP a lot better.  Amlodipine 5,    Chest pain Ruled out for MI.  Lexiscan completed without complications.  Chest burning came back after the test.  Added protonix.Marland Kitchen     Dyslipidemia  Statin intolerance   Tobacco abuse    LOS: 2 days    HAGER, BRYAN PA-C 04/06/2014 9:00 AM  Personally seen and examined. Agree with above. Treating HTN Awaiting stress results.  Candee Furbish, MD

## 2014-04-14 DIAGNOSIS — K219 Gastro-esophageal reflux disease without esophagitis: Secondary | ICD-10-CM | POA: Diagnosis not present

## 2014-04-14 DIAGNOSIS — E785 Hyperlipidemia, unspecified: Secondary | ICD-10-CM | POA: Diagnosis not present

## 2014-04-14 DIAGNOSIS — M1990 Unspecified osteoarthritis, unspecified site: Secondary | ICD-10-CM | POA: Diagnosis not present

## 2014-04-14 DIAGNOSIS — E559 Vitamin D deficiency, unspecified: Secondary | ICD-10-CM | POA: Diagnosis not present

## 2014-04-14 DIAGNOSIS — Z79899 Other long term (current) drug therapy: Secondary | ICD-10-CM | POA: Diagnosis not present

## 2014-04-14 DIAGNOSIS — K640 First degree hemorrhoids: Secondary | ICD-10-CM | POA: Diagnosis not present

## 2014-04-14 DIAGNOSIS — M899 Disorder of bone, unspecified: Secondary | ICD-10-CM | POA: Diagnosis not present

## 2014-04-14 DIAGNOSIS — E049 Nontoxic goiter, unspecified: Secondary | ICD-10-CM | POA: Diagnosis not present

## 2014-04-14 DIAGNOSIS — Z720 Tobacco use: Secondary | ICD-10-CM | POA: Diagnosis not present

## 2014-04-14 DIAGNOSIS — G610 Guillain-Barre syndrome: Secondary | ICD-10-CM | POA: Diagnosis not present

## 2014-04-14 DIAGNOSIS — K589 Irritable bowel syndrome without diarrhea: Secondary | ICD-10-CM | POA: Diagnosis not present

## 2014-04-14 DIAGNOSIS — I700 Atherosclerosis of aorta: Secondary | ICD-10-CM | POA: Diagnosis not present

## 2014-04-27 ENCOUNTER — Other Ambulatory Visit: Payer: Self-pay | Admitting: Family Medicine

## 2014-04-27 DIAGNOSIS — E049 Nontoxic goiter, unspecified: Secondary | ICD-10-CM

## 2014-04-30 ENCOUNTER — Ambulatory Visit
Admission: RE | Admit: 2014-04-30 | Discharge: 2014-04-30 | Disposition: A | Discharge status: Home / Self Care | Payer: Medicare Other | Source: Ambulatory Visit | Attending: Family Medicine | Admitting: Family Medicine

## 2014-04-30 DIAGNOSIS — E042 Nontoxic multinodular goiter: Secondary | ICD-10-CM | POA: Diagnosis not present

## 2014-04-30 DIAGNOSIS — E049 Nontoxic goiter, unspecified: Secondary | ICD-10-CM

## 2014-05-07 DIAGNOSIS — K589 Irritable bowel syndrome without diarrhea: Secondary | ICD-10-CM | POA: Diagnosis not present

## 2014-05-07 DIAGNOSIS — M899 Disorder of bone, unspecified: Secondary | ICD-10-CM | POA: Diagnosis not present

## 2014-05-07 DIAGNOSIS — G610 Guillain-Barre syndrome: Secondary | ICD-10-CM | POA: Diagnosis not present

## 2014-05-07 DIAGNOSIS — K219 Gastro-esophageal reflux disease without esophagitis: Secondary | ICD-10-CM | POA: Diagnosis not present

## 2014-05-07 DIAGNOSIS — E049 Nontoxic goiter, unspecified: Secondary | ICD-10-CM | POA: Diagnosis not present

## 2014-05-07 DIAGNOSIS — I700 Atherosclerosis of aorta: Secondary | ICD-10-CM | POA: Diagnosis not present

## 2014-05-07 DIAGNOSIS — Z720 Tobacco use: Secondary | ICD-10-CM | POA: Diagnosis not present

## 2014-05-07 DIAGNOSIS — E785 Hyperlipidemia, unspecified: Secondary | ICD-10-CM | POA: Diagnosis not present

## 2014-05-07 DIAGNOSIS — K640 First degree hemorrhoids: Secondary | ICD-10-CM | POA: Diagnosis not present

## 2014-05-07 DIAGNOSIS — Z79899 Other long term (current) drug therapy: Secondary | ICD-10-CM | POA: Diagnosis not present

## 2014-05-07 DIAGNOSIS — M1990 Unspecified osteoarthritis, unspecified site: Secondary | ICD-10-CM | POA: Diagnosis not present

## 2014-05-07 DIAGNOSIS — E559 Vitamin D deficiency, unspecified: Secondary | ICD-10-CM | POA: Diagnosis not present

## 2014-07-14 DIAGNOSIS — H524 Presbyopia: Secondary | ICD-10-CM | POA: Diagnosis not present

## 2014-07-14 DIAGNOSIS — H2513 Age-related nuclear cataract, bilateral: Secondary | ICD-10-CM | POA: Diagnosis not present

## 2014-07-14 DIAGNOSIS — H35033 Hypertensive retinopathy, bilateral: Secondary | ICD-10-CM | POA: Diagnosis not present

## 2014-09-08 DIAGNOSIS — M1990 Unspecified osteoarthritis, unspecified site: Secondary | ICD-10-CM | POA: Diagnosis not present

## 2014-09-08 DIAGNOSIS — E785 Hyperlipidemia, unspecified: Secondary | ICD-10-CM | POA: Diagnosis not present

## 2014-09-08 DIAGNOSIS — Z0000 Encounter for general adult medical examination without abnormal findings: Secondary | ICD-10-CM | POA: Diagnosis not present

## 2014-09-08 DIAGNOSIS — E559 Vitamin D deficiency, unspecified: Secondary | ICD-10-CM | POA: Diagnosis not present

## 2014-09-08 DIAGNOSIS — E049 Nontoxic goiter, unspecified: Secondary | ICD-10-CM | POA: Diagnosis not present

## 2014-09-08 DIAGNOSIS — Z79899 Other long term (current) drug therapy: Secondary | ICD-10-CM | POA: Diagnosis not present

## 2014-12-01 DIAGNOSIS — R194 Change in bowel habit: Secondary | ICD-10-CM | POA: Diagnosis not present

## 2014-12-01 DIAGNOSIS — K5730 Diverticulosis of large intestine without perforation or abscess without bleeding: Secondary | ICD-10-CM | POA: Diagnosis not present

## 2014-12-01 DIAGNOSIS — Z1211 Encounter for screening for malignant neoplasm of colon: Secondary | ICD-10-CM | POA: Diagnosis not present

## 2014-12-03 DIAGNOSIS — R350 Frequency of micturition: Secondary | ICD-10-CM | POA: Diagnosis not present

## 2014-12-03 DIAGNOSIS — R197 Diarrhea, unspecified: Secondary | ICD-10-CM | POA: Diagnosis not present

## 2014-12-03 DIAGNOSIS — E041 Nontoxic single thyroid nodule: Secondary | ICD-10-CM | POA: Diagnosis not present

## 2014-12-03 DIAGNOSIS — E049 Nontoxic goiter, unspecified: Secondary | ICD-10-CM | POA: Diagnosis not present

## 2014-12-04 ENCOUNTER — Other Ambulatory Visit (HOSPITAL_COMMUNITY): Payer: Self-pay | Admitting: Family Medicine

## 2014-12-04 DIAGNOSIS — R197 Diarrhea, unspecified: Secondary | ICD-10-CM | POA: Diagnosis not present

## 2014-12-04 DIAGNOSIS — E041 Nontoxic single thyroid nodule: Secondary | ICD-10-CM

## 2014-12-04 DIAGNOSIS — E0590 Thyrotoxicosis, unspecified without thyrotoxic crisis or storm: Secondary | ICD-10-CM

## 2014-12-15 ENCOUNTER — Encounter (HOSPITAL_COMMUNITY)
Admission: RE | Admit: 2014-12-15 | Discharge: 2014-12-15 | Disposition: A | Discharge status: Home / Self Care | Payer: Medicare Other | Source: Ambulatory Visit | Attending: Family Medicine | Admitting: Family Medicine

## 2014-12-15 DIAGNOSIS — E041 Nontoxic single thyroid nodule: Secondary | ICD-10-CM | POA: Insufficient documentation

## 2014-12-15 DIAGNOSIS — E0590 Thyrotoxicosis, unspecified without thyrotoxic crisis or storm: Secondary | ICD-10-CM | POA: Insufficient documentation

## 2014-12-16 ENCOUNTER — Encounter (HOSPITAL_COMMUNITY)
Admission: RE | Admit: 2014-12-16 | Discharge: 2014-12-16 | Disposition: A | Discharge status: Home / Self Care | Payer: Medicare Other | Source: Ambulatory Visit | Attending: Family Medicine | Admitting: Family Medicine

## 2014-12-16 DIAGNOSIS — E041 Nontoxic single thyroid nodule: Secondary | ICD-10-CM | POA: Diagnosis not present

## 2014-12-16 DIAGNOSIS — E042 Nontoxic multinodular goiter: Secondary | ICD-10-CM | POA: Diagnosis not present

## 2014-12-16 DIAGNOSIS — E0590 Thyrotoxicosis, unspecified without thyrotoxic crisis or storm: Secondary | ICD-10-CM | POA: Diagnosis not present

## 2014-12-16 MED ORDER — SODIUM PERTECHNETATE TC 99M INJECTION
10.0000 | Freq: Once | INTRAVENOUS | Status: AC | PRN
  Administered 2014-12-16: 10.25 via INTRAVENOUS

## 2014-12-16 MED ORDER — SODIUM IODIDE I 131 CAPSULE
8.0000 | Freq: Once | INTRAVENOUS | Status: AC | PRN
  Administered 2014-12-15: 8 via ORAL

## 2014-12-22 DIAGNOSIS — J320 Chronic maxillary sinusitis: Secondary | ICD-10-CM | POA: Diagnosis not present

## 2014-12-22 DIAGNOSIS — H9041 Sensorineural hearing loss, unilateral, right ear, with unrestricted hearing on the contralateral side: Secondary | ICD-10-CM | POA: Diagnosis not present

## 2014-12-22 DIAGNOSIS — H6522 Chronic serous otitis media, left ear: Secondary | ICD-10-CM | POA: Diagnosis not present

## 2014-12-22 DIAGNOSIS — H6122 Impacted cerumen, left ear: Secondary | ICD-10-CM | POA: Diagnosis not present

## 2014-12-22 DIAGNOSIS — J040 Acute laryngitis: Secondary | ICD-10-CM | POA: Diagnosis not present

## 2014-12-22 DIAGNOSIS — J322 Chronic ethmoidal sinusitis: Secondary | ICD-10-CM | POA: Diagnosis not present

## 2014-12-22 DIAGNOSIS — J321 Chronic frontal sinusitis: Secondary | ICD-10-CM | POA: Diagnosis not present

## 2014-12-28 ENCOUNTER — Ambulatory Visit: Payer: Self-pay | Admitting: Surgery

## 2014-12-28 DIAGNOSIS — E0590 Thyrotoxicosis, unspecified without thyrotoxic crisis or storm: Secondary | ICD-10-CM | POA: Diagnosis not present

## 2014-12-28 DIAGNOSIS — E042 Nontoxic multinodular goiter: Secondary | ICD-10-CM | POA: Diagnosis not present

## 2015-01-05 DIAGNOSIS — J322 Chronic ethmoidal sinusitis: Secondary | ICD-10-CM | POA: Diagnosis not present

## 2015-01-05 DIAGNOSIS — J040 Acute laryngitis: Secondary | ICD-10-CM | POA: Diagnosis not present

## 2015-01-05 DIAGNOSIS — J039 Acute tonsillitis, unspecified: Secondary | ICD-10-CM | POA: Diagnosis not present

## 2015-01-05 DIAGNOSIS — J320 Chronic maxillary sinusitis: Secondary | ICD-10-CM | POA: Diagnosis not present

## 2015-01-12 DIAGNOSIS — J040 Acute laryngitis: Secondary | ICD-10-CM | POA: Diagnosis not present

## 2015-01-12 DIAGNOSIS — J039 Acute tonsillitis, unspecified: Secondary | ICD-10-CM | POA: Diagnosis not present

## 2015-01-26 DIAGNOSIS — J301 Allergic rhinitis due to pollen: Secondary | ICD-10-CM | POA: Diagnosis not present

## 2015-01-26 DIAGNOSIS — J039 Acute tonsillitis, unspecified: Secondary | ICD-10-CM | POA: Diagnosis not present

## 2015-01-26 DIAGNOSIS — J3081 Allergic rhinitis due to animal (cat) (dog) hair and dander: Secondary | ICD-10-CM | POA: Diagnosis not present

## 2015-01-26 DIAGNOSIS — J040 Acute laryngitis: Secondary | ICD-10-CM | POA: Diagnosis not present

## 2015-01-26 NOTE — Patient Instructions (Addendum)
YOUR PROCEDURE IS SCHEDULED ON : 02/04/15  REPORT TO [place] [facility] MAIN ENTRANCE FOLLOW SIGNS TO EAST ELEVATOR - GO TO 3rd FLOOR CHECK IN AT 3 EAST NURSES STATION (SHORT STAY) AT:  5:30 AM  CALL THIS NUMBER IF YOU HAVE PROBLEMS THE MORNING OF SURGERY [phone]  REMEMBER:ONLY 1 PER PERSON MAY GO TO SHORT STAY WITH YOU TO GET READY THE MORNING OF YOUR SURGERY  DO NOT EAT FOOD OR DRINK LIQUIDS AFTER MIDNIGHT  TAKE THESE MEDICINES THE MORNING OF SURGERY: Amlodipine (Norvasc), Prilosec  STOP ASPIRIN / IBUPROFEN / ALEVE / VITAMINS / HERBAL MEDS __5__ DAYS BEFORE SURGERY  YOU MAY NOT HAVE ANY METAL ON YOUR BODY INCLUDING HAIR PINS AND PIERCING'S. DO NOT WEAR JEWELRY, MAKEUP, LOTIONS, POWDERS OR PERFUMES. DO NOT WEAR NAIL POLISH. DO NOT SHAVE 48 HRS PRIOR TO SURGERY.   DO NOT [place]. [facility] IS NOT RESPONSIBLE FOR VALUABLES.  CONTACTS, DENTURES OR PARTIALS MAY NOT BE WORN TO SURGERY. LEAVE SUITCASE IN CAR. CAN BE BROUGHT TO ROOM AFTER SURGERY.  Special Instructions:  Coughing and deep breathing exercises, leg exercises  PLEASE READ OVER THE FOLLOWING INSTRUCTION SHEETS _________________________________________________________________________________                                           - PREPARING FOR SURGERY  Before surgery, you can play an important role.  Because skin is not sterile, your skin needs to be as free of germs as possible.  You can reduce the number of germs on your skin by washing with CHG (chlorahexidine gluconate) soap before surgery.  CHG is an antiseptic cleaner which kills germs and bonds with the skin to continue killing germs even after washing. Please DO NOT use if you have an allergy to CHG or antibacterial soaps.  If your skin becomes reddened/irritated stop using the CHG and inform your nurse when you arrive at Short Stay. Do not shave (including legs and underarms) for at least 48 hours prior to the  first CHG shower.  You may shave your face. Please follow these instructions carefully:   1.  Shower with CHG Soap the night before surgery and the  morning of Surgery.   2.  If you choose to wash your hair, wash your hair first as usual with your  normal  Shampoo.   3.  After you shampoo, rinse your hair and body thoroughly to remove the  shampoo.                                         4.  Use CHG as you would any other liquid soap.  You can apply chg directly  to the skin and wash . Gently wash with scrungie or clean wascloth    5.  Apply the CHG Soap to your body ONLY FROM THE NECK DOWN.   Do not use on open                           Wound or open sores. Avoid contact with eyes, ears mouth and genitals (private parts).  Genitals (private parts) with your normal soap.              6.  Wash thoroughly, paying special attention to the area where your surgery  will be performed.   7.  Thoroughly rinse your body with warm water from the neck down.   8.  DO NOT shower/wash with your normal soap after using and rinsing off  the CHG Soap .                9.  Pat yourself dry with a clean towel.             10.  Wear clean night clothes to bed after shower             11.  Place clean sheets on your bed the night of your first shower and do not  sleep with pets.  Day of Surgery : Do not apply any lotions/deodorants the morning of surgery.  Please wear clean clothes to the hospital/surgery center.  FAILURE TO FOLLOW THESE INSTRUCTIONS MAY RESULT IN THE CANCELLATION OF YOUR SURGERY    PATIENT SIGNATURE_________________________________  ______________________________________________________________________

## 2015-01-28 ENCOUNTER — Ambulatory Visit (HOSPITAL_COMMUNITY)
Admission: RE | Admit: 2015-01-28 | Discharge: 2015-01-28 | Disposition: A | Discharge status: Home / Self Care | Payer: Medicare Other | Source: Ambulatory Visit | Attending: Anesthesiology | Admitting: Anesthesiology

## 2015-01-28 ENCOUNTER — Encounter (HOSPITAL_COMMUNITY)
Admission: RE | Admit: 2015-01-28 | Discharge: 2015-01-28 | Disposition: A | Discharge status: Home / Self Care | Payer: Medicare Other | Source: Ambulatory Visit | Attending: Surgery | Admitting: Surgery

## 2015-01-28 ENCOUNTER — Encounter (HOSPITAL_COMMUNITY): Payer: Self-pay

## 2015-01-28 DIAGNOSIS — Z01818 Encounter for other preprocedural examination: Secondary | ICD-10-CM

## 2015-01-28 DIAGNOSIS — Z01812 Encounter for preprocedural laboratory examination: Secondary | ICD-10-CM | POA: Insufficient documentation

## 2015-01-28 DIAGNOSIS — Z8701 Personal history of pneumonia (recurrent): Secondary | ICD-10-CM | POA: Diagnosis not present

## 2015-01-28 DIAGNOSIS — E0500 Thyrotoxicosis with diffuse goiter without thyrotoxic crisis or storm: Secondary | ICD-10-CM | POA: Insufficient documentation

## 2015-01-28 DIAGNOSIS — F17200 Nicotine dependence, unspecified, uncomplicated: Secondary | ICD-10-CM | POA: Diagnosis not present

## 2015-01-28 DIAGNOSIS — I10 Essential (primary) hypertension: Secondary | ICD-10-CM | POA: Insufficient documentation

## 2015-01-28 HISTORY — DX: Myoneural disorder, unspecified: G70.9

## 2015-01-28 HISTORY — DX: Gastro-esophageal reflux disease without esophagitis: K21.9

## 2015-01-28 HISTORY — DX: Personal history of other diseases of the digestive system: Z87.19

## 2015-01-28 HISTORY — DX: Angina pectoris, unspecified: I20.9

## 2015-01-28 HISTORY — DX: Headache: R51

## 2015-01-28 HISTORY — DX: Headache, unspecified: R51.9

## 2015-01-28 HISTORY — DX: Pneumonia, unspecified organism: J18.9

## 2015-01-28 LAB — BASIC METABOLIC PANEL
ANION GAP: 8 (ref 5–15)
BUN: 14 mg/dL (ref 6–20)
CALCIUM: 10.2 mg/dL (ref 8.9–10.3)
CHLORIDE: 107 mmol/L (ref 101–111)
CO2: 26 mmol/L (ref 22–32)
CREATININE: 0.75 mg/dL (ref 0.44–1.00)
Glucose, Bld: 97 mg/dL (ref 65–99)
POTASSIUM: 4.8 mmol/L (ref 3.5–5.1)
SODIUM: 141 mmol/L (ref 135–145)

## 2015-01-28 LAB — CBC
HCT: 44.7 % (ref 36.0–46.0)
HEMOGLOBIN: 14.8 g/dL (ref 12.0–15.0)
MCH: 33.6 pg (ref 26.0–34.0)
MCHC: 33.1 g/dL (ref 30.0–36.0)
MCV: 101.4 fL — ABNORMAL HIGH (ref 78.0–100.0)
Platelets: 304 10*3/uL (ref 150–400)
RBC: 4.41 MIL/uL (ref 3.87–5.11)
RDW: 14.2 % (ref 11.5–15.5)
WBC: 11.6 10*3/uL — AB (ref 4.0–10.5)

## 2015-01-28 NOTE — Progress Notes (Addendum)
04-06-14 - Stress Test - EPIC 04-04-14 - EKG - in chart

## 2015-02-03 ENCOUNTER — Encounter (HOSPITAL_COMMUNITY): Payer: Self-pay | Admitting: Surgery

## 2015-02-03 DIAGNOSIS — E0590 Thyrotoxicosis, unspecified without thyrotoxic crisis or storm: Secondary | ICD-10-CM | POA: Diagnosis present

## 2015-02-03 DIAGNOSIS — E042 Nontoxic multinodular goiter: Secondary | ICD-10-CM | POA: Diagnosis present

## 2015-02-03 NOTE — H&P (Signed)
General Surgery [facility]  Margaret Hodges DOB: 1946-03-11 Married / Language: English / Race: White Female  History of Present Illness Patient words: Evaluate thyroid.  The patient is a 69 year old female who presents with a thyroid nodule. Patient referred by Dr. [name] for evaluation of multinodular thyroid with borderline hyperthyroidism. Patient has had a long-standing thyroid goiter with multiple nodules. She has had testing dating back approximately 20 years. This included her last biopsy of the dominant left-sided thyroid nodule in 2004 which was reportedly benign. Recent TSH levels are borderline low at 0.53. Patient has had recent episodes with weight loss and diarrhea which she attributes to her thyroid. She has had a recent thyroid ultrasound in February 2016 showing the right lobe measuring 5.8 cm and containing multiple subcentimeter nodules. Left lobe was enlarged at 6.4 cm and contained a dominant nodule measuring 5.6 x 3.0 x 2.8 cm and containing multiple calcifications. There was no lymphadenopathy. Patient has had no prior head or neck surgery. There is no family history of thyroid cancer. There is no family history of other endocrine neoplasms. Patient did take Synthroid many years ago for a few months in hopes of suppressing her thyroid nodules. She has not been on Synthroid for many years. Patient denies tremors and denies palpitations. She denies dysphagia and dyspnea.  Other Problems Arthritis Gastroesophageal Reflux Disease High blood pressure Hypercholesterolemia Oophorectomy Bilateral. Thyroid Disease  Past Surgical History Appendectomy Hysterectomy (due to cancer) - Complete Knee Surgery Right. Oral Surgery Tonsillectomy  Diagnostic Studies History Mammogram within last year Pap Smear 1-5 years ago  Allergies Allegra *ANTIHISTAMINES* Amoxapine *ANTIDEPRESSANTS* Clindamycin HCl *Anti-infective  Agents - Misc.** Keflex *CEPHALOSPORINS* PredniSONE *CORTICOSTEROIDS* Penicillin V *PENICILLINS* Statins Depletion *DIETARY PRODUCTS/DIETARY MANAGEMENT PRODUCTS* SulfADIAZINE *SULFONAMIDES*  Medication History Hydrocodone-Acetaminophen (5-325MG  Tablet, Oral) Active. Vitamin D (Ergocalciferol) (50000UNIT Capsule, Oral) Active. Azithromycin (500MG  Tablet, Oral) Active. AmLODIPine Besylate (5MG  Tablet, Oral) Active. PriLOSEC (20MG  Capsule DR, Oral) Active. Premarin (0.625MG /GM Cream, Vaginal) Active. Medications Reconciled  Social History Caffeine use Carbonated beverages, Coffee. No alcohol use Tobacco use Current every day smoker.  Family History Alcohol Abuse Brother. Cancer Brother. Heart Disease Mother, Sister. Heart disease in female family member before age 56 Hypertension Mother. Kidney Disease Mother. Ovarian Cancer Sister. Respiratory Condition Sister.  Pregnancy / Birth History Age at menarche 31 years. Gravida 2 Maternal age 55-25 Para 2  Review of Systems General Not Present- Appetite Loss, Chills, Fatigue, Fever, Night Sweats, Weight Gain and Weight Loss. Skin Not Present- Change in Wart/Mole, Dryness, Hives, Jaundice, New Lesions, Non-Healing Wounds, Rash and Ulcer. HEENT Present- Hearing Loss, Sinus Pain and Wears glasses/contact lenses. Not Present- Earache, Hoarseness, Nose Bleed, Oral Ulcers, Ringing in the Ears, Seasonal Allergies, Sore Throat, Visual Disturbances and Yellow Eyes. Respiratory Not Present- Bloody sputum, Chronic Cough, Difficulty Breathing, Snoring and Wheezing. Breast Not Present- Breast Mass, Breast Pain, Nipple Discharge and Skin Changes. Cardiovascular Not Present- Chest Pain, Difficulty Breathing Lying Down, Leg Cramps, Palpitations, Rapid Heart Rate, Shortness of Breath and Swelling of Extremities. Gastrointestinal Not Present- Abdominal Pain, Bloating, Bloody Stool, Change in Bowel Habits, Chronic  diarrhea, Constipation, Difficulty Swallowing, Excessive gas, Gets full quickly at meals, Hemorrhoids, Indigestion, Nausea, Rectal Pain and Vomiting. Female Genitourinary Not Present- Frequency, Nocturia, Painful Urination, Pelvic Pain and Urgency. Musculoskeletal Present- Back Pain, Joint Pain and Muscle Pain. Not Present- Joint Stiffness, Muscle Weakness and Swelling of Extremities. Neurological Not Present- Decreased Memory, Fainting, Headaches, Numbness, Seizures, Tingling, Tremor, Trouble walking and Weakness.  Psychiatric Not Present- Anxiety, Bipolar, Change in Sleep Pattern, Depression, Fearful and Frequent crying. Endocrine Not Present- Cold Intolerance, Excessive Hunger, Hair Changes, Heat Intolerance, Hot flashes and New Diabetes. Hematology Not Present- Easy Bruising, Excessive bleeding, Gland problems, HIV and Persistent Infections.  Vitals Weight: 163.4 lb Temp.: 97.44F(Temporal)  Pulse: 80 (Regular)  Resp.: 16 (Unlabored)  BP: 132/84 (Sitting, Left Arm, Standard)  Physical Exam  General - appears comfortable, no distress; not diaphorectic  HEENT - normocephalic; sclerae clear, gaze conjugate; mucous membranes moist, dentition good; voice normal  Neck - asymmetric on extension; no palpable anterior or posterior cervical adenopathy; palpable dominant mass occupying most of the left thyroid lobe, smooth, firm, mobile with swallowing and extending beneath the left clavicle; right lobe without palpable abnormality  Chest - clear bilaterally without rhonchi, rales, or wheeze  Cor - regular rhythm with normal rate; no significant murmur  Ext - non-tender without significant edema or lymphedema  Neuro - grossly intact; no tremor   Assessment & Plan  MULTINODULAR THYROID (E04.2) HYPERTHYROIDISM, SUBCLINICAL (E05.90)  Pt Education - Pamphlet Given - The Thyroid Book: discussed with patient and provided information.  Patient is referred by her primary care physician  for consideration for total thyroidectomy for management of multi-nodular thyroid and suspected episodes of hyperthyroidism. Patient is provided with written literature on thyroid surgery to review at home.  Patient discussed the indications for thyroidectomy. While there are no absolute indications for thyroidectomy in her case, I believe she would benefit from thyroidectomy allowing her better control of her thyroid hormone levels and avoiding continued assessment of bilateral thyroid nodules. We discussed observation with repeat biopsy of the dominant left thyroid nodule. This has not been biopsied since 2004. Patient wishes to avoid an additional biopsy.  I have recommended total thyroidectomy. We discussed the risk and benefits of the procedure including the risk of recurrent laryngeal nerve injury and injury to parathyroid glands. We discussed the hospital stay to be anticipated. We discussed the location of the surgical incision. We discussed her postoperative recovery and return to activity. We discussed the need for lifelong thyroid hormone replacement. Patient understands and wishes to proceed with surgery in the near future.  The risks and benefits of the procedure have been discussed at length with the patient. The patient understands the proposed procedure, potential alternative treatments, and the course of recovery to be expected. All of the patient's questions have been answered at this time. The patient wishes to proceed with surgery.  Earnstine Regal, MD, [facility] Office: [phone]

## 2015-02-04 ENCOUNTER — Encounter (HOSPITAL_COMMUNITY): Payer: Self-pay | Admitting: *Deleted

## 2015-02-04 ENCOUNTER — Ambulatory Visit (HOSPITAL_COMMUNITY): Payer: Medicare Other | Admitting: Anesthesiology

## 2015-02-04 ENCOUNTER — Encounter (HOSPITAL_COMMUNITY)
Admission: RE | Disposition: A | Discharge status: Home / Self Care | Payer: Self-pay | Source: Ambulatory Visit | Attending: Surgery

## 2015-02-04 ENCOUNTER — Observation Stay (HOSPITAL_COMMUNITY)
Admission: RE | Admit: 2015-02-04 | Discharge: 2015-02-05 | Disposition: A | Discharge status: Home / Self Care | Payer: Medicare Other | Source: Ambulatory Visit | Attending: Surgery | Admitting: Surgery

## 2015-02-04 DIAGNOSIS — E041 Nontoxic single thyroid nodule: Secondary | ICD-10-CM | POA: Diagnosis present

## 2015-02-04 DIAGNOSIS — E0590 Thyrotoxicosis, unspecified without thyrotoxic crisis or storm: Secondary | ICD-10-CM | POA: Diagnosis present

## 2015-02-04 DIAGNOSIS — E7800 Pure hypercholesterolemia, unspecified: Secondary | ICD-10-CM | POA: Diagnosis not present

## 2015-02-04 DIAGNOSIS — Z79890 Hormone replacement therapy (postmenopausal): Secondary | ICD-10-CM | POA: Diagnosis not present

## 2015-02-04 DIAGNOSIS — Z79899 Other long term (current) drug therapy: Secondary | ICD-10-CM | POA: Diagnosis not present

## 2015-02-04 DIAGNOSIS — Z79891 Long term (current) use of opiate analgesic: Secondary | ICD-10-CM | POA: Insufficient documentation

## 2015-02-04 DIAGNOSIS — K449 Diaphragmatic hernia without obstruction or gangrene: Secondary | ICD-10-CM | POA: Diagnosis not present

## 2015-02-04 DIAGNOSIS — E042 Nontoxic multinodular goiter: Secondary | ICD-10-CM | POA: Diagnosis not present

## 2015-02-04 DIAGNOSIS — K219 Gastro-esophageal reflux disease without esophagitis: Secondary | ICD-10-CM | POA: Insufficient documentation

## 2015-02-04 DIAGNOSIS — G709 Myoneural disorder, unspecified: Secondary | ICD-10-CM | POA: Insufficient documentation

## 2015-02-04 DIAGNOSIS — M1990 Unspecified osteoarthritis, unspecified site: Secondary | ICD-10-CM | POA: Diagnosis not present

## 2015-02-04 DIAGNOSIS — I10 Essential (primary) hypertension: Secondary | ICD-10-CM | POA: Diagnosis not present

## 2015-02-04 DIAGNOSIS — E0500 Thyrotoxicosis with diffuse goiter without thyrotoxic crisis or storm: Secondary | ICD-10-CM | POA: Diagnosis not present

## 2015-02-04 DIAGNOSIS — E0580 Other thyrotoxicosis without thyrotoxic crisis or storm: Secondary | ICD-10-CM | POA: Diagnosis not present

## 2015-02-04 DIAGNOSIS — E049 Nontoxic goiter, unspecified: Secondary | ICD-10-CM | POA: Diagnosis not present

## 2015-02-04 DIAGNOSIS — F17200 Nicotine dependence, unspecified, uncomplicated: Secondary | ICD-10-CM | POA: Diagnosis not present

## 2015-02-04 DIAGNOSIS — E0520 Thyrotoxicosis with toxic multinodular goiter without thyrotoxic crisis or storm: Secondary | ICD-10-CM | POA: Diagnosis not present

## 2015-02-04 HISTORY — DX: Guillain-Barre syndrome: G61.0

## 2015-02-04 HISTORY — PX: THYROID LOBECTOMY: SHX420

## 2015-02-04 LAB — GLUCOSE, CAPILLARY: GLUCOSE-CAPILLARY: 103 mg/dL — AB (ref 65–99)

## 2015-02-04 SURGERY — LOBECTOMY, THYROID
Anesthesia: General | Site: Neck

## 2015-02-04 MED ORDER — PROPOFOL 10 MG/ML IV BOLUS
INTRAVENOUS | Status: AC
  Filled 2015-02-04: qty 20

## 2015-02-04 MED ORDER — CIPROFLOXACIN IN D5W 400 MG/200ML IV SOLN
INTRAVENOUS | Status: AC
  Filled 2015-02-04: qty 200

## 2015-02-04 MED ORDER — ACETAMINOPHEN 325 MG PO TABS: 325.0000 mg | ORAL_TABLET | ORAL | Status: DC | PRN

## 2015-02-04 MED ORDER — LACTATED RINGERS IV SOLN
INTRAVENOUS | Status: DC
  Administered 2015-02-04: 10:00:00 via INTRAVENOUS

## 2015-02-04 MED ORDER — GLYCOPYRROLATE 0.2 MG/ML IJ SOLN
INTRAMUSCULAR | Status: DC | PRN
  Administered 2015-02-04: .6 mg via INTRAVENOUS

## 2015-02-04 MED ORDER — ACETAMINOPHEN 650 MG RE SUPP: 650.0000 mg | Freq: Four times a day (QID) | RECTAL | Status: DC | PRN

## 2015-02-04 MED ORDER — ONDANSETRON 4 MG PO TBDP: 4.0000 mg | ORAL_TABLET | Freq: Four times a day (QID) | ORAL | Status: DC | PRN

## 2015-02-04 MED ORDER — ACETAMINOPHEN 325 MG PO TABS: 650.0000 mg | ORAL_TABLET | Freq: Four times a day (QID) | ORAL | Status: DC | PRN

## 2015-02-04 MED ORDER — ONDANSETRON HCL 4 MG/2ML IJ SOLN
INTRAMUSCULAR | Status: AC
  Filled 2015-02-04: qty 2

## 2015-02-04 MED ORDER — PROMETHAZINE HCL 25 MG/ML IJ SOLN
12.5000 mg | INTRAMUSCULAR | Status: DC | PRN
  Administered 2015-02-04: 12.5 mg via INTRAVENOUS
  Filled 2015-02-04: qty 1

## 2015-02-04 MED ORDER — LIDOCAINE HCL (CARDIAC) 20 MG/ML IV SOLN
INTRAVENOUS | Status: AC
  Filled 2015-02-04: qty 5

## 2015-02-04 MED ORDER — OXYCODONE HCL 5 MG/5ML PO SOLN: 5.0000 mg | Freq: Once | ORAL | Status: DC | PRN

## 2015-02-04 MED ORDER — LABETALOL HCL 5 MG/ML IV SOLN
INTRAVENOUS | Status: DC | PRN
  Administered 2015-02-04: 5 mg via INTRAVENOUS

## 2015-02-04 MED ORDER — 0.9 % SODIUM CHLORIDE (POUR BTL) OPTIME
TOPICAL | Status: DC | PRN
  Administered 2015-02-04: 1000 mL

## 2015-02-04 MED ORDER — MIDAZOLAM HCL 5 MG/5ML IJ SOLN
INTRAMUSCULAR | Status: DC | PRN
  Administered 2015-02-04: 2 mg via INTRAVENOUS

## 2015-02-04 MED ORDER — ONDANSETRON HCL 4 MG/2ML IJ SOLN
INTRAMUSCULAR | Status: DC | PRN
  Administered 2015-02-04: 4 mg via INTRAVENOUS

## 2015-02-04 MED ORDER — HYDROCODONE-ACETAMINOPHEN 5-325 MG PO TABS
1.0000 | ORAL_TABLET | ORAL | Status: DC | PRN
  Administered 2015-02-04 (×2): 2 via ORAL
  Administered 2015-02-05 (×3): 1 via ORAL
  Filled 2015-02-04: qty 1
  Filled 2015-02-04 (×2): qty 2
  Filled 2015-02-04 (×2): qty 1

## 2015-02-04 MED ORDER — PHENYLEPHRINE 40 MCG/ML (10ML) SYRINGE FOR IV PUSH (FOR BLOOD PRESSURE SUPPORT)
PREFILLED_SYRINGE | INTRAVENOUS | Status: AC
  Filled 2015-02-04: qty 10

## 2015-02-04 MED ORDER — KCL IN DEXTROSE-NACL 20-5-0.45 MEQ/L-%-% IV SOLN
INTRAVENOUS | Status: DC
  Administered 2015-02-04: 15:00:00 via INTRAVENOUS
  Filled 2015-02-04 (×2): qty 1000

## 2015-02-04 MED ORDER — NEOSTIGMINE METHYLSULFATE 10 MG/10ML IV SOLN
INTRAVENOUS | Status: DC | PRN
  Administered 2015-02-04: 3.5 mg via INTRAVENOUS

## 2015-02-04 MED ORDER — MIDAZOLAM HCL 2 MG/2ML IJ SOLN
INTRAMUSCULAR | Status: AC
  Filled 2015-02-04: qty 4

## 2015-02-04 MED ORDER — AMLODIPINE BESYLATE 5 MG PO TABS
5.0000 mg | ORAL_TABLET | Freq: Every day | ORAL | Status: DC
  Filled 2015-02-04: qty 1

## 2015-02-04 MED ORDER — FENTANYL CITRATE (PF) 250 MCG/5ML IJ SOLN
INTRAMUSCULAR | Status: AC
  Filled 2015-02-04: qty 25

## 2015-02-04 MED ORDER — HYDROMORPHONE HCL 2 MG/ML IJ SOLN
INTRAMUSCULAR | Status: AC
  Filled 2015-02-04: qty 1

## 2015-02-04 MED ORDER — LACTATED RINGERS IV SOLN
INTRAVENOUS | Status: DC | PRN
  Administered 2015-02-04: 07:00:00 via INTRAVENOUS

## 2015-02-04 MED ORDER — HYDROMORPHONE HCL 1 MG/ML IJ SOLN
0.2500 mg | INTRAMUSCULAR | Status: DC | PRN
  Administered 2015-02-04 (×2): 0.5 mg via INTRAVENOUS

## 2015-02-04 MED ORDER — CIPROFLOXACIN IN D5W 400 MG/200ML IV SOLN
400.0000 mg | INTRAVENOUS | Status: AC
  Administered 2015-02-04: 400 mg via INTRAVENOUS

## 2015-02-04 MED ORDER — HYDROMORPHONE HCL 1 MG/ML IJ SOLN
INTRAMUSCULAR | Status: AC
  Filled 2015-02-04: qty 1

## 2015-02-04 MED ORDER — HYDROMORPHONE HCL 1 MG/ML IJ SOLN: 1.0000 mg | INTRAMUSCULAR | Status: DC | PRN

## 2015-02-04 MED ORDER — PROPOFOL 10 MG/ML IV BOLUS
INTRAVENOUS | Status: DC | PRN
  Administered 2015-02-04: 120 mg via INTRAVENOUS

## 2015-02-04 MED ORDER — HYDROMORPHONE HCL 1 MG/ML IJ SOLN
INTRAMUSCULAR | Status: DC | PRN
  Administered 2015-02-04 (×2): 0.5 mg via INTRAVENOUS

## 2015-02-04 MED ORDER — ACETAMINOPHEN 160 MG/5ML PO SOLN
325.0000 mg | ORAL | Status: DC | PRN
  Filled 2015-02-04: qty 20.3

## 2015-02-04 MED ORDER — OXYCODONE HCL 5 MG PO TABS: 5.0000 mg | ORAL_TABLET | Freq: Once | ORAL | Status: DC | PRN

## 2015-02-04 MED ORDER — ONDANSETRON HCL 4 MG/2ML IJ SOLN
4.0000 mg | Freq: Four times a day (QID) | INTRAMUSCULAR | Status: DC | PRN
  Administered 2015-02-04: 4 mg via INTRAVENOUS

## 2015-02-04 MED ORDER — ROCURONIUM BROMIDE 100 MG/10ML IV SOLN
INTRAVENOUS | Status: DC | PRN
  Administered 2015-02-04: 40 mg via INTRAVENOUS
  Administered 2015-02-04: 10 mg via INTRAVENOUS

## 2015-02-04 MED ORDER — PHENYLEPHRINE HCL 10 MG/ML IJ SOLN
INTRAMUSCULAR | Status: DC | PRN
  Administered 2015-02-04: 80 ug via INTRAVENOUS
  Administered 2015-02-04 (×2): 40 ug via INTRAVENOUS

## 2015-02-04 MED ORDER — LIDOCAINE HCL (CARDIAC) 20 MG/ML IV SOLN
INTRAVENOUS | Status: DC | PRN
  Administered 2015-02-04: 50 mg via INTRAVENOUS

## 2015-02-04 MED ORDER — FENTANYL CITRATE (PF) 100 MCG/2ML IJ SOLN
INTRAMUSCULAR | Status: DC | PRN
  Administered 2015-02-04: 50 ug via INTRAVENOUS
  Administered 2015-02-04: 100 ug via INTRAVENOUS
  Administered 2015-02-04 (×2): 50 ug via INTRAVENOUS

## 2015-02-04 MED ORDER — CALCIUM CARBONATE 1250 (500 CA) MG PO TABS
2.0000 | ORAL_TABLET | Freq: Three times a day (TID) | ORAL | Status: DC
  Administered 2015-02-04 – 2015-02-05 (×2): 1000 mg via ORAL
  Filled 2015-02-04 (×6): qty 2

## 2015-02-04 SURGICAL SUPPLY — 39 items
APL SKNCLS STERI-STRIP NONHPOA (GAUZE/BANDAGES/DRESSINGS) ×1
ATTRACTOMAT 16X20 MAGNETIC DRP (DRAPES) ×3 IMPLANT
BENZOIN TINCTURE PRP APPL 2/3 (GAUZE/BANDAGES/DRESSINGS) ×2 IMPLANT
BLADE HEX COATED 2.75 (ELECTRODE) ×3 IMPLANT
BLADE SURG 15 STRL LF DISP TIS (BLADE) ×1 IMPLANT
BLADE SURG 15 STRL SS (BLADE) ×3
CHLORAPREP W/TINT 26ML (MISCELLANEOUS) ×3 IMPLANT
CLIP TI MEDIUM 6 (CLIP) ×10 IMPLANT
CLIP TI WIDE RED SMALL 6 (CLIP) ×10 IMPLANT
CLOSURE STERI-STRIP 1/4X4 (GAUZE/BANDAGES/DRESSINGS) ×2 IMPLANT
CLOSURE WOUND 1/2 X4 (GAUZE/BANDAGES/DRESSINGS)
COVER SURGICAL LIGHT HANDLE (MISCELLANEOUS) ×1 IMPLANT
DISSECTOR ROUND CHERRY 3/8 STR (MISCELLANEOUS) IMPLANT
DRAPE LAPAROTOMY T 98X78 PEDS (DRAPES) ×3 IMPLANT
DRESSING SURGICEL FIBRLLR 1X2 (HEMOSTASIS) ×1 IMPLANT
DRSG SURGICEL FIBRILLAR 1X2 (HEMOSTASIS) ×3
ELECT PENCIL ROCKER SW 15FT (MISCELLANEOUS) ×3 IMPLANT
ELECT REM PT RETURN 9FT ADLT (ELECTROSURGICAL) ×3
ELECTRODE REM PT RTRN 9FT ADLT (ELECTROSURGICAL) ×1 IMPLANT
GAUZE SPONGE 4X4 12PLY STRL (GAUZE/BANDAGES/DRESSINGS) ×2 IMPLANT
GAUZE SPONGE 4X4 16PLY XRAY LF (GAUZE/BANDAGES/DRESSINGS) ×3 IMPLANT
GLOVE SURG ORTHO 8.0 STRL STRW (GLOVE) ×3 IMPLANT
GOWN STRL REUS W/TWL XL LVL3 (GOWN DISPOSABLE) ×6 IMPLANT
KIT BASIN OR (CUSTOM PROCEDURE TRAY) ×3 IMPLANT
LIQUID BAND (GAUZE/BANDAGES/DRESSINGS) IMPLANT
PACK BASIC VI WITH GOWN DISP (CUSTOM PROCEDURE TRAY) ×3 IMPLANT
SHEARS HARMONIC 9CM CVD (BLADE) ×3 IMPLANT
STAPLER VISISTAT 35W (STAPLE) ×3 IMPLANT
STRIP CLOSURE SKIN 1/2X4 (GAUZE/BANDAGES/DRESSINGS) IMPLANT
SUT MNCRL AB 4-0 PS2 18 (SUTURE) ×3 IMPLANT
SUT SILK 2 0 (SUTURE) ×3
SUT SILK 2-0 18XBRD TIE 12 (SUTURE) ×1 IMPLANT
SUT SILK 3 0 (SUTURE) ×3
SUT SILK 3-0 18XBRD TIE 12 (SUTURE) IMPLANT
SUT VIC AB 3-0 SH 18 (SUTURE) ×3 IMPLANT
SYR BULB IRRIGATION 50ML (SYRINGE) ×3 IMPLANT
TAPE CLOTH SURG 4X10 WHT LF (GAUZE/BANDAGES/DRESSINGS) ×2 IMPLANT
TOWEL OR 17X26 10 PK STRL BLUE (TOWEL DISPOSABLE) ×3 IMPLANT
YANKAUER SUCT BULB TIP 10FT TU (MISCELLANEOUS) ×3 IMPLANT

## 2015-02-04 NOTE — Transfer of Care (Signed)
Immediate Anesthesia Transfer of Care Note  Patient: Margaret Hodges  Procedure(s) Performed: Procedure(s): TOTAL THYROIDECTOMY (N/A)  Patient Location: PACU  Anesthesia Type:General  Level of Consciousness:  sedated, patient cooperative and responds to stimulation  Airway & Oxygen Therapy:Patient Spontanous Breathing and Patient connected to face mask oxgen  Post-op Assessment:  Report given to PACU RN and Post -op Vital signs reviewed and stable  Post vital signs:  Reviewed and stable  Last Vitals:  Filed Vitals:   02/04/15 0532  BP: 139/72  Pulse: 88  Temp: 36.4 C  Resp: 16    Complications: No apparent anesthesia complications

## 2015-02-04 NOTE — Op Note (Signed)
Procedure Note  Pre-operative Diagnosis:  Multiple thyroid nodules, subclinical hyperthyroidism  Post-operative Diagnosis:  same  Surgeon:  Earnstine Regal, MD, FACS  Assistant:  none   Procedure:  Total thyroidectomy  Anesthesia:  General  Estimated Blood Loss:  minimal  Drains: none         Specimen: thyroid to pathology  Indications:  The patient is a 69 year old female who presents with a thyroid nodule. Patient referred by Dr. [name] for evaluation of multinodular thyroid with borderline hyperthyroidism. Patient has had a long-standing thyroid goiter with multiple nodules. She has had testing dating back approximately 20 years. This included her last biopsy of the dominant left-sided thyroid nodule in 2004 which was reportedly benign. Recent TSH levels are borderline low at 0.53. Patient has had recent episodes with weight loss and diarrhea which she attributes to her thyroid. She has had a recent thyroid ultrasound in February 2016 showing the right lobe measuring 5.8 cm and containing multiple subcentimeter nodules. Left lobe was enlarged at 6.4 cm and contained a dominant nodule measuring 5.6 x 3.0 x 2.8 cm and containing multiple calcifications.   Procedure Details: Procedure was done in OR #3 at the [facility].  The patient was brought to the operating room and placed in a supine position on the operating room table.  Following administration of general anesthesia, the patient was positioned and then prepped and draped in the usual aseptic fashion.  After ascertaining that an adequate level of anesthesia had been achieved, a Kocher incision was made with #15 blade.  Dissection was carried through subcutaneous tissues and platysma. Hemostasis was achieved with the electrocautery.  Skin flaps were elevated cephalad and caudad from the thyroid notch to the sternal notch.  The [place] self-retaining retractor was placed for exposure.  Strap muscles were incised  in the midline and dissection was begun on the left side.  Strap muscles were reflected laterally.  Left thyroid lobe was moderately enlarged with multiple firm nodules.  The left lobe was gently mobilized with blunt dissection.  Superior pole vessels were dissected out and divided individually between small and medium Ligaclips with the Harmonic scalpel.  The thyroid lobe was rolled anteriorly.  Branches of the inferior thyroid artery were divided between small Ligaclips with the Harmonic scalpel.  Inferior venous tributaries were divided between Ligaclips.  Both the superior and inferior parathyroid glands were identified and preserved on their vascular pedicles.  The recurrent laryngeal nerve was identified and preserved along its course.  The ligament of Gwenlyn Found was released with the electrocautery and the gland was mobilized onto the anterior trachea. Isthmus was mobilized across the midline.  There was no pyramidal lobe present.  Dry pack was placed in the left neck.  Next, the right thyroid lobe was gently mobilized with blunt dissection.  Right thyroid lobe was normal in size with small nodules.  Superior pole vessels were dissected out and divided between small and medium Ligaclips with the Harmonic scalpel.  Superior parathyroid was identified and preserved.  Inferior venous tributaries were divided between medium Ligaclips with the Harmonic scalpel.  The right thyroid lobe was rolled anteriorly and the branches of the inferior thyroid artery divided between small Ligaclips.  The right recurrent laryngeal nerve was identified and preserved along its course.  The ligament of Gwenlyn Found was released with the electrocautery.  The right thyroid lobe was mobilized onto the anterior trachea and the remainder of the thyroid was dissected off the anterior trachea  and the thyroid was completely excised. The entire thyroid gland was submitted in two parts to pathology for review.  The neck was irrigated with warm  saline.  Fibrillar was placed throughout the operative field.  Strap muscles were reapproximated in the midline with interrupted 3-0 Vicryl sutures.  Platysma was closed with interrupted 3-0 Vicryl sutures.  Skin was closed with a running 4-0 Monocryl subcuticular suture.  Wound was washed and dried and benzoin and steri-strips were applied.  Dry gauze dressing was placed.  The patient was awakened from anesthesia and brought to the recovery room.  The patient tolerated the procedure well.   Earnstine Regal, MD, [facility] Office: [phone]

## 2015-02-04 NOTE — Anesthesia Preprocedure Evaluation (Signed)
Anesthesia Evaluation  Patient identified by MRN, date of birth, ID band Patient awake    Reviewed: Allergy & Precautions, NPO status , Patient's Chart, lab work & pertinent test results  History of Anesthesia Complications (+) PONV and history of anesthetic complications  Airway Mallampati: II  TM Distance: >3 FB Neck ROM: Full    Dental  (+) Teeth Intact   Pulmonary neg shortness of breath, neg sleep apnea, neg COPD, neg recent URI, Current Smoker, neg PE   breath sounds clear to auscultation       Cardiovascular hypertension, Pt. on medications (-) angina(-) CAD, (-) Past MI and (-) CHF  Rhythm:Regular     Neuro/Psych  Headaches, neg Seizures  Neuromuscular disease negative psych ROS   GI/Hepatic Neg liver ROS, hiatal hernia, GERD  Medicated and Controlled,  Endo/Other  Hyperthyroidism   Renal/GU negative Renal ROS     Musculoskeletal negative musculoskeletal ROS (+)   Abdominal   Peds  Hematology negative hematology ROS (+)   Anesthesia Other Findings   Reproductive/Obstetrics                             Anesthesia Physical Anesthesia Plan  ASA: II  Anesthesia Plan: General   Post-op Pain Management:    Induction: Intravenous  Airway Management Planned: Oral ETT  Additional Equipment: None  Intra-op Plan:   Post-operative Plan: Extubation in OR  Informed Consent: I have reviewed the patients History and Physical, chart, labs and discussed the procedure including the risks, benefits and alternatives for the proposed anesthesia with the patient or authorized representative who has indicated his/her understanding and acceptance.   Dental advisory given  Plan Discussed with: CRNA and Surgeon  Anesthesia Plan Comments:         Anesthesia Quick Evaluation

## 2015-02-04 NOTE — Anesthesia Procedure Notes (Signed)
Procedure Name: Intubation Date/Time: 02/04/2015 7:29 AM Performed by: Anne Fu Pre-anesthesia Checklist: Patient identified, Emergency Drugs available, Suction available, Patient being monitored and Timeout performed Patient Re-evaluated:Patient Re-evaluated prior to inductionOxygen Delivery Method: Circle system utilized Preoxygenation: Pre-oxygenation with 100% oxygen Intubation Type: IV induction Ventilation: Mask ventilation without difficulty Laryngoscope Size: Mac and 4 Tube type: Oral Tube size: 7.5 mm Number of attempts: 1 Airway Equipment and Method: Stylet Placement Confirmation: ETT inserted through vocal cords under direct vision,  positive ETCO2,  CO2 detector and breath sounds checked- equal and bilateral Secured at: 21 cm Tube secured with: Tape Dental Injury: Teeth and Oropharynx as per pre-operative assessment

## 2015-02-04 NOTE — Anesthesia Postprocedure Evaluation (Signed)
  Anesthesia Post-op Note  Patient: Margaret Hodges  Procedure(s) Performed: Procedure(s): TOTAL THYROIDECTOMY (N/A)  Patient Location: PACU  Anesthesia Type:General  Level of Consciousness: awake  Airway and Oxygen Therapy: Patient Spontanous Breathing  Post-op Pain: mild  Post-op Assessment: Post-op Vital signs reviewed, Patient's Cardiovascular Status Stable, Respiratory Function Stable, Patent Airway, No signs of Nausea or vomiting and Pain level controlled              Post-op Vital Signs: Reviewed and stable  Last Vitals:  Filed Vitals:   02/04/15 1030  BP: 174/86  Pulse: 91  Temp: 36.6 C  Resp: 12    Complications: No apparent anesthesia complications

## 2015-02-04 NOTE — Interval H&P Note (Signed)
History and Physical Interval Note:  02/04/2015 7:09 AM  Margaret Hodges  has presented today for surgery, with the diagnosis of Goiter and Hyperthyroidism.  The various methods of treatment have been discussed with the patient and family. After consideration of risks, benefits and other options for treatment, the patient has consented to   Procedure: Total thyroidectomy   The patient's history has been reviewed, patient examined, no change in status, stable for surgery.  I have reviewed the patient's chart and labs.  Questions were answered to the patient's satisfaction.    Earnstine Regal, MD, [facility] Office: [place]

## 2015-02-05 DIAGNOSIS — K219 Gastro-esophageal reflux disease without esophagitis: Secondary | ICD-10-CM | POA: Diagnosis not present

## 2015-02-05 DIAGNOSIS — M1990 Unspecified osteoarthritis, unspecified site: Secondary | ICD-10-CM | POA: Diagnosis not present

## 2015-02-05 DIAGNOSIS — E042 Nontoxic multinodular goiter: Secondary | ICD-10-CM | POA: Diagnosis not present

## 2015-02-05 DIAGNOSIS — E7800 Pure hypercholesterolemia, unspecified: Secondary | ICD-10-CM | POA: Diagnosis not present

## 2015-02-05 DIAGNOSIS — Z79890 Hormone replacement therapy (postmenopausal): Secondary | ICD-10-CM | POA: Diagnosis not present

## 2015-02-05 DIAGNOSIS — E0580 Other thyrotoxicosis without thyrotoxic crisis or storm: Secondary | ICD-10-CM | POA: Diagnosis not present

## 2015-02-05 LAB — BASIC METABOLIC PANEL
Anion gap: 6 (ref 5–15)
BUN: 11 mg/dL (ref 6–20)
CALCIUM: 10.2 mg/dL (ref 8.9–10.3)
CHLORIDE: 101 mmol/L (ref 101–111)
CO2: 29 mmol/L (ref 22–32)
CREATININE: 0.82 mg/dL (ref 0.44–1.00)
Glucose, Bld: 121 mg/dL — ABNORMAL HIGH (ref 65–99)
Potassium: 4.4 mmol/L (ref 3.5–5.1)
Sodium: 136 mmol/L (ref 135–145)

## 2015-02-05 MED ORDER — LEVOTHYROXINE SODIUM 88 MCG PO TABS: 88.0000 ug | ORAL_TABLET | Freq: Every day | ORAL | Status: DC

## 2015-02-05 MED ORDER — CALCIUM CARBONATE-VITAMIN D 500-200 MG-UNIT PO TABS: 1.0000 | ORAL_TABLET | Freq: Two times a day (BID) | ORAL | Status: DC

## 2015-02-05 NOTE — Progress Notes (Signed)
Patient tolerating regular diet, walking in halls, pain controlled with oral pain meds, surgical incision unremarkable and dry gauze dressing placed over incision.  Discharge instructions and prescriptions given to patient.  Patient to discharge to home when transportation arrives.

## 2015-02-05 NOTE — Discharge Summary (Signed)
  Physician Discharge Summary [facility]  Patient ID: Margaret Hodges MRN: [id number] DOB/AGE: [birth year] 69 y.o.  Admit date: 02/04/2015 Discharge date: 02/05/2015  Admission Diagnoses:  Multiple thyroid nodules  Discharge Diagnoses:  Principal Problem:   Multiple thyroid nodules Active Problems:   Hyperthyroidism, subclinical   Discharged Condition: good  Hospital Course: Patient was admitted for observation following thyroid surgery.  Post op course was uncomplicated.  Pain was well controlled.  Tolerated diet.  Post op calcium level on morning following surgery was 10.2 mg/dl.  Patient was prepared for discharge home on POD#1.  Consults: None  Treatments: surgery: total thyroidectomy  Discharge Exam: Blood pressure 128/77, pulse 4, temperature 98.2 F (36.8 C), temperature source Oral, resp. rate 18, height 5\' 8"  (1.727 m), weight 74.844 kg (165 lb), SpO2 99 %. HEENT - clear Neck - wound dry and intact; mild hoarseness; minimal STS Chest - clear bilaterally Cor - RRR  Disposition: Home     Medication List    ASK your doctor about these medications        amLODipine 5 MG tablet  Commonly known as:  NORVASC  Take 1 tablet (5 mg total) by mouth daily.     diclofenac sodium 1 % Gel  Commonly known as:  VOLTAREN  Apply 4 g topically 4 (four) times daily as needed (arthritis pain).     doxycycline 100 MG EC tablet  Commonly known as:  DORYX  Take 100 mg by mouth 2 (two) times daily. Completed course 10/28     estradiol 0.1 MG/GM vaginal cream  Commonly known as:  ESTRACE  Place 1 Applicatorful vaginally once a week. Fridays     HYDROcodone-acetaminophen 5-325 MG tablet  Commonly known as:  NORCO/VICODIN  Take 1 tablet by mouth every 6 (six) hours as needed for moderate pain.     hyoscyamine 0.375 MG 12 hr tablet  Commonly known as:  LEVBID  Take 0.375 mg by mouth every 12 (twelve) hours as needed (blaader spasms).     omeprazole 20 MG  capsule  Commonly known as:  PRILOSEC  Take 20 mg by mouth daily.     PHENYLEPHRINE COMPLEX PO  Take 5 mg by mouth as directed. Takes every morning and may takes up to 2 additional tablets if needed for sinus congestion during the day     ranitidine 150 MG tablet  Commonly known as:  ZANTAC  Take 150 mg by mouth at bedtime as needed for heartburn.     saccharomyces boulardii 250 MG capsule  Commonly known as:  FLORASTOR  Take 250 mg by mouth daily as needed (only when on antibiotic).     Vitamin D 2000 UNITS tablet  Take 2,000 Units by mouth daily.         Earnstine Regal, MD, [facility] Office: [phone]   Signed: Earnstine Regal 02/05/2015, 11:27 AM

## 2015-02-08 NOTE — Progress Notes (Signed)
Quick Note:  Please contact patient and notify of benign pathology results.  Lashawnna Lambrecht M. Rashawn Rayman, MD, FACS [facility] Office: [phone]   ______ 

## 2015-02-25 DIAGNOSIS — E0590 Thyrotoxicosis, unspecified without thyrotoxic crisis or storm: Secondary | ICD-10-CM | POA: Diagnosis not present

## 2015-03-10 DIAGNOSIS — E0590 Thyrotoxicosis, unspecified without thyrotoxic crisis or storm: Secondary | ICD-10-CM | POA: Diagnosis not present

## 2015-04-12 DIAGNOSIS — Z1231 Encounter for screening mammogram for malignant neoplasm of breast: Secondary | ICD-10-CM | POA: Diagnosis not present

## 2015-06-03 DIAGNOSIS — K219 Gastro-esophageal reflux disease without esophagitis: Secondary | ICD-10-CM | POA: Diagnosis not present

## 2015-06-03 DIAGNOSIS — J309 Allergic rhinitis, unspecified: Secondary | ICD-10-CM | POA: Diagnosis not present

## 2015-06-03 DIAGNOSIS — E559 Vitamin D deficiency, unspecified: Secondary | ICD-10-CM | POA: Diagnosis not present

## 2015-06-03 DIAGNOSIS — E039 Hypothyroidism, unspecified: Secondary | ICD-10-CM | POA: Diagnosis not present

## 2015-08-04 DIAGNOSIS — E559 Vitamin D deficiency, unspecified: Secondary | ICD-10-CM | POA: Diagnosis not present

## 2015-08-04 DIAGNOSIS — E039 Hypothyroidism, unspecified: Secondary | ICD-10-CM | POA: Diagnosis not present

## 2015-09-14 DIAGNOSIS — J441 Chronic obstructive pulmonary disease with (acute) exacerbation: Secondary | ICD-10-CM | POA: Diagnosis not present

## 2015-09-14 DIAGNOSIS — J189 Pneumonia, unspecified organism: Secondary | ICD-10-CM | POA: Diagnosis not present

## 2015-09-21 DIAGNOSIS — Z79899 Other long term (current) drug therapy: Secondary | ICD-10-CM | POA: Diagnosis not present

## 2015-09-21 DIAGNOSIS — M1990 Unspecified osteoarthritis, unspecified site: Secondary | ICD-10-CM | POA: Diagnosis not present

## 2015-09-21 DIAGNOSIS — E559 Vitamin D deficiency, unspecified: Secondary | ICD-10-CM | POA: Diagnosis not present

## 2015-09-21 DIAGNOSIS — K640 First degree hemorrhoids: Secondary | ICD-10-CM | POA: Diagnosis not present

## 2015-09-21 DIAGNOSIS — E039 Hypothyroidism, unspecified: Secondary | ICD-10-CM | POA: Diagnosis not present

## 2015-09-21 DIAGNOSIS — E785 Hyperlipidemia, unspecified: Secondary | ICD-10-CM | POA: Diagnosis not present

## 2015-09-21 DIAGNOSIS — M899 Disorder of bone, unspecified: Secondary | ICD-10-CM | POA: Diagnosis not present

## 2015-09-21 DIAGNOSIS — Z0000 Encounter for general adult medical examination without abnormal findings: Secondary | ICD-10-CM | POA: Diagnosis not present

## 2015-09-21 DIAGNOSIS — G610 Guillain-Barre syndrome: Secondary | ICD-10-CM | POA: Diagnosis not present

## 2015-09-21 DIAGNOSIS — I700 Atherosclerosis of aorta: Secondary | ICD-10-CM | POA: Diagnosis not present

## 2015-10-12 DIAGNOSIS — H43392 Other vitreous opacities, left eye: Secondary | ICD-10-CM | POA: Diagnosis not present

## 2015-10-12 DIAGNOSIS — H35033 Hypertensive retinopathy, bilateral: Secondary | ICD-10-CM | POA: Diagnosis not present

## 2015-10-22 DIAGNOSIS — Z1211 Encounter for screening for malignant neoplasm of colon: Secondary | ICD-10-CM | POA: Diagnosis not present

## 2015-11-03 DIAGNOSIS — E2839 Other primary ovarian failure: Secondary | ICD-10-CM | POA: Diagnosis not present

## 2015-11-03 DIAGNOSIS — M8588 Other specified disorders of bone density and structure, other site: Secondary | ICD-10-CM | POA: Diagnosis not present

## 2015-11-05 DIAGNOSIS — H2512 Age-related nuclear cataract, left eye: Secondary | ICD-10-CM | POA: Diagnosis not present

## 2015-11-05 DIAGNOSIS — H25012 Cortical age-related cataract, left eye: Secondary | ICD-10-CM | POA: Diagnosis not present

## 2015-11-05 DIAGNOSIS — H25011 Cortical age-related cataract, right eye: Secondary | ICD-10-CM | POA: Diagnosis not present

## 2015-11-05 DIAGNOSIS — H2511 Age-related nuclear cataract, right eye: Secondary | ICD-10-CM | POA: Diagnosis not present

## 2015-11-15 DIAGNOSIS — H52202 Unspecified astigmatism, left eye: Secondary | ICD-10-CM | POA: Diagnosis not present

## 2015-11-15 DIAGNOSIS — H2513 Age-related nuclear cataract, bilateral: Secondary | ICD-10-CM | POA: Diagnosis not present

## 2015-11-15 DIAGNOSIS — H2512 Age-related nuclear cataract, left eye: Secondary | ICD-10-CM | POA: Diagnosis not present

## 2015-11-16 DIAGNOSIS — H25011 Cortical age-related cataract, right eye: Secondary | ICD-10-CM | POA: Diagnosis not present

## 2015-11-16 DIAGNOSIS — H2511 Age-related nuclear cataract, right eye: Secondary | ICD-10-CM | POA: Diagnosis not present

## 2015-11-23 DIAGNOSIS — H2513 Age-related nuclear cataract, bilateral: Secondary | ICD-10-CM | POA: Diagnosis not present

## 2015-12-10 DIAGNOSIS — H2511 Age-related nuclear cataract, right eye: Secondary | ICD-10-CM | POA: Diagnosis not present

## 2015-12-10 DIAGNOSIS — H2513 Age-related nuclear cataract, bilateral: Secondary | ICD-10-CM | POA: Diagnosis not present

## 2016-01-31 DIAGNOSIS — H20013 Primary iridocyclitis, bilateral: Secondary | ICD-10-CM | POA: Diagnosis not present

## 2016-02-01 DIAGNOSIS — G610 Guillain-Barre syndrome: Secondary | ICD-10-CM | POA: Diagnosis not present

## 2016-02-01 DIAGNOSIS — H2013 Chronic iridocyclitis, bilateral: Secondary | ICD-10-CM | POA: Diagnosis not present

## 2016-02-04 DIAGNOSIS — E039 Hypothyroidism, unspecified: Secondary | ICD-10-CM | POA: Diagnosis not present

## 2016-05-16 DIAGNOSIS — Z1231 Encounter for screening mammogram for malignant neoplasm of breast: Secondary | ICD-10-CM | POA: Diagnosis not present

## 2016-06-08 DIAGNOSIS — M160 Bilateral primary osteoarthritis of hip: Secondary | ICD-10-CM | POA: Diagnosis not present

## 2016-06-08 DIAGNOSIS — R109 Unspecified abdominal pain: Secondary | ICD-10-CM | POA: Diagnosis not present

## 2016-06-08 DIAGNOSIS — G894 Chronic pain syndrome: Secondary | ICD-10-CM | POA: Diagnosis not present

## 2016-06-08 DIAGNOSIS — N3000 Acute cystitis without hematuria: Secondary | ICD-10-CM | POA: Diagnosis not present

## 2016-06-08 DIAGNOSIS — R1030 Lower abdominal pain, unspecified: Secondary | ICD-10-CM | POA: Diagnosis not present

## 2016-06-08 DIAGNOSIS — M479 Spondylosis, unspecified: Secondary | ICD-10-CM | POA: Diagnosis not present

## 2016-06-08 DIAGNOSIS — M1990 Unspecified osteoarthritis, unspecified site: Secondary | ICD-10-CM | POA: Diagnosis not present

## 2016-07-06 DIAGNOSIS — J069 Acute upper respiratory infection, unspecified: Secondary | ICD-10-CM | POA: Diagnosis not present

## 2016-07-06 DIAGNOSIS — N3010 Interstitial cystitis (chronic) without hematuria: Secondary | ICD-10-CM | POA: Diagnosis not present

## 2016-07-06 DIAGNOSIS — K589 Irritable bowel syndrome without diarrhea: Secondary | ICD-10-CM | POA: Diagnosis not present

## 2016-08-17 DIAGNOSIS — Z1211 Encounter for screening for malignant neoplasm of colon: Secondary | ICD-10-CM | POA: Diagnosis not present

## 2016-08-17 DIAGNOSIS — K5730 Diverticulosis of large intestine without perforation or abscess without bleeding: Secondary | ICD-10-CM | POA: Diagnosis not present

## 2016-10-17 DIAGNOSIS — E559 Vitamin D deficiency, unspecified: Secondary | ICD-10-CM | POA: Diagnosis not present

## 2016-10-17 DIAGNOSIS — F17200 Nicotine dependence, unspecified, uncomplicated: Secondary | ICD-10-CM | POA: Diagnosis not present

## 2016-10-17 DIAGNOSIS — H209 Unspecified iridocyclitis: Secondary | ICD-10-CM | POA: Diagnosis not present

## 2016-10-17 DIAGNOSIS — I10 Essential (primary) hypertension: Secondary | ICD-10-CM | POA: Diagnosis not present

## 2016-10-17 DIAGNOSIS — L301 Dyshidrosis [pompholyx]: Secondary | ICD-10-CM | POA: Diagnosis not present

## 2016-10-17 DIAGNOSIS — Z136 Encounter for screening for cardiovascular disorders: Secondary | ICD-10-CM | POA: Diagnosis not present

## 2016-10-17 DIAGNOSIS — Z1159 Encounter for screening for other viral diseases: Secondary | ICD-10-CM | POA: Diagnosis not present

## 2016-10-17 DIAGNOSIS — Z6826 Body mass index (BMI) 26.0-26.9, adult: Secondary | ICD-10-CM | POA: Diagnosis not present

## 2016-10-17 DIAGNOSIS — Z0000 Encounter for general adult medical examination without abnormal findings: Secondary | ICD-10-CM | POA: Diagnosis not present

## 2016-10-17 DIAGNOSIS — E039 Hypothyroidism, unspecified: Secondary | ICD-10-CM | POA: Diagnosis not present

## 2016-10-17 DIAGNOSIS — Z79899 Other long term (current) drug therapy: Secondary | ICD-10-CM | POA: Diagnosis not present

## 2016-10-17 DIAGNOSIS — E785 Hyperlipidemia, unspecified: Secondary | ICD-10-CM | POA: Diagnosis not present

## 2016-12-12 DIAGNOSIS — I10 Essential (primary) hypertension: Secondary | ICD-10-CM | POA: Diagnosis not present

## 2016-12-12 DIAGNOSIS — Z961 Presence of intraocular lens: Secondary | ICD-10-CM | POA: Diagnosis not present

## 2016-12-12 DIAGNOSIS — H18413 Arcus senilis, bilateral: Secondary | ICD-10-CM | POA: Diagnosis not present

## 2016-12-22 DIAGNOSIS — H02423 Myogenic ptosis of bilateral eyelids: Secondary | ICD-10-CM | POA: Diagnosis not present

## 2016-12-22 DIAGNOSIS — R258 Other abnormal involuntary movements: Secondary | ICD-10-CM | POA: Diagnosis not present

## 2016-12-22 DIAGNOSIS — H53489 Generalized contraction of visual field, unspecified eye: Secondary | ICD-10-CM | POA: Diagnosis not present

## 2016-12-22 DIAGNOSIS — H0279 Other degenerative disorders of eyelid and periocular area: Secondary | ICD-10-CM | POA: Diagnosis not present

## 2016-12-22 DIAGNOSIS — H02834 Dermatochalasis of left upper eyelid: Secondary | ICD-10-CM | POA: Diagnosis not present

## 2016-12-22 DIAGNOSIS — H02413 Mechanical ptosis of bilateral eyelids: Secondary | ICD-10-CM | POA: Diagnosis not present

## 2016-12-22 DIAGNOSIS — H02831 Dermatochalasis of right upper eyelid: Secondary | ICD-10-CM | POA: Diagnosis not present

## 2017-02-19 DIAGNOSIS — H02413 Mechanical ptosis of bilateral eyelids: Secondary | ICD-10-CM | POA: Diagnosis not present

## 2017-02-19 DIAGNOSIS — H02831 Dermatochalasis of right upper eyelid: Secondary | ICD-10-CM | POA: Diagnosis not present

## 2017-02-19 DIAGNOSIS — H02834 Dermatochalasis of left upper eyelid: Secondary | ICD-10-CM | POA: Diagnosis not present

## 2017-02-19 DIAGNOSIS — H53489 Generalized contraction of visual field, unspecified eye: Secondary | ICD-10-CM | POA: Diagnosis not present

## 2017-02-19 DIAGNOSIS — H02423 Myogenic ptosis of bilateral eyelids: Secondary | ICD-10-CM | POA: Diagnosis not present

## 2017-02-19 DIAGNOSIS — H02432 Paralytic ptosis of left eyelid: Secondary | ICD-10-CM | POA: Diagnosis not present

## 2017-02-19 DIAGNOSIS — H0279 Other degenerative disorders of eyelid and periocular area: Secondary | ICD-10-CM | POA: Diagnosis not present

## 2017-02-19 DIAGNOSIS — R258 Other abnormal involuntary movements: Secondary | ICD-10-CM | POA: Diagnosis not present

## 2017-03-22 ENCOUNTER — Telehealth: Payer: Self-pay | Admitting: Hematology

## 2017-03-22 NOTE — Telephone Encounter (Signed)
Spoke with patient regarding appointment D/T/Loc/Phone#

## 2017-04-10 DIAGNOSIS — R21 Rash and other nonspecific skin eruption: Secondary | ICD-10-CM | POA: Diagnosis not present

## 2017-04-10 DIAGNOSIS — N952 Postmenopausal atrophic vaginitis: Secondary | ICD-10-CM | POA: Diagnosis not present

## 2017-04-10 DIAGNOSIS — F430 Acute stress reaction: Secondary | ICD-10-CM | POA: Diagnosis not present

## 2017-04-10 DIAGNOSIS — G894 Chronic pain syndrome: Secondary | ICD-10-CM | POA: Diagnosis not present

## 2017-04-16 ENCOUNTER — Telehealth: Payer: Self-pay

## 2017-04-16 NOTE — Telephone Encounter (Signed)
Called pt to determine if able to reschedule for 04/24/17 at 1pm to see Dr. Irene Limbo for initial pt visit as MD will be out of the office on 1/28. Pt husband, Berneta Sages, commented that the pt would be unable to come in the afternoons as she is taking care of children at that time. In-basket and scheduling message sent to Modena Morrow, new patient scheduler to help assist with appropriate date and time for pt to see Dr. Irene Limbo.

## 2017-04-19 ENCOUNTER — Telehealth: Payer: Self-pay | Admitting: Hematology

## 2017-04-19 NOTE — Telephone Encounter (Signed)
PAL - Moved 1/28 new patient appointment to 2/12 - date/time per patient. Per staff message from desk nurse to scheduler (tv) patient cannot come 1/29 - reschedule at patient's convenience.

## 2017-04-23 ENCOUNTER — Encounter: Payer: Medicare Other | Admitting: Hematology

## 2017-05-07 NOTE — Progress Notes (Signed)
HEMATOLOGY/ONCOLOGY CONSULTATION NOTE  Date of Service: 05/08/2017  Patient Care Team: [name], MD as PCP - General (Family Medicine)  CHIEF COMPLAINTS/PURPOSE OF CONSULTATION:  RBC Macrocytosis    HISTORY OF PRESENTING ILLNESS:   Margaret Hodges is a wonderful 72 y.o. female who has been referred to [place] by her PCP Dr. [name], from [place] [facility], for evaluation and management of RBC Macrocytosis.   Pt notes her red blood cells were larger according to Dr. Stephanie Acre. Pt notes her recent thyroid workup shows no signs of thyroid cancer. She has been on thyroid replacement for 2 years when she was diagnosed with hypothyroidism. She is on 16mcg of synthroid. She denies drinking any alcohol. She notes to having a history of uveitis in the eye.   She also notes to having a possible cyst on her distal medial thigh. She denies being bit by anything to her knowledge. This does not cause her pain or is present anywhere else in her body.  She denies a history of skin cancer.   She notes she works around the house to take care of her husband who has alzheimer's and COPD. She is very active.   On review of symptoms, pt notes she has some occasional hot flashes. She notes to having arthritic pain but it's manageable.  She denies fever, chills, or new bone pain.    MEDICAL HISTORY:  Past Medical History:  Diagnosis Date  . Anginal pain ([place])    with admission to hospital due to high blood pressure  . GERD (gastroesophageal reflux disease)   . Guillain Barr syndrome ([place]) 1959  . Guillain Barr syndrome ([place]) 1959  . Guillain Barr syndrome ([place]) 1959  . Headache    sinus headaches  . History of hiatal hernia    was told pt. had one, but no testing  . Hypertensive urgency 04/05/2014  . Neuromuscular disorder ([place])    Guillian- Barre Syndrome  . Pneumonia    hx. of walking pneumonia    SURGICAL HISTORY: Past Surgical History:  Procedure Laterality  Date  . ABDOMINAL HYSTERECTOMY    . Bladder lift  15 years ago,  Uterus prolapse causing intestinal damage and surgery    . Right torn meniscus repair x 2    . THYROID LOBECTOMY N/A 02/04/2015   Procedure: TOTAL THYROIDECTOMY;  Surgeon: Armandina Gemma, MD;  Location: [facility];  Service: General;  Laterality: N/A;  . TONSILLECTOMY    . TUMOR REMOVAL    . Tumors on arms removed,  Tumor on gum removed,  Tumor removed from inside ear      SOCIAL HISTORY: Social History   Socioeconomic History  . Marital status: Married    Spouse name: Not on file  . Number of children: Not on file  . Years of education: Not on file  . Highest education level: Not on file  Social Needs  . Financial resource strain: Not on file  . Food insecurity - worry: Not on file  . Food insecurity - inability: Not on file  . Transportation needs - medical: Not on file  . Transportation needs - non-medical: Not on file  Occupational History  . Not on file  Tobacco Use  . Smoking status: Current Every Day Smoker    Packs/day: 0.50    Types: Cigarettes  . Smokeless tobacco: Never Used  Substance and Sexual Activity  . Alcohol use: No  . Drug use: No  . Sexual activity: Not  on file  Other Topics Concern  . Not on file  Social History Narrative  . Not on file    FAMILY HISTORY: No family history on file.  ALLERGIES:  is allergic to allegra [fexofenadine]; clindamycin/lincomycin; influenza a (h1n1) monoval vac; keflex [cephalexin]; penicillins; prednisone; protonix [pantoprazole sodium]; and statins.  MEDICATIONS:  Current Outpatient Medications  Medication Sig Dispense Refill  . amLODipine (NORVASC) 5 MG tablet Take 1 tablet (5 mg total) by mouth daily. 30 tablet 0  . calcium-vitamin D (OSCAL WITH D) 500-200 MG-UNIT tablet Take 1 tablet by mouth 2 (two) times daily. 60 tablet 0  . Cholecalciferol (VITAMIN D) 2000 UNITS tablet Take 2,000 Units by mouth daily.    . diclofenac sodium (VOLTAREN) 1 % GEL Apply 4  g topically 4 (four) times daily as needed (arthritis pain).     Marland Kitchen doxycycline (DORYX) 100 MG EC tablet Take 100 mg by mouth 2 (two) times daily. Completed course 10/28    . estradiol (ESTRACE) 0.1 MG/GM vaginal cream Place 1 Applicatorful vaginally once a week. Fridays    . HYDROcodone-acetaminophen (NORCO/VICODIN) 5-325 MG per tablet Take 1 tablet by mouth every 6 (six) hours as needed for moderate pain.    . hyoscyamine (LEVBID) 0.375 MG 12 hr tablet Take 0.375 mg by mouth every 12 (twelve) hours as needed (blaader spasms).    Marland Kitchen levothyroxine (SYNTHROID) 88 MCG tablet Take 1 tablet (88 mcg total) by mouth daily before breakfast. 30 tablet 3  . omeprazole (PRILOSEC) 20 MG capsule Take 20 mg by mouth daily.    Marland Kitchen Phenylephrine-Bromphen-DM (PHENYLEPHRINE COMPLEX PO) Take 5 mg by mouth as directed. Takes every morning and may takes up to 2 additional tablets if needed for sinus congestion during the day    . ranitidine (ZANTAC) 150 MG tablet Take 150 mg by mouth at bedtime as needed for heartburn.    . saccharomyces boulardii (FLORASTOR) 250 MG capsule Take 250 mg by mouth daily as needed (only when on antibiotic).     No current facility-administered medications for this visit.     REVIEW OF SYSTEMS:    10 Point review of Systems was done is negative except as noted above.  PHYSICAL EXAMINATION: ECOG PERFORMANCE STATUS: 1 - Symptomatic but completely ambulatory  . Vitals:   05/08/17 0853  BP: (!) 146/85  Pulse: 93  Resp: 18  Temp: 97.7 F (36.5 C)  SpO2: 93%   Filed Weights   05/08/17 0853  Weight: 171 lb (77.6 kg)   .Body mass index is 26 kg/m.  GENERAL:alert, in no acute distress and comfortable SKIN: no acute rashes, no significant lesions (+) likely cyst on her distal inner thigh  EYES: conjunctiva are pink and non-injected, sclera anicteric OROPHARYNX: MMM, no exudates, no oropharyngeal erythema or ulceration NECK: supple, no JVD LYMPH:  no palpable lymphadenopathy in  the cervical, axillary or inguinal regions LUNGS: clear to auscultation b/l with normal respiratory effort HEART: regular rate & rhythm ABDOMEN:  normoactive bowel sounds , non tender, not distended. Extremity: no pedal edema PSYCH: alert & oriented x 3 with fluent speech NEURO: no focal motor/sensory deficits  LABORATORY DATA:  I have reviewed the data as listed  . CBC Latest Ref Rng & Units 05/08/2017 05/08/2017 01/28/2015  WBC 3.9 - 10.3 K/uL 12.5(H) - 11.6(H)  Hemoglobin 12.0 - 15.0 g/dL - - 14.8  Hematocrit 34.0 - 46.6 % 44.0 43.3 44.7  Platelets 145 - 400 K/uL 308 - 304   . CBC  Component Value Date/Time   WBC 12.5 (H) 05/08/2017 1033   WBC 11.6 (H) 01/28/2015 0940   RBC 4.38 05/08/2017 1033   RBC 4.38 05/08/2017 1033   HGB 14.8 01/28/2015 0940   HCT 43.3 05/08/2017 1033   HCT 44.0 05/08/2017 1033   PLT 308 05/08/2017 1033   MCV 100.5 05/08/2017 1033   MCH 34.0 05/08/2017 1033   MCHC 33.9 05/08/2017 1033   RDW 14.2 05/08/2017 1033   LYMPHSABS 2.8 05/08/2017 1033   MONOABS 0.8 05/08/2017 1033   EOSABS 0.2 05/08/2017 1033   BASOSABS 0.0 05/08/2017 1033     . CMP Latest Ref Rng & Units 05/08/2017 02/05/2015 01/28/2015  Glucose 70 - 140 mg/dL 121 121(H) 97  BUN 7 - 26 mg/dL 13 11 14   Creatinine 0.60 - 1.10 mg/dL 0.87 0.82 0.75  Sodium 136 - 145 mmol/L 139 136 141  Potassium 3.5 - 5.1 mmol/L 4.5 4.4 4.8  Chloride 98 - 109 mmol/L 106 101 107  CO2 22 - 29 mmol/L 24 29 26   Calcium 8.4 - 10.4 mg/dL 10.0 10.2 10.2  Total Protein 6.4 - 8.3 g/dL 7.8 - -  Total Bilirubin 0.2 - 1.2 mg/dL 0.5 - -  Alkaline Phos 40 - 150 U/L 99 - -  AST 5 - 34 U/L 18 - -  ALT 0 - 55 U/L 25 - -   Component     Latest Ref Rng & Units 05/08/2017  IgG (Immunoglobin G), Serum     700 - 1,600 mg/dL 910  IgA     87 - 352 mg/dL 144  IgM (Immunoglobulin M), Srm     26 - 217 mg/dL 127  Total Protein ELP     6.0 - 8.5 g/dL 7.5  Albumin SerPl Elph-Mcnc     2.9 - 4.4 g/dL 4.0  Alpha 1      0.0 - 0.4 g/dL 0.3  Alpha2 Glob SerPl Elph-Mcnc     0.4 - 1.0 g/dL 1.1 (H)  B-Globulin SerPl Elph-Mcnc     0.7 - 1.3 g/dL 1.1  Gamma Glob SerPl Elph-Mcnc     0.4 - 1.8 g/dL 1.0  M Protein SerPl Elph-Mcnc     Not Observed g/dL Not Observed  Globulin, Total     2.2 - 3.9 g/dL 3.5  Albumin/Glob SerPl     0.7 - 1.7 1.2  IFE 1      Comment  Please Note (HCV):      Comment  TSH     0.450 - 4.500 uIU/mL 1.230  Thyroxine (T4)     4.5 - 12.0 ug/dL 9.4  T3 Uptake Ratio     24 - 39 % 28  Free Thyroxine Index     1.2 - 4.9 2.6  Folate, Hemolysate     Not Estab. ng/mL 327.4  HCT     34.0 - 46.6 % 43.3  Folate, RBC     >498 ng/mL 756  Retic Ct Pct     0.7 - 2.1 % 1.7  RBC.     3.70 - 5.45 MIL/uL 4.38  Retic Count, Absolute     33.7 - 90.7 K/uL 74.5  Vitamin B12     180 - 914 pg/mL 257  Haptoglobin     34 - 200 mg/dL 209 (H)  LDH     125 - 245 U/L 171         RADIOGRAPHIC STUDIES: I have personally reviewed the radiological images as listed and agreed with the findings  in the report. No results found.  ASSESSMENT & PLAN:  ARNETIA BRONK is a 72 y.o. female with    1. RBC Macrocytosis   No enlarged LN, spleen or liver upon exam.   No evidence of hemolysis with nl LDH and haptoglobin levels No overt reticulocytosis B12 levels low at 257 Nl Folate Myeloma panel - no m spike. TSH WNL with current levothyroxine replacement. Denies ETOH use No overt medications causing macrocytosis  PLAN -I discussed her recent increase in size of her red blood cells without significant anemia and the possible causes. -She does not present with anemia -It is very possible her change in thyroid medication and thyroid levels caused her macrocytosis and will resolve once her thyroid levels have stabilized.  -she alos appears to have low B12 levels and could have other associated B vitamin deficiencies -would recommend B12 1000 mcg SL daily to maintain B12 levels >500 -additional  would recommend taking B complex 1 cap po daily   3. Hypothyroidism -after total thyroidectomy in 01/2015 -currently on 118mcg synthroid -managed by Dr. Stephanie Acre   4. Uveitis of the eye, recurrent  -currently not active -I suggest possible autoimmune/inflammatory workup with Dr. Briant Cedar today done as noted as above RTC with [facility] as needed based on lab results.    All of the patients questions were answered with apparent satisfaction. The patient knows to call the clinic with any problems, questions or concerns.  I spent 40 minutes counseling the patient face to face. The total time spent in the appointment was 45 minutes and more than 50% was on counseling and direct patient cares.    Sullivan Lone MD [place] AAHIVMS [facility] Hematology/Oncology Physician [facility]  (Office):       [phone] (Work cell):  [phone] (Fax):           [phone]  05/08/2017 9:04 AM  This document serves as a record of services personally performed by Sullivan Lone, MD. It was created on his behalf by Joslyn Devon, a trained medical scribe. The creation of this record is based on the scribe's personal observations and the provider's statements to them.    .I have reviewed the above documentation for accuracy and completeness, and I agree with the above. Brunetta Genera MD MS

## 2017-05-08 ENCOUNTER — Inpatient Hospital Stay: Payer: Medicare Other

## 2017-05-08 ENCOUNTER — Inpatient Hospital Stay: Payer: Medicare Other | Attending: Hematology | Admitting: Hematology

## 2017-05-08 ENCOUNTER — Telehealth: Payer: Self-pay | Admitting: Hematology

## 2017-05-08 VITALS — BP 146/85 | HR 93 | Temp 97.7°F | Resp 18 | Ht 68.0 in | Wt 171.0 lb

## 2017-05-08 DIAGNOSIS — E890 Postprocedural hypothyroidism: Secondary | ICD-10-CM | POA: Diagnosis not present

## 2017-05-08 DIAGNOSIS — D7589 Other specified diseases of blood and blood-forming organs: Secondary | ICD-10-CM | POA: Diagnosis not present

## 2017-05-08 DIAGNOSIS — E538 Deficiency of other specified B group vitamins: Secondary | ICD-10-CM

## 2017-05-08 DIAGNOSIS — M1990 Unspecified osteoarthritis, unspecified site: Secondary | ICD-10-CM | POA: Diagnosis not present

## 2017-05-08 DIAGNOSIS — H209 Unspecified iridocyclitis: Secondary | ICD-10-CM | POA: Diagnosis not present

## 2017-05-08 DIAGNOSIS — K219 Gastro-esophageal reflux disease without esophagitis: Secondary | ICD-10-CM | POA: Insufficient documentation

## 2017-05-08 DIAGNOSIS — Z79899 Other long term (current) drug therapy: Secondary | ICD-10-CM | POA: Diagnosis not present

## 2017-05-08 DIAGNOSIS — F17210 Nicotine dependence, cigarettes, uncomplicated: Secondary | ICD-10-CM | POA: Diagnosis not present

## 2017-05-08 LAB — CMP (CANCER CENTER ONLY)
ALK PHOS: 99 U/L (ref 40–150)
ALT: 25 U/L (ref 0–55)
ANION GAP: 9 (ref 3–11)
AST: 18 U/L (ref 5–34)
Albumin: 4.2 g/dL (ref 3.5–5.0)
BUN: 13 mg/dL (ref 7–26)
CALCIUM: 10 mg/dL (ref 8.4–10.4)
CHLORIDE: 106 mmol/L (ref 98–109)
CO2: 24 mmol/L (ref 22–29)
Creatinine: 0.87 mg/dL (ref 0.60–1.10)
GFR, Estimated: >60 mL/min (ref >60)
Glucose, Bld: 121 mg/dL (ref 70–140)
Potassium: 4.5 mmol/L (ref 3.5–5.1)
SODIUM: 139 mmol/L (ref 136–145)
Total Bilirubin: 0.5 mg/dL (ref 0.2–1.2)
Total Protein: 7.8 g/dL (ref 6.4–8.3)

## 2017-05-08 LAB — CBC WITH DIFFERENTIAL (CANCER CENTER ONLY)
BASOS PCT: 0 %
Basophils Absolute: 0 10*3/uL (ref 0.0–0.1)
Eosinophils Absolute: 0.2 10*3/uL (ref 0.0–0.5)
Eosinophils Relative: 1 %
HEMATOCRIT: 44 % (ref 34.8–46.6)
HEMOGLOBIN: 14.9 g/dL (ref 11.6–15.9)
Lymphocytes Relative: 22 %
Lymphs Abs: 2.8 10*3/uL (ref 0.9–3.3)
MCH: 34 pg (ref 25.1–34.0)
MCHC: 33.9 g/dL (ref 31.5–36.0)
MCV: 100.5 fL (ref 79.5–101.0)
MONOS PCT: 6 %
Monocytes Absolute: 0.8 10*3/uL (ref 0.1–0.9)
NEUTROS ABS: 8.7 10*3/uL — AB (ref 1.5–6.5)
NEUTROS PCT: 71 %
Platelet Count: 308 10*3/uL (ref 145–400)
RBC: 4.38 MIL/uL (ref 3.70–5.45)
RDW: 14.2 % (ref 11.2–14.5)
WBC Count: 12.5 10*3/uL — ABNORMAL HIGH (ref 3.9–10.3)

## 2017-05-08 LAB — VITAMIN B12: Vitamin B-12: 257 pg/mL (ref 180–914)

## 2017-05-08 LAB — RETICULOCYTES
RBC.: 4.38 MIL/uL (ref 3.70–5.45)
Retic Count, Absolute: 74.5 10*3/uL (ref 33.7–90.7)
Retic Ct Pct: 1.7 % (ref 0.7–2.1)

## 2017-05-08 LAB — LACTATE DEHYDROGENASE: LDH: 171 U/L (ref 125–245)

## 2017-05-08 NOTE — Telephone Encounter (Signed)
Scheduled appt per 2/12 los - RTC as needed.,

## 2017-05-09 DIAGNOSIS — L570 Actinic keratosis: Secondary | ICD-10-CM | POA: Diagnosis not present

## 2017-05-09 DIAGNOSIS — L0291 Cutaneous abscess, unspecified: Secondary | ICD-10-CM | POA: Diagnosis not present

## 2017-05-09 DIAGNOSIS — L720 Epidermal cyst: Secondary | ICD-10-CM | POA: Diagnosis not present

## 2017-05-09 DIAGNOSIS — X32XXXA Exposure to sunlight, initial encounter: Secondary | ICD-10-CM | POA: Diagnosis not present

## 2017-05-09 LAB — FOLATE RBC
Folate, Hemolysate: 327.4 ng/mL
Folate, RBC: 756 ng/mL (ref >498)
Hematocrit: 43.3 % (ref 34.0–46.6)

## 2017-05-09 LAB — THYROID PANEL WITH TSH
FREE THYROXINE INDEX: 2.6 (ref 1.2–4.9)
T3 UPTAKE RATIO: 28 % (ref 24–39)
T4 TOTAL: 9.4 ug/dL (ref 4.5–12.0)
TSH: 1.23 u[IU]/mL (ref 0.450–4.500)

## 2017-05-09 LAB — HAPTOGLOBIN: Haptoglobin: 209 mg/dL — ABNORMAL HIGH (ref 34–200)

## 2017-05-11 LAB — MULTIPLE MYELOMA PANEL, SERUM
ALBUMIN/GLOB SERPL: 1.2 (ref 0.7–1.7)
ALPHA2 GLOB SERPL ELPH-MCNC: 1.1 g/dL — AB (ref 0.4–1.0)
Albumin SerPl Elph-Mcnc: 4 g/dL (ref 2.9–4.4)
Alpha 1: 0.3 g/dL (ref 0.0–0.4)
B-GLOBULIN SERPL ELPH-MCNC: 1.1 g/dL (ref 0.7–1.3)
Gamma Glob SerPl Elph-Mcnc: 1 g/dL (ref 0.4–1.8)
Globulin, Total: 3.5 g/dL (ref 2.2–3.9)
IGG (IMMUNOGLOBIN G), SERUM: 910 mg/dL (ref 700–1600)
IGM (IMMUNOGLOBULIN M), SRM: 127 mg/dL (ref 26–217)
IgA: 144 mg/dL (ref 87–352)
Total Protein ELP: 7.5 g/dL (ref 6.0–8.5)

## 2017-05-16 ENCOUNTER — Telehealth: Payer: Self-pay | Admitting: *Deleted

## 2017-05-16 NOTE — Telephone Encounter (Signed)
Per Dr. Irene Limbo, called patient to inform her to begin taking B12 SL 1046mcg daily and a B complex daily due to low vit B12 levels.  Pt verbalized understanding.  Pt to follow up with PCP on B12 levels.  Dr. Irene Limbo will see back on an as needed basis.

## 2017-06-12 DIAGNOSIS — B9689 Other specified bacterial agents as the cause of diseases classified elsewhere: Secondary | ICD-10-CM | POA: Diagnosis not present

## 2017-06-12 DIAGNOSIS — L02415 Cutaneous abscess of right lower limb: Secondary | ICD-10-CM | POA: Diagnosis not present

## 2017-07-31 DIAGNOSIS — H6122 Impacted cerumen, left ear: Secondary | ICD-10-CM | POA: Diagnosis not present

## 2017-07-31 DIAGNOSIS — J370 Chronic laryngitis: Secondary | ICD-10-CM | POA: Diagnosis not present

## 2017-09-07 DIAGNOSIS — Z1283 Encounter for screening for malignant neoplasm of skin: Secondary | ICD-10-CM | POA: Diagnosis not present

## 2017-09-07 DIAGNOSIS — L820 Inflamed seborrheic keratosis: Secondary | ICD-10-CM | POA: Diagnosis not present

## 2017-11-12 DIAGNOSIS — H2013 Chronic iridocyclitis, bilateral: Secondary | ICD-10-CM | POA: Diagnosis not present

## 2017-11-12 DIAGNOSIS — H04123 Dry eye syndrome of bilateral lacrimal glands: Secondary | ICD-10-CM | POA: Diagnosis not present

## 2017-11-15 DIAGNOSIS — E049 Nontoxic goiter, unspecified: Secondary | ICD-10-CM | POA: Diagnosis not present

## 2017-11-15 DIAGNOSIS — H209 Unspecified iridocyclitis: Secondary | ICD-10-CM | POA: Diagnosis not present

## 2017-11-15 DIAGNOSIS — D759 Disease of blood and blood-forming organs, unspecified: Secondary | ICD-10-CM | POA: Diagnosis not present

## 2017-11-15 DIAGNOSIS — I129 Hypertensive chronic kidney disease with stage 1 through stage 4 chronic kidney disease, or unspecified chronic kidney disease: Secondary | ICD-10-CM | POA: Diagnosis not present

## 2017-11-15 DIAGNOSIS — G894 Chronic pain syndrome: Secondary | ICD-10-CM | POA: Diagnosis not present

## 2017-11-15 DIAGNOSIS — Z79899 Other long term (current) drug therapy: Secondary | ICD-10-CM | POA: Diagnosis not present

## 2017-11-15 DIAGNOSIS — E2839 Other primary ovarian failure: Secondary | ICD-10-CM | POA: Diagnosis not present

## 2017-11-15 DIAGNOSIS — Z0000 Encounter for general adult medical examination without abnormal findings: Secondary | ICD-10-CM | POA: Diagnosis not present

## 2017-11-15 DIAGNOSIS — I10 Essential (primary) hypertension: Secondary | ICD-10-CM | POA: Diagnosis not present

## 2017-11-15 DIAGNOSIS — E538 Deficiency of other specified B group vitamins: Secondary | ICD-10-CM | POA: Diagnosis not present

## 2017-11-15 DIAGNOSIS — N3010 Interstitial cystitis (chronic) without hematuria: Secondary | ICD-10-CM | POA: Diagnosis not present

## 2017-11-15 DIAGNOSIS — E559 Vitamin D deficiency, unspecified: Secondary | ICD-10-CM | POA: Diagnosis not present

## 2017-11-15 DIAGNOSIS — N182 Chronic kidney disease, stage 2 (mild): Secondary | ICD-10-CM | POA: Diagnosis not present

## 2017-11-15 DIAGNOSIS — L301 Dyshidrosis [pompholyx]: Secondary | ICD-10-CM | POA: Diagnosis not present

## 2017-11-28 DIAGNOSIS — E538 Deficiency of other specified B group vitamins: Secondary | ICD-10-CM | POA: Diagnosis not present

## 2017-12-18 DIAGNOSIS — E538 Deficiency of other specified B group vitamins: Secondary | ICD-10-CM | POA: Diagnosis not present

## 2017-12-27 DIAGNOSIS — E2839 Other primary ovarian failure: Secondary | ICD-10-CM | POA: Diagnosis not present

## 2017-12-31 DIAGNOSIS — E538 Deficiency of other specified B group vitamins: Secondary | ICD-10-CM | POA: Diagnosis not present

## 2018-01-03 DIAGNOSIS — Z1231 Encounter for screening mammogram for malignant neoplasm of breast: Secondary | ICD-10-CM | POA: Diagnosis not present

## 2018-01-15 DIAGNOSIS — N6324 Unspecified lump in the left breast, lower inner quadrant: Secondary | ICD-10-CM | POA: Diagnosis not present

## 2018-01-16 ENCOUNTER — Other Ambulatory Visit: Payer: Self-pay | Admitting: Radiology

## 2018-01-16 DIAGNOSIS — N6324 Unspecified lump in the left breast, lower inner quadrant: Secondary | ICD-10-CM | POA: Diagnosis not present

## 2018-01-16 DIAGNOSIS — C50312 Malignant neoplasm of lower-inner quadrant of left female breast: Secondary | ICD-10-CM | POA: Diagnosis not present

## 2018-01-19 DIAGNOSIS — N390 Urinary tract infection, site not specified: Secondary | ICD-10-CM | POA: Diagnosis not present

## 2018-01-19 DIAGNOSIS — R319 Hematuria, unspecified: Secondary | ICD-10-CM | POA: Diagnosis not present

## 2018-01-24 ENCOUNTER — Encounter: Payer: Self-pay | Admitting: *Deleted

## 2018-01-24 ENCOUNTER — Telehealth: Payer: Self-pay | Admitting: Oncology

## 2018-01-24 DIAGNOSIS — Z170 Estrogen receptor positive status [ER+]: Principal | ICD-10-CM

## 2018-01-24 DIAGNOSIS — C50312 Malignant neoplasm of lower-inner quadrant of left female breast: Secondary | ICD-10-CM

## 2018-01-24 DIAGNOSIS — C50912 Malignant neoplasm of unspecified site of left female breast: Secondary | ICD-10-CM | POA: Diagnosis not present

## 2018-01-24 DIAGNOSIS — E538 Deficiency of other specified B group vitamins: Secondary | ICD-10-CM | POA: Diagnosis not present

## 2018-01-24 DIAGNOSIS — N3000 Acute cystitis without hematuria: Secondary | ICD-10-CM | POA: Diagnosis not present

## 2018-01-24 HISTORY — DX: Malignant neoplasm of lower-inner quadrant of left female breast: C50.312

## 2018-01-24 HISTORY — DX: Estrogen receptor positive status (ER+): Z17.0

## 2018-01-24 NOTE — Telephone Encounter (Signed)
Spoke with patient to confirm afternoon [facility] appointment for 11/6, [place] patient, will send appointment reminder

## 2018-01-29 NOTE — Progress Notes (Signed)
[place]  [phone] [phone]     ID: Margaret Hodges DOB: April 02, 1945  MR#: [id number]  NTZ#:[id number]  Patient Care Team: [name], MD as PCP - General (Family Medicine) Fanny Skates, MD as Consulting Physician (General Surgery) Magrinat, Virgie Dad, MD as Consulting Physician (Oncology) Kyung Rudd, MD as Consulting Physician (Radiation Oncology) Allyn Kenner, MD (Dermatology) OTHER MD:  CHIEF COMPLAINT: Estrogen receptor positive breast cancer  CURRENT TREATMENT: Awaiting definitive surgery   HISTORY OF CURRENT ILLNESS: Margaret Hodges had routine screening mammography on 01/03/2018 showing a possible abnormality in the left breast. She underwent bilateral diagnostic mammography with tomography and left breast ultrasonography at [facility] on 01/15/2018 showing: Breast density category B; a 1.3 cm round solid mass with an indistinct margin in the left breast at 7 o'clock posterior 8 cm from the nipple. No significant abnormalities found in the left axilla.  Accordingly on 01/16/2018 she proceeded to biopsy of the left breast area in question. The pathology from this procedure showed [phone]): Invasive ductal carcinoma, grade III. Prognostic indicators significant for: estrogen receptor, 60% positive, with weak staining intensity; progesterone receptor negative, Proliferation marker Ki67 at 80%. HER2 positive by immunohistochemistry, 3+.  The patient's subsequent history is as detailed below.  INTERVAL HISTORY: Margaret Hodges was evaluated in the multidisciplinary breast cancer clinic on 02/03/2018 accompanied by daughter, Margaret Hodges.  The patient's case was also presented at the multidisciplinary breast cancer conference on the same day. At that time a preliminary plan was proposed: Breast conserving surgery with sentinel lymph node biopsy, adjuvant chemotherapy with anti-HER-2 therapy, followed by adjuvant radiation then antiestrogens, genetics  testing   REVIEW OF SYSTEMS: Margaret Hodges reports that for exercise, she normally goes to a gym for exercise. She is very active outside of her home as well. Her husband has a rare brain disease and she is his primary caregiver: she is unable to leave her husband alone for long periods of time.  The patient is on antibiotics for an UTI. She used to take Estrace for vaginal atrophy symptoms. There were no specific symptoms leading to the recent screening mammogram, which was routinely scheduled. The patient denies unusual headaches, visual changes, nausea, vomiting, stiff neck, dizziness, or gait imbalance. There has been no cough, phlegm production, or pleurisy, no chest pain or pressure, and no change in bowel or bladder habits. The patient denies fever, rash, bleeding, unexplained fatigue or unexplained weight loss. A detailed review of systems was otherwise entirely negative.  PAST MEDICAL HISTORY: Past Medical History:  Diagnosis Date  . Anginal pain ([place])    with admission to hospital due to high blood pressure  . GERD (gastroesophageal reflux disease)   . Guillain Barr syndrome ([place]) 1959  . Guillain Barr syndrome ([place]) 1959  . Guillain Barr syndrome ([place]) 1959  . Headache    sinus headaches  . History of hiatal hernia    was told pt. had one, but no testing  . Hypertensive urgency 04/05/2014  . Malignant neoplasm of lower-inner quadrant of left breast in female, estrogen receptor positive ([place]) 01/24/2018  . Neuromuscular disorder ([place])    Guillian- Barre Syndrome  . Pneumonia    hx. of walking pneumonia    PAST SURGICAL HISTORY: Past Surgical History:  Procedure Laterality Date  . ABDOMINAL HYSTERECTOMY     Total  . Bladder lift  15 years ago,  Uterus prolapse causing intestinal damage and surgery    . Right torn meniscus repair x 2    .  THYROID LOBECTOMY N/A 02/04/2015   Procedure: TOTAL THYROIDECTOMY;  Surgeon: Armandina Gemma, MD;  Location: [facility];  Service: General;   Laterality: N/A;  . TONSILLECTOMY    . TUMOR REMOVAL    . Tumors on arms removed,  Tumor on gum removed,  Tumor removed from inside ear      FAMILY HISTORY Family History  Problem Relation Age of Onset  . Ovarian cancer Sister   . Lung cancer Sister   . Throat cancer Brother   . Brain cancer Sister    She notes that her father died from old age at age 34. Patients' mother died from multiple comorbidities at age 20. The patient has 5 brothers and 6 sisters. Patients' sister had ovarian cancer at age 12 and lung cancer at 21, brother with throat cancer at age 38, and sister with brain cancer at age 9.   GYNECOLOGIC HISTORY:  No LMP recorded. Patient has had a hysterectomy. 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 Her husband was a Librarian, academic at [employer]. Her son, Margaret Hodges manages a call center for medical supplies. Her daughter, Margaret Hodges, is Patient Care surgical coordinator at [employer] clinic.  The patient has 2 grandchildren. She attends [employer].     ADVANCED DIRECTIVES: Her [place] is her daughter, Margaret Hodges and can be reached at [phone].     HEALTH MAINTENANCE: Social History   Tobacco Use  . Smoking status: Current Every Day Smoker    Packs/day: 0.50    Types: Cigarettes  . Smokeless tobacco: Never Used  Substance Use Topics  . Alcohol use: No  . Drug use: No     Colonoscopy: Yes through [facility]  PAP:   Bone density:    Allergies  Allergen Reactions  . Allegra [Fexofenadine] Hives  . Clindamycin/Lincomycin Hives and Diarrhea  . Influenza A (H1n1) Mon[place] Vac Other (See Comments)    Guillian Barre Syndrome  . Keflex [Cephalexin] Hives  . Penicillins Swelling    Has patient had a PCN  reaction causing immediate rash, facial/tongue/throat swelling, SOB or lightheadedness with hypotension: No Has patient had a PCN reaction causing severe rash involving mucus membranes or skin necrosis: No Has patient had a PCN reaction that required hospitalization No Has patient had a PCN reaction occurring within the last 10 years: No If all of the above answers are "NO", then may proceed with Cephalosporin use.   . Prednisone Other (See Comments)    "heart attack symptoms" but even had heart cath & had no cardiac disease  . Protonix [Pantoprazole Sodium] Diarrhea    Tolerates Prilosec  . Statins Other (See Comments)    myalgias    Current Outpatient Medications  Medication Sig Dispense Refill  . amLODipine (NORVASC) 5 MG tablet Take 1 tablet (5 mg total) by mouth daily. 30 tablet 0  . Cholecalciferol (VITAMIN D) 2000 UNITS tablet Take 2,000 Units by mouth daily.    . diclofenac sodium (VOLTAREN) 1 % GEL Apply 4 g topically 4 (four) times daily as needed (arthritis pain).     Marland Kitchen estradiol (ESTRACE) 0.1 MG/GM vaginal cream Place 1 Applicatorful vaginally once a week. Fridays    . HYDROcodone-acetaminophen (NORCO/VICODIN) 5-325 MG per tablet Take 1 tablet by mouth  every 6 (six) hours as needed for moderate pain.    . hyoscyamine (LEVBID) 0.375 MG 12 hr tablet Take 0.375 mg by mouth every 12 (twelve) hours as needed (blaader spasms).    Marland Kitchen levothyroxine (SYNTHROID) 88 MCG tablet Take 1 tablet (88 mcg total) by mouth daily before breakfast. 30 tablet 3  . omeprazole (PRILOSEC) 20 MG capsule Take 20 mg by mouth daily.    . calcium-vitamin D (OSCAL WITH D) 500-200 MG-UNIT tablet Take 1 tablet by mouth 2 (two) times daily. (Patient not taking: Reported on 01/30/2018) 60 tablet 0  . Phenylephrine-Bromphen-DM (PHENYLEPHRINE COMPLEX PO) Take 5 mg by mouth as directed. Takes every morning and may takes up to 2 additional tablets if needed for sinus congestion during the day    . ranitidine (ZANTAC)  150 MG tablet Take 150 mg by mouth at bedtime as needed for heartburn.    . saccharomyces boulardii (FLORASTOR) 250 MG capsule Take 250 mg by mouth daily as needed (only when on antibiotic).     No current facility-administered medications for this visit.     OBJECTIVE: Middle-aged white woman who appears stated age  54:   01/30/18 1253  BP: 129/77  Pulse: 87  Resp: 18  Temp: 97.7 F (36.5 C)  SpO2: 98%     Body mass index is 25.06 kg/m.   Wt Readings from Last 3 Encounters:  01/30/18 164 lb 12.8 oz (74.8 kg)  05/08/17 171 lb (77.6 kg)  02/04/15 165 lb (74.8 kg)      ECOG FS:1 - Symptomatic but completely ambulatory  Ocular: Sclerae unicteric, pupils round and equal Ear-nose-throat: Oropharynx clear and moist Lymphatic: No cervical or supraclavicular adenopathy Lungs no rales or rhonchi Heart regular rate and rhythm Abd soft, nontender, positive bowel sounds MSK no focal spinal tenderness, no joint edema Neuro: non-focal, well-oriented, appropriate affect Breasts: The right breast is unremarkable.  The left breast is status post recent biopsy.  I do not palpate a mass.  There are no skin or nipple changes of concern.  Both axillae are benign.   LAB RESULTS:  CMP     Component Value Date/Time   NA 139 01/30/2018 1205   K 4.5 01/30/2018 1205   CL 106 01/30/2018 1205   CO2 26 01/30/2018 1205   GLUCOSE 103 (H) 01/30/2018 1205   BUN 16 01/30/2018 1205   CREATININE 0.95 01/30/2018 1205   CALCIUM 9.8 01/30/2018 1205   PROT 7.5 01/30/2018 1205   ALBUMIN 3.9 01/30/2018 1205   AST 13 (L) 01/30/2018 1205   ALT 17 01/30/2018 1205   ALKPHOS 89 01/30/2018 1205   BILITOT <0.2 (L) 01/30/2018 1205   GFRNONAA 59 (L) 01/30/2018 1205   GFRAA >60 01/30/2018 1205    Lab Results  Component Value Date   TOTALPROTELP 7.5 05/08/2017    No results found for: KPAFRELGTCHN, LAMBDASER, KAPLAMBRATIO  Lab Results  Component Value Date   WBC 13.1 (H) 01/30/2018   NEUTROABS  7.8 (H) 01/30/2018   HGB 13.8 01/30/2018   HCT 42.1 01/30/2018   MCV 100.5 (H) 01/30/2018   PLT 333 01/30/2018    _0 @  No results found for: LABCA2  No components found for: PYKDXI338  No results for input(s): INR in the last 168 hours.  No results found for: LABCA2  No results found for: SNK539  No results found for: JQB341  No results found for: PFX902  No results found for: CA2729  No components found for: HGQUANT  No results  found for: CEA1 / No results found for: CEA1   No results found for: AFPTUMOR  No results found for: [place]  No results found for: PSA1  Appointment on 01/30/2018  Component Date Value Ref Range Status  . Sodium 01/30/2018 139  135 - 145 mmol/L Final  . Potassium 01/30/2018 4.5  3.5 - 5.1 mmol/L Final  . Chloride 01/30/2018 106  98 - 111 mmol/L Final  . CO2 01/30/2018 26  22 - 32 mmol/L Final  . Glucose, Bld 01/30/2018 103* 70 - 99 mg/dL Final  . BUN 01/30/2018 16  8 - 23 mg/dL Final  . Creatinine 01/30/2018 0.95  0.44 - 1.00 mg/dL Final  . Calcium 01/30/2018 9.8  8.9 - 10.3 mg/dL Final  . Total Protein 01/30/2018 7.5  6.5 - 8.1 g/dL Final  . Albumin 01/30/2018 3.9  3.5 - 5.0 g/dL Final  . AST 01/30/2018 13* 15 - 41 U/L Final  . ALT 01/30/2018 17  0 - 44 U/L Final  . Alkaline Phosphatase 01/30/2018 89  38 - 126 U/L Final  . Total Bilirubin 01/30/2018 <0.2* 0.3 - 1.2 mg/dL Final  . GFR, Est Non Af Am 01/30/2018 59* >60 mL/min Final  . GFR, Est AFR Am 01/30/2018 >60  >60 mL/min Final   Comment: (NOTE) The eGFR has been calculated using the CKD EPI equation. This calculation has not been validated in all clinical situations. eGFR's persistently <60 mL/min signify possible Chronic Kidney Disease.   Margaret Hodges 01/30/2018 7  5 - 15 Final   Performed at [facility], [place] [address], [place], [place] [address]  . WBC Count 01/30/2018 13.1* 4.0 - 10.5 K/uL Final  . RBC 01/30/2018 4.19  3.87 - 5.11  MIL/uL Final  . Hemoglobin 01/30/2018 13.8  12.0 - 15.0 g/dL Final  . HCT 01/30/2018 42.1  36.0 - 46.0 % Final  . MCV 01/30/2018 100.5* 80.0 - 100.0 fL Final  . MCH 01/30/2018 32.9  26.0 - 34.0 pg Final  . MCHC 01/30/2018 32.8  30.0 - 36.0 g/dL Final  . RDW 01/30/2018 13.2  11.5 - 15.5 % Final  . Platelet Count 01/30/2018 333  150 - 400 K/uL Final  . nRBC 01/30/2018 0.0  0.0 - 0.2 % Final  . Neutrophils Relative % 01/30/2018 59  % Final  . Neutro Abs 01/30/2018 7.8* 1.7 - 7.7 K/uL Final  . Lymphocytes Relative 01/30/2018 30  % Final  . Lymphs Abs 01/30/2018 3.9  0.7 - 4.0 K/uL Final  . Monocytes Relative 01/30/2018 7  % Final  . Monocytes Absolute 01/30/2018 0.9  0.1 - 1.0 K/uL Final  . Eosinophils Relative 01/30/2018 3  % Final  . Eosinophils Absolute 01/30/2018 0.4  0.0 - 0.5 K/uL Final  . Basophils Relative 01/30/2018 1  % Final  . Basophils Absolute 01/30/2018 0.1  0.0 - 0.1 K/uL Final  . Immature Granulocytes 01/30/2018 0  % Final  . Abs Immature Granulocytes 01/30/2018 0.04  0.00 - 0.07 K/uL Final   Performed at [facility], [place] [address], [place], [place] [address]    (this displays the last labs from the last 3 days)  Lab Results  Component Value Date   TOTALPROTELP 7.5 05/08/2017   (this displays SPEP labs)  No results found for: KPAFRELGTCHN, LAMBDASER, KAPLAMBRATIO (kappa/lambda light chains)  No results found for: HGBA, HGBA2QUANT, HGBFQUANT, HGBSQUAN (Hemoglobinopathy evaluation)   Lab Results  Component Value Date   LDH 171 05/08/2017  No results found for: IRON, TIBC, IRONPCTSAT (Iron and TIBC)  No results found for: FERRITIN  Urinalysis    Component Value Date/Time   COLORURINE YELLOW 04/04/2014 2112   APPEARANCEUR CLEAR 04/04/2014 2112   LABSPEC 1.017 04/04/2014 2112   PHURINE 6.0 04/04/2014 2112   GLUCOSEU NEGATIVE 04/04/2014 2112   HGBUR NEGATIVE 04/04/2014 2112   BILIRUBINUR NEGATIVE 04/04/2014 2112   [place]  NEGATIVE 04/04/2014 2112   PROTEINUR NEGATIVE 04/04/2014 2112   UROBILINOGEN 0.2 04/04/2014 2112   NITRITE NEGATIVE 04/04/2014 2112   LEUKOCYTESUR NEGATIVE 04/04/2014 2112     STUDIES:  Mammographic studies and biopsies discussed with the patient  ELIGIBLE FOR AVAILABLE RESEARCH PROTOCOL: no  ASSESSMENT: 72 y.o. [place] woman status post left breast lower inner quadrant biopsy 01/16/2018 for a clinical T1c N0, stage IA invasive ductal carcinoma, grade 3, estrogen receptor weakly positive, progesterone receptor negative, with an MIB-1 of 80%, and HER-2 amplified  (1) genetics 03/06/2018  (2) breast conserving surgery with sentinel lymph node sampling planned  (3) adjuvant chemo immunotherapy to consist of paclitaxel/trastuzumab weekly for 12 weeks  (4) trastuzumab to be continued to complete a year  (5) adjuvant radiation as appropriate  (6) antiestrogens to follow at the completion of local treatment.  (7) tobacco abuse disorder: The patient has been strongly advised to discontinue smoking   PLAN: I spent approximately 60 minutes face to face with Margaret Hodges with more than 50% of that time spent in counseling and coordination of care. Specifically we reviewed the biology of the patient's diagnosis and the specifics of her situation.  We first reviewed the fact that cancer is not one disease but more than 100 different diseases and that it is important to keep them separate-- otherwise when friends and relatives discuss their own cancer experiences with Margaret Hodges confusion can result. Similarly we explained that if breast cancer spreads to the bone or liver, the patient would not have bone cancer or liver cancer, but breast cancer in the bone and breast cancer in the liver: one cancer in three places-- not 3 different cancers which otherwise would have to be treated in 3 different ways.  We discussed the difference between local and systemic therapy. In terms of loco-regional treatment,  lumpectomy plus radiation is equivalent to mastectomy as far as survival is concerned. For this reason, and because the cosmetic results are generally superior, we recommend breast conserving surgery.  We then discussed the rationale for systemic therapy. There is some risk that this cancer may have already spread to other parts of her body. Patients frequently ask at this point about bone scans, CAT scans and PET scans to find out if they have occult breast cancer somewhere else. The problem is that in early stage disease we are much more likely to find false positives then true cancers and this would expose the patient to unnecessary procedures as well as unnecessary radiation. Scans cannot answer the question the patient really would like to know, which is whether she has microscopic disease elsewhere in her body. For those reasons we do not recommend them.  Of course we would proceed to aggressive evaluation of any symptoms that might suggest metastatic disease, but that is not the case here.  Next we went over the options for systemic therapy which are anti-estrogens, anti-HER-2 immunotherapy, and chemotherapy. Emaan meets criteria for all 3.  Specifically we discussed chemotherapy with paclitaxel weekly x12 together with trastuzumab which will be given every 3 weeks to total 1 year.  She will benefit from adjuvant radiation after the paclitaxel is completed antiestrogens at the completion of radiation  Margaret Hodges also qualifies for genetics testing. In patients who carry a deleterious mutation [for example in a  BRCA gene], the risk of a new breast cancer developing in the future may be sufficiently great that the patient may choose bilateral mastectomies. However if she wishes to keep her breasts in that situation it is safe to do so. That would require intensified screening, which generally means not only yearly mammography but a yearly breast MRI as well.  We hope to have these results available to her  before she has her definitive surgery.  Margaret Hodges has a good understanding of the overall plan. She agrees with it. She knows the goal of treatment in her case is cure. She will call with any problems that may develop before her next visit here.  Magrinat, Virgie Dad, MD  02/03/18 11:28 AM Medical Oncology and Hematology [facility] [address] [place], [place] [address] Tel. [phone]    Fax. [phone]    I, Soijett Blue am acting as scribe for Dr. Sarajane Jews C. Magrinat.  I, Lurline Del MD, have reviewed the above documentation for accuracy and completeness, and I agree with the above.

## 2018-01-30 ENCOUNTER — Ambulatory Visit: Payer: Medicare Other | Attending: General Surgery | Admitting: Physical Therapy

## 2018-01-30 ENCOUNTER — Inpatient Hospital Stay: Payer: Medicare Other | Attending: Oncology | Admitting: Oncology

## 2018-01-30 ENCOUNTER — Other Ambulatory Visit: Payer: Self-pay | Admitting: *Deleted

## 2018-01-30 ENCOUNTER — Encounter: Payer: Self-pay | Admitting: Oncology

## 2018-01-30 ENCOUNTER — Other Ambulatory Visit: Payer: Self-pay

## 2018-01-30 ENCOUNTER — Other Ambulatory Visit: Payer: Self-pay | Admitting: General Surgery

## 2018-01-30 ENCOUNTER — Ambulatory Visit
Admission: RE | Admit: 2018-01-30 | Discharge: 2018-01-30 | Disposition: A | Discharge status: Home / Self Care | Payer: Medicare Other | Source: Ambulatory Visit | Attending: Radiation Oncology | Admitting: Radiation Oncology

## 2018-01-30 ENCOUNTER — Encounter: Payer: Self-pay | Admitting: Radiation Oncology

## 2018-01-30 ENCOUNTER — Encounter: Payer: Self-pay | Admitting: Physical Therapy

## 2018-01-30 ENCOUNTER — Inpatient Hospital Stay: Payer: Medicare Other

## 2018-01-30 VITALS — BP 129/77 | HR 87 | Temp 97.7°F | Resp 18 | Ht 68.0 in | Wt 164.8 lb

## 2018-01-30 DIAGNOSIS — Z8041 Family history of malignant neoplasm of ovary: Secondary | ICD-10-CM | POA: Diagnosis not present

## 2018-01-30 DIAGNOSIS — Z720 Tobacco use: Secondary | ICD-10-CM | POA: Diagnosis not present

## 2018-01-30 DIAGNOSIS — Z170 Estrogen receptor positive status [ER+]: Secondary | ICD-10-CM | POA: Diagnosis not present

## 2018-01-30 DIAGNOSIS — Z801 Family history of malignant neoplasm of trachea, bronchus and lung: Secondary | ICD-10-CM | POA: Diagnosis not present

## 2018-01-30 DIAGNOSIS — Z808 Family history of malignant neoplasm of other organs or systems: Secondary | ICD-10-CM | POA: Diagnosis not present

## 2018-01-30 DIAGNOSIS — C50312 Malignant neoplasm of lower-inner quadrant of left female breast: Secondary | ICD-10-CM

## 2018-01-30 DIAGNOSIS — Z9079 Acquired absence of other genital organ(s): Secondary | ICD-10-CM | POA: Diagnosis not present

## 2018-01-30 DIAGNOSIS — E890 Postprocedural hypothyroidism: Secondary | ICD-10-CM | POA: Diagnosis not present

## 2018-01-30 DIAGNOSIS — Z809 Family history of malignant neoplasm, unspecified: Secondary | ICD-10-CM

## 2018-01-30 DIAGNOSIS — R293 Abnormal posture: Secondary | ICD-10-CM | POA: Insufficient documentation

## 2018-01-30 DIAGNOSIS — Z90722 Acquired absence of ovaries, bilateral: Secondary | ICD-10-CM | POA: Diagnosis not present

## 2018-01-30 DIAGNOSIS — Z79890 Hormone replacement therapy (postmenopausal): Secondary | ICD-10-CM | POA: Diagnosis not present

## 2018-01-30 DIAGNOSIS — Z90710 Acquired absence of both cervix and uterus: Secondary | ICD-10-CM | POA: Diagnosis not present

## 2018-01-30 DIAGNOSIS — F17210 Nicotine dependence, cigarettes, uncomplicated: Secondary | ICD-10-CM | POA: Diagnosis not present

## 2018-01-30 LAB — CBC WITH DIFFERENTIAL (CANCER CENTER ONLY)
Abs Immature Granulocytes: 0.04 10*3/uL (ref 0.00–0.07)
BASOS ABS: 0.1 10*3/uL (ref 0.0–0.1)
BASOS PCT: 1 %
EOS ABS: 0.4 10*3/uL (ref 0.0–0.5)
Eosinophils Relative: 3 %
HCT: 42.1 % (ref 36.0–46.0)
Hemoglobin: 13.8 g/dL (ref 12.0–15.0)
IMMATURE GRANULOCYTES: 0 %
Lymphocytes Relative: 30 %
Lymphs Abs: 3.9 10*3/uL (ref 0.7–4.0)
MCH: 32.9 pg (ref 26.0–34.0)
MCHC: 32.8 g/dL (ref 30.0–36.0)
MCV: 100.5 fL — AB (ref 80.0–100.0)
Monocytes Absolute: 0.9 10*3/uL (ref 0.1–1.0)
Monocytes Relative: 7 %
NEUTROS ABS: 7.8 10*3/uL — AB (ref 1.7–7.7)
NEUTROS PCT: 59 %
PLATELETS: 333 10*3/uL (ref 150–400)
RBC: 4.19 MIL/uL (ref 3.87–5.11)
RDW: 13.2 % (ref 11.5–15.5)
WBC Count: 13.1 10*3/uL — ABNORMAL HIGH (ref 4.0–10.5)
nRBC: 0 % (ref 0.0–0.2)

## 2018-01-30 LAB — CMP (CANCER CENTER ONLY)
ALBUMIN: 3.9 g/dL (ref 3.5–5.0)
ALT: 17 U/L (ref 0–44)
AST: 13 U/L — ABNORMAL LOW (ref 15–41)
Alkaline Phosphatase: 89 U/L (ref 38–126)
Anion gap: 7 (ref 5–15)
BUN: 16 mg/dL (ref 8–23)
CHLORIDE: 106 mmol/L (ref 98–111)
CO2: 26 mmol/L (ref 22–32)
Calcium: 9.8 mg/dL (ref 8.9–10.3)
Creatinine: 0.95 mg/dL (ref 0.44–1.00)
GFR, Est AFR Am: >60 mL/min (ref >60)
GFR, Estimated: 59 mL/min — ABNORMAL LOW (ref >60)
GLUCOSE: 103 mg/dL — AB (ref 70–99)
POTASSIUM: 4.5 mmol/L (ref 3.5–5.1)
Sodium: 139 mmol/L (ref 135–145)
TOTAL PROTEIN: 7.5 g/dL (ref 6.5–8.1)

## 2018-01-30 NOTE — Progress Notes (Signed)
Radiation Oncology         [phone] ________________________________  Name: Margaret Hodges        MRN: [id number]  Date of Service: 01/30/2018 DOB: 01-22-1946  CC:[name], MD  Fanny Skates, MD     REFERRING PHYSICIAN: Fanny Skates, MD   DIAGNOSIS: The encounter diagnosis was Malignant neoplasm of lower-inner quadrant of left breast in female, estrogen receptor positive ([place]).   HISTORY OF PRESENT ILLNESS: Margaret Hodges is a 72 y.o. female seen in the multidisciplinary breast clinic for a new diagnosis of left breast cancer. The patient was noted to have a screening detected mass in the left breast.  This prompted additional diagnostic imaging which revealed a 13 mm mass at the 7 o'clock position in the left breast, her axilla was negative for adenopathy.  She underwent a biopsy on 01/16/2018 which revealed a grade 3 invasive ductal carcinoma, ER positive, PR negative, HER-2 amplified with a Ki-67 of 80%.  She comes today to discuss options of treatment for her cancer.    PREVIOUS RADIATION THERAPY: No   PAST MEDICAL HISTORY:  Past Medical History:  Diagnosis Date  . Anginal pain ([place])    with admission to hospital due to high blood pressure  . GERD (gastroesophageal reflux disease)   . Guillain Barr syndrome ([place]) 1959  . Guillain Barr syndrome ([place]) 1959  . Guillain Barr syndrome ([place]) 1959  . Headache    sinus headaches  . History of hiatal hernia    was told pt. had one, but no testing  . Hypertensive urgency 04/05/2014  . Malignant neoplasm of lower-inner quadrant of left breast in female, estrogen receptor positive ([place]) 01/24/2018  . Neuromuscular disorder ([place])    Guillian- Barre Syndrome  . Pneumonia    hx. of walking pneumonia       PAST SURGICAL HISTORY: Past Surgical History:  Procedure Laterality Date  . ABDOMINAL HYSTERECTOMY    . Bladder lift  15 years ago,  Uterus prolapse causing intestinal damage and surgery    . Right torn  meniscus repair x 2    . THYROID LOBECTOMY N/A 02/04/2015   Procedure: TOTAL THYROIDECTOMY;  Surgeon: Armandina Gemma, MD;  Location: [facility];  Service: General;  Laterality: N/A;  . TONSILLECTOMY    . TUMOR REMOVAL    . Tumors on arms removed,  Tumor on gum removed,  Tumor removed from inside ear       FAMILY HISTORY:  Family History  Problem Relation Age of Onset  . Ovarian cancer Sister      SOCIAL HISTORY:  reports that she has been smoking cigarettes. She has been smoking about 0.50 packs per day. She has never used smokeless tobacco. She reports that she does not drink alcohol or use drugs.  The patient is married and lives in [place]. She is accompanied by her daughter. 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    ALLERGIES: Allegra [fexofenadine]; Clindamycin/lincomycin; Influenza a (h1n1) monoval vac; Keflex [cephalexin]; Penicillins; Prednisone; Protonix [pantoprazole sodium]; and Statins   MEDICATIONS:  Current Outpatient Medications  Medication Sig Dispense Refill  . amLODipine (NORVASC) 5 MG tablet Take 1 tablet (5 mg total) by mouth daily. 30 tablet 0  . calcium-vitamin D (OSCAL WITH D) 500-200 MG-UNIT tablet Take 1 tablet by mouth 2 (two) times daily. 60 tablet 0  . Cholecalciferol (VITAMIN D) 2000 UNITS tablet Take 2,000 Units by mouth daily.    . diclofenac sodium (VOLTAREN) 1 %  GEL Apply 4 g topically 4 (four) times daily as needed (arthritis pain).     Marland Kitchen doxycycline (DORYX) 100 MG EC tablet Take 100 mg by mouth 2 (two) times daily. Completed course 10/28    . estradiol (ESTRACE) 0.1 MG/GM vaginal cream Place 1 Applicatorful vaginally once a week. Fridays    . HYDROcodone-acetaminophen (NORCO/VICODIN) 5-325 MG per tablet Take 1 tablet by mouth every 6 (six) hours as needed for moderate pain.    . hyoscyamine (LEVBID) 0.375 MG 12 hr tablet Take 0.375 mg by mouth every 12 (twelve) hours as needed (blaader spasms).    Marland Kitchen levothyroxine (SYNTHROID) 88 MCG  tablet Take 1 tablet (88 mcg total) by mouth daily before breakfast. 30 tablet 3  . omeprazole (PRILOSEC) 20 MG capsule Take 20 mg by mouth daily.    Marland Kitchen Phenylephrine-Bromphen-DM (PHENYLEPHRINE COMPLEX PO) Take 5 mg by mouth as directed. Takes every morning and may takes up to 2 additional tablets if needed for sinus congestion during the day    . ranitidine (ZANTAC) 150 MG tablet Take 150 mg by mouth at bedtime as needed for heartburn.    . saccharomyces boulardii (FLORASTOR) 250 MG capsule Take 250 mg by mouth daily as needed (only when on antibiotic).     No current facility-administered medications for this visit.      REVIEW OF SYSTEMS: On review of systems, the patient reports that she is doing well overall. She denies any chest pain, shortness of breath, cough, fevers, chills, night sweats, unintended weight changes. She denies any bowel or bladder disturbances, and denies abdominal pain, nausea or vomiting. She denies any new musculoskeletal or joint aches or pains. A complete review of systems is obtained and is otherwise negative.     PHYSICAL EXAM:  Wt Readings from Last 3 Encounters:  05/08/17 171 lb (77.6 kg)  02/04/15 165 lb (74.8 kg)  01/28/15 165 lb (74.8 kg)   Temp Readings from Last 3 Encounters:  05/08/17 97.7 F (36.5 C) (Oral)  02/05/15 98.2 F (36.8 C) (Oral)  01/28/15 98.2 F (36.8 C) (Oral)   BP Readings from Last 3 Encounters:  05/08/17 (!) 146/85  02/05/15 128/77  01/28/15 (!) 144/78   Pulse Readings from Last 3 Encounters:  05/08/17 93  02/05/15 (!) 4  01/28/15 77     In general this is a well appearing Caucasian female in no acute distress. She is alert and oriented x4 and appropriate throughout the examination. HEENT reveals that the patient is normocephalic, atraumatic. EOMs are intact. PERRLA. Skin is intact without any evidence of gross lesions. Cardiovascular exam reveals a regular rate and rhythm, no clicks rubs or murmurs are auscultated.  Chest is clear to auscultation bilaterally. Lymphatic assessment is performed and does not reveal any adenopathy in the cervical, supraclavicular, axillary, or inguinal chains. Bilateral breast exam is performed and reveals fullness deep to the left breast biopsy site. no mass is noted in the right breast. No skin change, nipple bleeding, or discharge is noted of either breast. Abdomen has active bowel sounds in all quadrants and is intact. The abdomen is soft, non tender, non distended. Lower extremities are negative for pretibial pitting edema, deep calf tenderness, cyanosis or clubbing.   ECOG = 0  0 - Asymptomatic (Fully active, able to carry on all predisease activities without restriction)  1 - Symptomatic but completely ambulatory (Restricted in physically strenuous activity but ambulatory and able to carry out work of a light or sedentary nature. For example,  light housework, office work)  2 - Symptomatic, <50% in bed during the day (Ambulatory and capable of all self care but unable to carry out any work activities. Up and about more than 50% of waking hours)  3 - Symptomatic, >50% in bed, but not bedbound (Capable of only limited self-care, confined to bed or chair 50% or more of waking hours)  4 - Bedbound (Completely disabled. Cannot carry on any self-care. Totally confined to bed or chair)  5 - Death   Eustace Pen MM, Creech RH, Tormey DC, et al. [phone]). "Toxicity and response criteria of the [facility]". [place] Oncol. 5 (6): 649-55    LABORATORY DATA:  Lab Results  Component Value Date   WBC 12.5 (H) 05/08/2017   HGB 14.9 05/08/2017   HCT 43.3 05/08/2017   HCT 44.0 05/08/2017   MCV 100.5 05/08/2017   PLT 308 05/08/2017   Lab Results  Component Value Date   NA 139 05/08/2017   K 4.5 05/08/2017   CL 106 05/08/2017   CO2 24 05/08/2017   Lab Results  Component Value Date   ALT 25 05/08/2017   AST 18 05/08/2017   ALKPHOS 99 05/08/2017    BILITOT 0.5 05/08/2017      RADIOGRAPHY: No results found.     IMPRESSION/PLAN: 1. Stage IA, cT1cN0M0, grade 3 ER positive, HER2 amplified invasive ductal carcinoma of the left breast. Dr. Lisbeth Renshaw discusses the pathology findings and reviews the nature of left breast disease. The consensus from the breast conference includes breast conservation with lumpectomy with sentinel node biopsy.  She is a candidate for adjuvant chemotherapy with HER-2 targeted blockade as well.  Following this she would also benefit from adjuvant radiotherapy to the breast followed by antiestrogen therapy. We discussed the risks, benefits, short, and long term effects of radiotherapy, and the patient is interested in proceeding. Dr. Lisbeth Renshaw discusses the delivery and logistics of radiotherapy and anticipates a course of 4 or 6 1/2 weeks of radiotherapy with deep inspiration breath-hold technique. We will see her back about 2 weeks after surgery to discuss the simulation process and anticipate we starting radiotherapy about 4-6 weeks after surgery.  2. Possible genetic predisposition to malignancy. The patient is a candidate for genetic testing given her personal and family history. She was offered referral and is interested in meeting and will be scheduled for this in the near future. 3. HRT. The patient will discontinue her estrogen therapy and will be   a candidate for antiestrogen therapy in the future.  The above documentation reflects my direct findings during this shared patient visit. Please see the separate note by Dr. Lisbeth Renshaw on this date for the remainder of the patient's plan of care.    Carola Rhine, PAC

## 2018-01-30 NOTE — Therapy (Signed)
[place], [place], [address] Phone: [phone]   Fax:  [phone]  Physical Therapy Evaluation  Patient Details  Name: Margaret Hodges MRN: [id number] Date of Birth: 1945/05/02 Referring Provider (PT): Dr. Fanny Skates   Encounter Date: 01/30/2018  PT End of Session - 01/30/18 1522    Visit Number  1    Number of Visits  2    Date for PT Re-Evaluation  03/27/18    PT Start Time  8416    PT Stop Time  1408    PT Time Calculation (min)  21 min    Activity Tolerance  Patient tolerated treatment well       Past Medical History:  Diagnosis Date  . Anginal pain ([place])    with admission to hospital due to high blood pressure  . GERD (gastroesophageal reflux disease)   . Guillain Barr syndrome ([place]) 1959  . Guillain Barr syndrome ([place]) 1959  . Guillain Barr syndrome ([place]) 1959  . Headache    sinus headaches  . History of hiatal hernia    was told pt. had one, but no testing  . Hypertensive urgency 04/05/2014  . Malignant neoplasm of lower-inner quadrant of left breast in female, estrogen receptor positive ([place]) 01/24/2018  . Neuromuscular disorder ([place])    Guillian- Barre Syndrome  . Pneumonia    hx. of walking pneumonia    Past Surgical History:  Procedure Laterality Date  . ABDOMINAL HYSTERECTOMY     Total  . Bladder lift  15 years ago,  Uterus prolapse causing intestinal damage and surgery    . Right torn meniscus repair x 2    . THYROID LOBECTOMY N/A 02/04/2015   Procedure: TOTAL THYROIDECTOMY;  Surgeon: Armandina Gemma, MD;  Location: [facility];  Service: General;  Laterality: N/A;  . TONSILLECTOMY    . TUMOR REMOVAL    . Tumors on arms removed,  Tumor on gum removed,  Tumor removed from inside ear      There were no vitals filed for this visit.   Subjective Assessment - 01/30/18 1513    Subjective  Patient reports she is here today to be seen by her medical team for her newly  diagnosed left breast cancer.    Patient is accompained by:  Family member    Pertinent History  Patient was diagnosed on 01/03/18 with left grade III invasive ductal carcinoma. It measures 1.4 cm and is located in the lower inner quadrant. It is ER positive, PR negative, and HER2 positive.     Patient Stated Goals  Reduce lymphedema risk and learn post op shoulder ROM HEP    Currently in Pain?  Yes    Pain Score  2     Pain Location  Back    Pain Orientation  Lower    Pain Descriptors / Indicators  Aching    Pain Type  Chronic pain    Pain Onset  More than a month ago    Pain Frequency  Intermittent    Aggravating Factors   Pain increases with activity    Pain Relieving Factors  Rest         [facility] PT Assessment - 01/30/18 0001      Assessment   Medical Diagnosis  Left breast cancer    Referring Provider (PT)  Dr. Fanny Skates    Onset Date/Surgical Date  01/03/18    Hand Dominance  Right    Prior Therapy  none  Precautions   Precautions  Other (comment)    Precaution Comments  active cancer      Restrictions   Weight Bearing Restrictions  No      Balance Screen   Has the patient fallen in the past 6 months  No    Has the patient had a decrease in activity level because of a fear of falling?   No    Is the patient reluctant to leave their home because of a fear of falling?   No      Home Social worker  Private residence    Living Arrangements  Spouse/significant other   Husband is terminally ill   Available Help at Discharge  Family      Prior Function   Level of [place]  Retired    Leisure  She goes to [employer] and [employer]   Overall Cognitive Status  Within Functional Limits for tasks assessed      Posture/Postural Control   Posture/Postural Control  Postural limitations    Postural Limitations  Rounded Shoulders;Forward head      ROM / Strength   AROM / PROM / Strength  AROM;Strength       AROM   AROM Assessment Site  Shoulder;Cervical    Right/Left Shoulder  Right;Left    Right Shoulder Extension  47 Degrees    Right Shoulder Flexion  153 Degrees    Right Shoulder ABduction  165 Degrees    Right Shoulder Internal Rotation  60 Degrees    Right Shoulder External Rotation  90 Degrees    Left Shoulder Extension  48 Degrees    Left Shoulder Flexion  155 Degrees    Left Shoulder ABduction  180 Degrees    Left Shoulder Internal Rotation  59 Degrees    Left Shoulder External Rotation  83 Degrees    Cervical Flexion  WNL    Cervical Extension  WNL    Cervical - Right Side Bend  25% limited    Cervical - Left Side Bend  25% limited    Cervical - Right Rotation  25% limited    Cervical - Left Rotation  25% limited      Strength   Overall Strength  Within functional limits for tasks performed        LYMPHEDEMA/ONCOLOGY QUESTIONNAIRE - 01/30/18 1521      Type   Cancer Type  Left breast cancer      Lymphedema Assessments   Lymphedema Assessments  Upper extremities      Right Upper Extremity Lymphedema   10 cm Proximal to Olecranon Process  26.7 cm    Olecranon Process  24.8 cm    10 cm Proximal to Ulnar Styloid Process  22.7 cm    Just Proximal to Ulnar Styloid Process  15.8 cm    Across Hand at [employer]  19.5 cm    At [place] of 2nd Digit  6.1 cm      Left Upper Extremity Lymphedema   10 cm Proximal to Olecranon Process  26.5 cm    Olecranon Process  24.4 cm    10 cm Proximal to Ulnar Styloid Process  20.6 cm    Just Proximal to Ulnar Styloid Process  15.2 cm    Across Hand at [employer]  18.7 cm    At [place] of 2nd Digit  6 cm  Katina Dung - 01/30/18 0001    Open a tight or new jar  Mild difficulty    Do heavy household chores (wash walls, wash floors)  No difficulty    Carry a shopping bag or briefcase  No difficulty    Wash your back  No difficulty    Use a knife to cut food  No difficulty    Recreational activities in which you take  some force or impact through your arm, shoulder, or hand (golf, hammering, tennis)  No difficulty    During the past week, to what extent has your arm, shoulder or hand problem interfered with your normal social activities with family, friends, neighbors, or groups?  Slightly    During the past week, to what extent has your arm, shoulder or hand problem limited your work or other regular daily activities  Not at all    Arm, shoulder, or hand pain.  None    Tingling (pins and needles) in your arm, shoulder, or hand  Mild    Difficulty Sleeping  Mild difficulty    DASH Score  9.09 %        Objective measurements completed on examination: See above findings.      Patient was instructed today in a home exercise program today for post op shoulder range of motion. These included active assist shoulder flexion in sitting, scapular retraction, wall walking with shoulder abduction, and hands behind head external rotation.  She was encouraged to do these twice a day, holding 3 seconds and repeating 5 times when permitted by her physician.     PT Education - 01/30/18 1522    Education Details  Lymphedema risk reduction and post op shoulder ROM HEP    Person(s) Educated  Patient;Child(ren)    Methods  Explanation;Demonstration;Handout    Comprehension  Verbalized understanding;Returned demonstration          PT Long Term Goals - 01/30/18 1527      PT LONG TERM GOAL #1   Title  Patient will demonstrate she has regained shoulder function and ROM post operatively compared to baseline.    Time  8    Period  Weeks    Status  New      Breast Clinic Goals - 01/30/18 1527      Patient will be able to verbalize understanding of pertinent lymphedema risk reduction practices relevant to her diagnosis specifically related to skin care.   Time  1    Period  Days    Status  Achieved      Patient will be able to return demonstrate and/or verbalize understanding of the post-op home exercise  program related to regaining shoulder range of motion.   Time  1    Status  Achieved      Patient will be able to verbalize understanding of the importance of attending the postoperative After Breast Cancer Class for further lymphedema risk reduction education and therapeutic exercise.   Time  1    Period  Days    Status  Achieved            Plan - 01/30/18 1523    Clinical Impression Statement  Patient was diagnosed on 01/03/18 with left grade III invasive ductal carcinoma. It measures 1.4 cm and is located in the lower inner quadrant. It is ER positive, PR negative, and HER2 positive. Her multidisciplinary medical team met prior to her assessments to determine a recommended treatment plan. She is planning to have a left  lumpectomy and sentinel node biopsy followed by chemo, radiation, and anti-estrogen therapy. She will benefit from a post op PT visit to reassess and determine needs.    Clinical Presentation  Stable    Clinical Decision Making  Low    Rehab Potential  Excellent    Clinical Impairments Affecting Rehab Potential  None    PT Frequency  --   Eval and 1 f/u visit   PT Treatment/Interventions  ADLs/Self Care Home Management;Therapeutic exercise;Patient/family education    PT Next Visit Plan  Will reassess 3-4 weeks post op to determine needs    PT Home Exercise Plan  Post op shoulder ROM HEP    Consulted and Agree with Plan of Care  Patient;Family member/caregiver    Family Member Consulted  Husband       Patient will benefit from skilled therapeutic intervention in order to improve the following deficits and impairments:  Postural dysfunction, Decreased range of motion, Impaired UE functional use, Pain, Decreased knowledge of precautions  Visit Diagnosis: Malignant neoplasm of lower-inner quadrant of left breast in female, estrogen receptor positive ([place]) - Plan: PT plan of care cert/re-cert  Abnormal posture - Plan: PT plan of care cert/re-cert   Patient will  follow up at outpatient cancer rehab 3-4 weeks following surgery.  If the patient requires physical therapy at that time, a specific plan will be dictated and sent to the referring physician for approval. The patient was educated today on appropriate basic range of motion exercises to begin post operatively and the importance of attending the After Breast Cancer class following surgery.  Patient was educated today on lymphedema risk reduction practices as it pertains to recommendations that will benefit the patient immediately following surgery.  She verbalized good understanding.      Problem List Patient Active Problem List   Diagnosis Date Noted  . Malignant neoplasm of lower-inner quadrant of left breast in female, estrogen receptor positive ([place]) 01/24/2018  . Hyperthyroidism, subclinical 02/03/2015  . Multiple thyroid nodules 02/03/2015  . Hypertensive urgency 04/05/2014  . Chest pain 04/05/2014  . Dyslipidemia 04/05/2014  . Tobacco abuse 04/05/2014    Annia Friendly, PT 01/30/18 3:31 PM   [place], [place], [address] Phone: [phone]   Fax:  [phone]  Name: Margaret Hodges MRN: [id number] Date of Birth: 1946-02-05

## 2018-01-30 NOTE — Patient Instructions (Signed)

## 2018-01-31 ENCOUNTER — Telehealth: Payer: Self-pay | Admitting: Oncology

## 2018-01-31 NOTE — Telephone Encounter (Signed)
Made infusion appts per 11/6 los.

## 2018-02-01 ENCOUNTER — Telehealth: Payer: Self-pay | Admitting: *Deleted

## 2018-02-01 NOTE — Progress Notes (Unsigned)
[place] Psychosocial Distress Screening Spiritual Care  Met with Margaret Hodges in [facility] to introduce [place] team/resources, reviewing distress screen per protocol.  The patient scored a 6 on the Psychosocial Distress Thermometer which indicates moderate distress. Also assessed for distress and other psychosocial needs.   ONCBCN DISTRESS SCREENING 02/01/2018  Screening Type Initial Screening  Distress experienced in past week (1-10) 6  Family Problem type Partner;Other (comment)  Emotional problem type Nervousness/Anxiety  Physical Problem type Pain;Changes in urination  Referral to support programs Yes   The patient received a resource packet due to having to leave Breast Clinic prior to seeing a member of Patient and Family Support. Upon phone follow-up with the patient, the patient expressed that she feels hopeful and encouraged by having more information about her diagnosis and treatment. The patient reported that her primary concern is for her daughter, who has been having difficulty adjusting to the patient's diagnosis, per the patient. The patient expressed that she appreciated a call and will reach out to the Patient and [facility] if any needs arise.  Follow up needed: No.  Doris Cheadle, Counseling Intern [phone]

## 2018-02-01 NOTE — Telephone Encounter (Signed)
Informed pt of echo appt date and time on 10/13.

## 2018-02-03 MED ORDER — PROCHLORPERAZINE MALEATE 10 MG PO TABS: 10.0000 mg | ORAL_TABLET | Freq: Four times a day (QID) | ORAL | 1 refills | Status: DC | PRN

## 2018-02-03 MED ORDER — LIDOCAINE-PRILOCAINE 2.5-2.5 % EX CREA: TOPICAL_CREAM | CUTANEOUS | 3 refills | Status: DC

## 2018-02-03 NOTE — Progress Notes (Signed)
START OFF PATHWAY REGIMEN - Breast   OFF00010:Paclitaxel 80 mg/m2 Weekly:   Administer weekly:     Paclitaxel   **Always confirm dose/schedule in your pharmacy ordering system**  Patient Characteristics: Preoperative or Nonsurgical Candidate (Clinical Staging), Neoadjuvant Therapy followed by Surgery, Invasive Disease, Chemotherapy, HER2 Positive, ER Positive Therapeutic Status: Preoperative or Nonsurgical Candidate (Clinical Staging) AJCC M Category: cM0 AJCC Grade: G3 Breast Surgical Plan: Neoadjuvant Therapy followed by Surgery ER Status: Positive (+) AJCC 8 Stage Grouping: IA HER2 Status: Positive (+) AJCC T Category: cT1c AJCC N Category: cN0 PR Status: Positive (+) Intent of Therapy: Curative Intent, Discussed with Patient

## 2018-02-06 ENCOUNTER — Telehealth: Payer: Self-pay | Admitting: *Deleted

## 2018-02-06 ENCOUNTER — Other Ambulatory Visit: Payer: Self-pay | Admitting: *Deleted

## 2018-02-06 ENCOUNTER — Ambulatory Visit (HOSPITAL_COMMUNITY)
Admission: RE | Admit: 2018-02-06 | Discharge: 2018-02-06 | Disposition: A | Discharge status: Home / Self Care | Payer: Medicare Other | Source: Ambulatory Visit | Attending: Oncology | Admitting: Oncology

## 2018-02-06 DIAGNOSIS — C50312 Malignant neoplasm of lower-inner quadrant of left female breast: Secondary | ICD-10-CM | POA: Diagnosis not present

## 2018-02-06 DIAGNOSIS — Z01810 Encounter for preprocedural cardiovascular examination: Secondary | ICD-10-CM | POA: Diagnosis not present

## 2018-02-06 DIAGNOSIS — Z170 Estrogen receptor positive status [ER+]: Secondary | ICD-10-CM | POA: Diagnosis not present

## 2018-02-06 DIAGNOSIS — I10 Essential (primary) hypertension: Secondary | ICD-10-CM | POA: Insufficient documentation

## 2018-02-06 DIAGNOSIS — E119 Type 2 diabetes mellitus without complications: Secondary | ICD-10-CM | POA: Diagnosis not present

## 2018-02-06 NOTE — Progress Notes (Signed)
  Echocardiogram 2D Echocardiogram has been performed.  Jennette Dubin 02/06/2018, 10:34 AM

## 2018-02-06 NOTE — Telephone Encounter (Signed)
Spoke to pt concerning [place] from 01/30/18. Denies questions or concerns regarding dx or treatment care plan. Encourage pt to call with needs. Received verbal understanding. Contact information provided.

## 2018-02-08 ENCOUNTER — Telehealth: Payer: Self-pay | Admitting: Oncology

## 2018-02-08 ENCOUNTER — Telehealth: Payer: Self-pay | Admitting: Adult Health

## 2018-02-08 NOTE — Telephone Encounter (Signed)
Checked with [place] regarding patient's appts.  Added lab/FLUSH to all appts.

## 2018-02-08 NOTE — Telephone Encounter (Signed)
Per 11/10, 11/14, and 11/15 sch msgs.  Changed schedule and mailed calendar to patient.

## 2018-02-11 ENCOUNTER — Encounter (HOSPITAL_BASED_OUTPATIENT_CLINIC_OR_DEPARTMENT_OTHER): Payer: Self-pay | Admitting: *Deleted

## 2018-02-11 ENCOUNTER — Other Ambulatory Visit: Payer: Self-pay

## 2018-02-12 ENCOUNTER — Encounter (HOSPITAL_BASED_OUTPATIENT_CLINIC_OR_DEPARTMENT_OTHER)
Admission: RE | Admit: 2018-02-12 | Discharge: 2018-02-12 | Disposition: A | Discharge status: Home / Self Care | Payer: Medicare Other | Source: Ambulatory Visit | Attending: General Surgery | Admitting: General Surgery

## 2018-02-12 DIAGNOSIS — Z01810 Encounter for preprocedural cardiovascular examination: Secondary | ICD-10-CM | POA: Diagnosis not present

## 2018-02-12 NOTE — Progress Notes (Signed)
Ensure presurgery drink given with instructions to complete by 0700 dos, surgical soap given with instructions, pt verbalized understanding. 

## 2018-02-14 DIAGNOSIS — C50312 Malignant neoplasm of lower-inner quadrant of left female breast: Secondary | ICD-10-CM | POA: Diagnosis not present

## 2018-02-16 NOTE — H&P (Signed)
Margaret Hodges Location: [facility] Patient #: [id number] DOB: 03-24-1946 Married / Language: English / Race: White Female       History of Present Illness        This is a 72 year old female seen in the [name] . She was referred by Margaret Hodges at [facility] mammography and imaging. She is seen by Dr. Lisbeth Renshaw, Dr. Jana Hakim, and me. Margaret Hodges is her PCP. Her daughter is with her throughout the entirety of the encounter today      She has no prior history of breast problems. Recent screening mammogram showed a 1.4 cm mass in the left breast, lower inner quadrant, 7 o'clock position, posterior depth, 10 cm from the nipple.  Image guided biopsy shows high-grade, grade 3 invasive ductal carcinoma. HER-2 positive. ER 60%. PR negative. Ki-67 80%. She has been told this is a high-grade tumor and chemotherapy is advised. She agrees with chemotherapy and Port-A-Cath incision and definitive breast surgery.      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 Total thyroidectomy for benign disease by Dr. Harlow Asa. TAH, BSO, appendectomy. Family history reveals sister died of ovarian cancer age 59. Otherwise no cancer syndromes Social history reveals that she is married and cares for her husband who has about some dementia. Smokes 1 pack of cigarettes per day. Denies alcohol. Has 2 children.      She takes Estrace and was told to discontinue that now which she states she will.      We had a one-hour discussion. We went over indications techniques and risks of Port-A-Cath insertion. We discussed the options of lumpectomy, sentinel node biopsy radiation therapy and compare that to mastectomy with or without reconstruction. She is interested in breast conservation. I told her she was a good candidate for that. She will be scheduled for Port-A-Cath insertion, injection blue dye, left breast lumpectomy with radioactive seed  localization, left axillary deep Sentinel lymph node mapping and biopsy I discussed the indications, techniques, and risk all these procedures in detail. She is in full agreement  Dr. Jana Hakim plans to refer her to genetics.   Past Surgical History Anal Fissure Repair  Appendectomy  Breast Biopsy  Left. Cataract Surgery  Bilateral. Hysterectomy (not due to cancer) - Complete  Knee Surgery  Right. Oral Surgery  Thyroid Surgery  Tonsillectomy   Diagnostic Studies History Colonoscopy  1-5 years ago Mammogram  within last year  Medication History Medications Reconciled  Social History  Caffeine use  Carbonated beverages, Coffee. No alcohol use  No drug use  Tobacco use  Current every day smoker.  Family History  Arthritis  Father, Mother, Sister. Cancer  Brother. Cerebrovascular Accident  Mother. Cervical Cancer  Sister. Colon Polyps  Mother. Diabetes Mellitus  Mother. Heart Disease  Mother, Sister. Heart disease in female family member before age 22  Hypertension  Mother. Ischemic Bowel Disease  Sister. Ovarian Cancer  Sister.  Pregnancy / Birth History  Gravida  2 Maternal age  8-25 Para  2  Other Problems  Arthritis  Back Pain  Bladder Problems  Gastroesophageal Reflux Disease  General anesthesia - complications  Hemorrhoids  High blood pressure  Hypercholesterolemia  Oophorectomy  Bilateral. Other disease, cancer, significant illness  Thyroid Disease     Review of Systems  General Present- Fatigue and Weight Loss. Not Present- Appetite Loss, Chills, Fever, Night Sweats and Weight Gain. Skin Not Present- Change in Wart/Mole, Dryness, Hives, Jaundice, New Lesions, Non-Healing Wounds, Rash  and Ulcer. HEENT Present- Hearing Loss, Seasonal Allergies and Sinus Pain. Not Present- Earache, Hoarseness, Nose Bleed, Oral Ulcers, Ringing in the Ears, Sore Throat, Visual Disturbances, Wears glasses/contact lenses and  Yellow Eyes. Respiratory Not Present- Bloody sputum, Chronic Cough, Difficulty Breathing, Snoring and Wheezing. Breast Present- Breast Mass. Not Present- Breast Pain, Nipple Discharge and Skin Changes. Cardiovascular Not Present- Chest Pain, Difficulty Breathing Lying Down, Leg Cramps, Palpitations, Rapid Heart Rate, Shortness of Breath and Swelling of Extremities. Gastrointestinal Present- Hemorrhoids and Indigestion. Not Present- Abdominal Pain, Bloating, Bloody Stool, Change in Bowel Habits, Chronic diarrhea, Constipation, Difficulty Swallowing, Excessive gas, Gets full quickly at meals, Nausea, Rectal Pain and Vomiting. Female Genitourinary Present- Frequency and Nocturia. Not Present- Painful Urination, Pelvic Pain and Urgency. Musculoskeletal Present- Back Pain and Joint Pain. Not Present- Joint Stiffness, Muscle Pain, Muscle Weakness and Swelling of Extremities. Neurological Present- Headaches. Not Present- Decreased Memory, Fainting, Numbness, Seizures, Tingling, Tremor, Trouble walking and Weakness. Psychiatric Present- Anxiety and Depression. Not Present- Bipolar, Change in Sleep Pattern, Fearful and Frequent crying. Endocrine Present- Hot flashes. Not Present- Cold Intolerance, Excessive Hunger, Hair Changes, Heat Intolerance and New Diabetes. Hematology Not Present- Blood Thinners, Easy Bruising, Excessive bleeding, Gland problems, HIV and Persistent Infections.   Physical Exam  General Mental Status-Alert. General Appearance-Consistent with stated age. Hydration-Well hydrated. Voice-Normal.  Head and Neck Head-normocephalic, atraumatic with no lesions or palpable masses. Trachea-midline. Thyroid Gland Characteristics - normal size and consistency. Note: Well-healed thyroidectomy scar   Eye Eyeball - Bilateral-Extraocular movements intact. Sclera/Conjunctiva - Bilateral-No scleral icterus.  Chest and Lung Exam Chest and lung exam reveals -quiet, even  and easy respiratory effort with no use of accessory muscles and on auscultation, normal breath sounds, no adventitious sounds and normal vocal resonance. Inspection Chest Wall - Normal. Back - normal.  Breast Note: Breasts for medium sized. There may be a tiny palpable hematoma in the left breast lower inner quadrant. I cannot be sure. As no other skin change or masses in either breast. There is no palpable adenopathy.   Cardiovascular Cardiovascular examination reveals -normal heart sounds, regular rate and rhythm with no murmurs and normal pedal pulses bilaterally.  Abdomen Inspection Inspection of the abdomen reveals - No Hernias. Skin - Scar - Note: Well-healed Pfannenstiel incision. Palpation/Percussion Palpation and Percussion of the abdomen reveal - Soft, Non Tender, No Rebound tenderness, No Rigidity (guarding) and No hepatosplenomegaly. Auscultation Auscultation of the abdomen reveals - Bowel sounds normal.  Neurologic Neurologic evaluation reveals -alert and oriented x 3 with no impairment of recent or remote memory. Mental Status-Normal.  Musculoskeletal Normal Exam - Left-Upper Extremity Strength Normal and Lower Extremity Strength Normal. Normal Exam - Right-Upper Extremity Strength Normal and Lower Extremity Strength Normal.  Lymphatic Head & Neck  General Head & Neck Lymphatics: Bilateral - Description - Normal. Axillary  General Axillary Region: Bilateral - Description - Normal. Tenderness - Non Tender. Femoral & Inguinal  Generalized Femoral & Inguinal Lymphatics: Bilateral - Description - Normal. Tenderness - Non Tender.    Assessment & Plan  PRIMARY CANCER OF LOWER-INNER QUADRANT OF LEFT FEMALE BREAST (C50.312)    Your recent screening mammograms show a 1.4 cm mass in the left breast, lower inner quadrant, 10 cm from the nipple Ultrasound of the left axilla shows the lymph nodes to look normal Image guided biopsy of the left breast mass  shows a grade 3, high-grade invasive ductal carcinoma. This tumor is positive for HER-2/neu. Positive for estrogen receptors. Negative for progesterone receptors This is  a high-grade tumor You have been told that you will need chemotherapy and you accepted this.      Discontinue all estrogen and progesterone-containing compounds now you will be referred for genetic counseling      We have discussed surgical options including lumpectomy, sentinel node biopsy, radiation therapy. We have compared that to mastectomy. you state that you are inserted in lumpectomy and I think you're a good candidate for that       you will be scheduled for Port-A-Cath insertion, left breast lumpectomy with radioactive seed localization, left axillary sentinel lymph node biopsy. We have discussed the indications, techniques, and risks of all of the surgical procedures you stated you are ready to proceed with scheduling   HISTORY OF THYROIDECTOMY, TOTAL (E89.0) Impression: Benign disease HISTORY OF TOTAL ABDOMINAL HYSTERECTOMY AND BILATERAL SALPINGO-OOPHORECTOMY (Z90.710) FAMILY HISTORY OF OVARIAN CANCER (Z80.41) Impression: Sister. Died age 60 CONTINUOUS TOBACCO ABUSE (Z72.0) Impression: Reports 1 pack per day smoking    Jager Koska M. Dalbert Batman, M.D., [facility] General and Minimally invasive Surgery Breast and Colorectal Surgery Office:   [phone] Pager:   [phone]

## 2018-02-18 ENCOUNTER — Ambulatory Visit (HOSPITAL_COMMUNITY)
Admission: RE | Admit: 2018-02-18 | Discharge: 2018-02-18 | Disposition: A | Discharge status: Home / Self Care | Payer: Medicare Other | Source: Ambulatory Visit | Attending: General Surgery | Admitting: General Surgery

## 2018-02-18 ENCOUNTER — Encounter (HOSPITAL_BASED_OUTPATIENT_CLINIC_OR_DEPARTMENT_OTHER): Payer: Self-pay | Admitting: Anesthesiology

## 2018-02-18 ENCOUNTER — Ambulatory Visit (HOSPITAL_BASED_OUTPATIENT_CLINIC_OR_DEPARTMENT_OTHER)
Admission: RE | Admit: 2018-02-18 | Discharge: 2018-02-18 | Disposition: A | Discharge status: Home / Self Care | Payer: Medicare Other | Source: Ambulatory Visit | Attending: General Surgery | Admitting: General Surgery

## 2018-02-18 ENCOUNTER — Ambulatory Visit (HOSPITAL_COMMUNITY): Payer: Medicare Other

## 2018-02-18 ENCOUNTER — Other Ambulatory Visit: Payer: Self-pay

## 2018-02-18 ENCOUNTER — Ambulatory Visit (HOSPITAL_BASED_OUTPATIENT_CLINIC_OR_DEPARTMENT_OTHER): Payer: Medicare Other | Admitting: Anesthesiology

## 2018-02-18 ENCOUNTER — Encounter (HOSPITAL_BASED_OUTPATIENT_CLINIC_OR_DEPARTMENT_OTHER)
Admission: RE | Disposition: A | Discharge status: Home / Self Care | Payer: Self-pay | Source: Ambulatory Visit | Attending: General Surgery

## 2018-02-18 DIAGNOSIS — Z4682 Encounter for fitting and adjustment of non-vascular catheter: Secondary | ICD-10-CM | POA: Diagnosis not present

## 2018-02-18 DIAGNOSIS — M1990 Unspecified osteoarthritis, unspecified site: Secondary | ICD-10-CM | POA: Diagnosis not present

## 2018-02-18 DIAGNOSIS — F17210 Nicotine dependence, cigarettes, uncomplicated: Secondary | ICD-10-CM | POA: Insufficient documentation

## 2018-02-18 DIAGNOSIS — Z8041 Family history of malignant neoplasm of ovary: Secondary | ICD-10-CM | POA: Diagnosis not present

## 2018-02-18 DIAGNOSIS — C50312 Malignant neoplasm of lower-inner quadrant of left female breast: Secondary | ICD-10-CM

## 2018-02-18 DIAGNOSIS — I10 Essential (primary) hypertension: Secondary | ICD-10-CM | POA: Diagnosis not present

## 2018-02-18 DIAGNOSIS — E890 Postprocedural hypothyroidism: Secondary | ICD-10-CM | POA: Insufficient documentation

## 2018-02-18 DIAGNOSIS — E785 Hyperlipidemia, unspecified: Secondary | ICD-10-CM | POA: Diagnosis not present

## 2018-02-18 DIAGNOSIS — M549 Dorsalgia, unspecified: Secondary | ICD-10-CM | POA: Insufficient documentation

## 2018-02-18 DIAGNOSIS — Z79899 Other long term (current) drug therapy: Secondary | ICD-10-CM | POA: Diagnosis not present

## 2018-02-18 DIAGNOSIS — E7800 Pure hypercholesterolemia, unspecified: Secondary | ICD-10-CM | POA: Insufficient documentation

## 2018-02-18 DIAGNOSIS — Z9889 Other specified postprocedural states: Secondary | ICD-10-CM

## 2018-02-18 DIAGNOSIS — G8918 Other acute postprocedural pain: Secondary | ICD-10-CM | POA: Diagnosis not present

## 2018-02-18 DIAGNOSIS — Z170 Estrogen receptor positive status [ER+]: Secondary | ICD-10-CM

## 2018-02-18 DIAGNOSIS — K219 Gastro-esophageal reflux disease without esophagitis: Secondary | ICD-10-CM | POA: Diagnosis not present

## 2018-02-18 DIAGNOSIS — C50912 Malignant neoplasm of unspecified site of left female breast: Secondary | ICD-10-CM | POA: Diagnosis not present

## 2018-02-18 DIAGNOSIS — J9811 Atelectasis: Secondary | ICD-10-CM | POA: Diagnosis not present

## 2018-02-18 DIAGNOSIS — Z95828 Presence of other vascular implants and grafts: Secondary | ICD-10-CM

## 2018-02-18 HISTORY — DX: Adverse effect of unspecified anesthetic, initial encounter: T41.45XA

## 2018-02-18 HISTORY — DX: Other specified postprocedural states: Z98.890

## 2018-02-18 HISTORY — PX: PORTACATH PLACEMENT: SHX2246

## 2018-02-18 HISTORY — PX: BREAST LUMPECTOMY: SHX2

## 2018-02-18 HISTORY — DX: Other specified postprocedural states: R11.2

## 2018-02-18 HISTORY — PX: BREAST LUMPECTOMY WITH RADIOACTIVE SEED AND SENTINEL LYMPH NODE BIOPSY: SHX6550

## 2018-02-18 HISTORY — DX: Other complications of anesthesia, initial encounter: T88.59XA

## 2018-02-18 SURGERY — BREAST LUMPECTOMY WITH RADIOACTIVE SEED AND SENTINEL LYMPH NODE BIOPSY
Anesthesia: General | Site: Chest | Laterality: Right

## 2018-02-18 MED ORDER — SODIUM CHLORIDE 0.9 % IV SOLN: 250.0000 mL | INTRAVENOUS | Status: DC | PRN

## 2018-02-18 MED ORDER — MEPERIDINE HCL 25 MG/ML IJ SOLN: 6.2500 mg | INTRAMUSCULAR | Status: DC | PRN

## 2018-02-18 MED ORDER — LIDOCAINE 2% (20 MG/ML) 5 ML SYRINGE
INTRAMUSCULAR | Status: AC
  Filled 2018-02-18: qty 5

## 2018-02-18 MED ORDER — PROPOFOL 10 MG/ML IV BOLUS
INTRAVENOUS | Status: DC | PRN
  Administered 2018-02-18: 150 mg via INTRAVENOUS

## 2018-02-18 MED ORDER — EPHEDRINE 5 MG/ML INJ
INTRAVENOUS | Status: AC
  Filled 2018-02-18: qty 10

## 2018-02-18 MED ORDER — GABAPENTIN 300 MG PO CAPS
300.0000 mg | ORAL_CAPSULE | ORAL | Status: AC
  Administered 2018-02-18: 300 mg via ORAL

## 2018-02-18 MED ORDER — ONDANSETRON HCL 4 MG/2ML IJ SOLN
INTRAMUSCULAR | Status: AC
  Filled 2018-02-18: qty 2

## 2018-02-18 MED ORDER — GABAPENTIN 300 MG PO CAPS
ORAL_CAPSULE | ORAL | Status: AC
  Filled 2018-02-18: qty 1

## 2018-02-18 MED ORDER — ONDANSETRON HCL 4 MG/2ML IJ SOLN
INTRAMUSCULAR | Status: DC | PRN
  Administered 2018-02-18: 4 mg via INTRAVENOUS

## 2018-02-18 MED ORDER — SODIUM CHLORIDE 0.9% FLUSH: 3.0000 mL | Freq: Two times a day (BID) | INTRAVENOUS | Status: DC

## 2018-02-18 MED ORDER — FENTANYL CITRATE (PF) 100 MCG/2ML IJ SOLN: 25.0000 ug | INTRAMUSCULAR | Status: DC | PRN

## 2018-02-18 MED ORDER — SUCCINYLCHOLINE CHLORIDE 200 MG/10ML IV SOSY
PREFILLED_SYRINGE | INTRAVENOUS | Status: AC
  Filled 2018-02-18: qty 10

## 2018-02-18 MED ORDER — DEXAMETHASONE SODIUM PHOSPHATE 4 MG/ML IJ SOLN
INTRAMUSCULAR | Status: DC | PRN
  Administered 2018-02-18: 8 mg via INTRAVENOUS

## 2018-02-18 MED ORDER — MIDAZOLAM HCL 2 MG/2ML IJ SOLN
INTRAMUSCULAR | Status: AC
  Filled 2018-02-18: qty 2

## 2018-02-18 MED ORDER — LACTATED RINGERS IV SOLN: INTRAVENOUS | Status: DC

## 2018-02-18 MED ORDER — PROPOFOL 500 MG/50ML IV EMUL
INTRAVENOUS | Status: AC
  Filled 2018-02-18: qty 50

## 2018-02-18 MED ORDER — FENTANYL CITRATE (PF) 100 MCG/2ML IJ SOLN
INTRAMUSCULAR | Status: AC
  Filled 2018-02-18: qty 2

## 2018-02-18 MED ORDER — LACTATED RINGERS IV SOLN
INTRAVENOUS | Status: DC
  Administered 2018-02-18: 10:00:00 via INTRAVENOUS

## 2018-02-18 MED ORDER — SODIUM CHLORIDE (PF) 0.9 % IJ SOLN
INTRAVENOUS | Status: DC | PRN
  Administered 2018-02-18: 5 mL via INTRAMUSCULAR

## 2018-02-18 MED ORDER — ACETAMINOPHEN 500 MG PO TABS
1000.0000 mg | ORAL_TABLET | ORAL | Status: AC
  Administered 2018-02-18: 1000 mg via ORAL

## 2018-02-18 MED ORDER — OXYCODONE HCL 5 MG PO TABS: 5.0000 mg | ORAL_TABLET | ORAL | Status: DC | PRN

## 2018-02-18 MED ORDER — HEPARIN SOD (PORK) LOCK FLUSH 100 UNIT/ML IV SOLN
INTRAVENOUS | Status: DC | PRN
  Administered 2018-02-18: 500 [IU]

## 2018-02-18 MED ORDER — FENTANYL CITRATE (PF) 100 MCG/2ML IJ SOLN
50.0000 ug | INTRAMUSCULAR | Status: AC | PRN
  Administered 2018-02-18: 50 ug via INTRAVENOUS
  Administered 2018-02-18: 100 ug via INTRAVENOUS
  Administered 2018-02-18: 50 ug via INTRAVENOUS

## 2018-02-18 MED ORDER — CELECOXIB 100 MG PO CAPS
100.0000 mg | ORAL_CAPSULE | ORAL | Status: AC
  Administered 2018-02-18: 100 mg via ORAL

## 2018-02-18 MED ORDER — HEPARIN (PORCINE) IN NACL 1000-0.9 UT/500ML-% IV SOLN
INTRAVENOUS | Status: AC
  Filled 2018-02-18: qty 1000

## 2018-02-18 MED ORDER — HYDROMORPHONE HCL 1 MG/ML IJ SOLN
0.2500 mg | INTRAMUSCULAR | Status: DC | PRN
  Administered 2018-02-18: 0.5 mg via INTRAVENOUS

## 2018-02-18 MED ORDER — SCOPOLAMINE 1 MG/3DAYS TD PT72: 1.0000 | MEDICATED_PATCH | Freq: Once | TRANSDERMAL | Status: DC | PRN

## 2018-02-18 MED ORDER — PHENYLEPHRINE 40 MCG/ML (10ML) SYRINGE FOR IV PUSH (FOR BLOOD PRESSURE SUPPORT)
PREFILLED_SYRINGE | INTRAVENOUS | Status: AC
  Filled 2018-02-18: qty 10

## 2018-02-18 MED ORDER — CHLORHEXIDINE GLUCONATE CLOTH 2 % EX PADS: 6.0000 | MEDICATED_PAD | Freq: Once | CUTANEOUS | Status: DC

## 2018-02-18 MED ORDER — DEXAMETHASONE SODIUM PHOSPHATE 10 MG/ML IJ SOLN
INTRAMUSCULAR | Status: AC
  Filled 2018-02-18: qty 1

## 2018-02-18 MED ORDER — ACETAMINOPHEN 500 MG PO TABS: 1000.0000 mg | ORAL_TABLET | Freq: Four times a day (QID) | ORAL | Status: DC

## 2018-02-18 MED ORDER — LIDOCAINE HCL (CARDIAC) PF 100 MG/5ML IV SOSY
PREFILLED_SYRINGE | INTRAVENOUS | Status: DC | PRN
  Administered 2018-02-18: 75 mg via INTRAVENOUS

## 2018-02-18 MED ORDER — MIDAZOLAM HCL 2 MG/2ML IJ SOLN
1.0000 mg | INTRAMUSCULAR | Status: DC | PRN
  Administered 2018-02-18: 2 mg via INTRAVENOUS

## 2018-02-18 MED ORDER — HEPARIN (PORCINE) IN NACL 2-0.9 UNITS/ML
INTRAMUSCULAR | Status: AC | PRN
  Administered 2018-02-18: 1

## 2018-02-18 MED ORDER — ACETAMINOPHEN 650 MG RE SUPP: 650.0000 mg | RECTAL | Status: DC | PRN

## 2018-02-18 MED ORDER — ONDANSETRON HCL 4 MG/2ML IJ SOLN
4.0000 mg | Freq: Once | INTRAMUSCULAR | Status: AC | PRN
  Administered 2018-02-18: 4 mg via INTRAVENOUS

## 2018-02-18 MED ORDER — HYDROMORPHONE HCL 1 MG/ML IJ SOLN
INTRAMUSCULAR | Status: AC
  Filled 2018-02-18: qty 0.5

## 2018-02-18 MED ORDER — CELECOXIB 100 MG PO CAPS
ORAL_CAPSULE | ORAL | Status: AC
  Filled 2018-02-18: qty 1

## 2018-02-18 MED ORDER — VANCOMYCIN HCL IN DEXTROSE 1-5 GM/200ML-% IV SOLN
INTRAVENOUS | Status: AC
  Filled 2018-02-18: qty 200

## 2018-02-18 MED ORDER — BUPIVACAINE-EPINEPHRINE (PF) 0.25% -1:200000 IJ SOLN
INTRAMUSCULAR | Status: AC
  Filled 2018-02-18: qty 60

## 2018-02-18 MED ORDER — HYDROCODONE-ACETAMINOPHEN 5-325 MG PO TABS: 1.0000 | ORAL_TABLET | Freq: Four times a day (QID) | ORAL | 0 refills | Status: DC | PRN

## 2018-02-18 MED ORDER — ACETAMINOPHEN 500 MG PO TABS
ORAL_TABLET | ORAL | Status: AC
  Filled 2018-02-18: qty 2

## 2018-02-18 MED ORDER — ACETAMINOPHEN 325 MG PO TABS: 650.0000 mg | ORAL_TABLET | ORAL | Status: DC | PRN

## 2018-02-18 MED ORDER — VANCOMYCIN HCL IN DEXTROSE 500-5 MG/100ML-% IV SOLN
INTRAVENOUS | Status: AC
  Filled 2018-02-18: qty 100

## 2018-02-18 MED ORDER — TECHNETIUM TC 99M SULFUR COLLOID FILTERED
1.0000 | Freq: Once | INTRAVENOUS | Status: AC | PRN
  Administered 2018-02-18: 1 via INTRADERMAL

## 2018-02-18 MED ORDER — VANCOMYCIN HCL 10 G IV SOLR
1500.0000 mg | INTRAVENOUS | Status: AC
  Administered 2018-02-18: 1000 mg via INTRAVENOUS

## 2018-02-18 MED ORDER — HEPARIN SOD (PORK) LOCK FLUSH 100 UNIT/ML IV SOLN
INTRAVENOUS | Status: AC
  Filled 2018-02-18: qty 10

## 2018-02-18 MED ORDER — BUPIVACAINE-EPINEPHRINE (PF) 0.5% -1:200000 IJ SOLN
INTRAMUSCULAR | Status: DC | PRN
  Administered 2018-02-18: 30 mL

## 2018-02-18 MED ORDER — BUPIVACAINE-EPINEPHRINE (PF) 0.25% -1:200000 IJ SOLN
INTRAMUSCULAR | Status: DC | PRN
  Administered 2018-02-18: 20 mL

## 2018-02-18 MED ORDER — PROMETHAZINE HCL 25 MG/ML IJ SOLN: 6.2500 mg | Freq: Four times a day (QID) | INTRAMUSCULAR | Status: DC | PRN

## 2018-02-18 MED ORDER — SODIUM CHLORIDE 0.9% FLUSH: 3.0000 mL | INTRAVENOUS | Status: DC | PRN

## 2018-02-18 SURGICAL SUPPLY — 78 items
ADH SKN CLS APL DERMABOND .7 (GAUZE/BANDAGES/DRESSINGS) ×4
APPLIER CLIP 11 MED OPEN (CLIP) ×4
APR CLP MED 11 20 MLT OPN (CLIP) ×2
BAG DECANTER FOR FLEXI CONT (MISCELLANEOUS) ×4 IMPLANT
BINDER BREAST LRG (GAUZE/BANDAGES/DRESSINGS) IMPLANT
BINDER BREAST XLRG (GAUZE/BANDAGES/DRESSINGS) IMPLANT
BLADE HEX COATED 2.75 (ELECTRODE) ×4 IMPLANT
BLADE SURG 15 STRL LF DISP TIS (BLADE) ×4 IMPLANT
BLADE SURG 15 STRL SS (BLADE) ×8
CANISTER SUCT 1200ML W/VALVE (MISCELLANEOUS) ×4 IMPLANT
CHLORAPREP W/TINT 26ML (MISCELLANEOUS) ×4 IMPLANT
CLIP APPLIE 11 MED OPEN (CLIP) ×2 IMPLANT
COVER BACK TABLE 60X90IN (DRAPES) ×4 IMPLANT
COVER MAYO STAND STRL (DRAPES) ×4 IMPLANT
COVER PROBE 5X48 (MISCELLANEOUS) ×4
COVER PROBE W GEL 5X96 (DRAPES) ×4 IMPLANT
COVER WAND RF STERILE (DRAPES) IMPLANT
DECANTER SPIKE VIAL GLASS SM (MISCELLANEOUS) IMPLANT
DERMABOND ADVANCED (GAUZE/BANDAGES/DRESSINGS) ×4
DERMABOND ADVANCED .7 DNX12 (GAUZE/BANDAGES/DRESSINGS) ×2 IMPLANT
DEVICE DUBIN W/COMP PLATE 8390 (MISCELLANEOUS) ×4 IMPLANT
DRAPE C-ARM 42X72 X-RAY (DRAPES) ×4 IMPLANT
DRAPE LAPAROSCOPIC ABDOMINAL (DRAPES) ×6 IMPLANT
DRAPE UTILITY XL STRL (DRAPES) ×6 IMPLANT
DRSG PAD ABDOMINAL 8X10 ST (GAUZE/BANDAGES/DRESSINGS) ×4 IMPLANT
ELECT REM PT RETURN 9FT ADLT (ELECTROSURGICAL) ×4
ELECTRODE REM PT RTRN 9FT ADLT (ELECTROSURGICAL) ×2 IMPLANT
GAUZE SPONGE 4X4 12PLY STRL (GAUZE/BANDAGES/DRESSINGS) ×4 IMPLANT
GAUZE SPONGE 4X4 12PLY STRL LF (GAUZE/BANDAGES/DRESSINGS) IMPLANT
GLOVE BIO SURGEON STRL SZ 6.5 (GLOVE) ×1 IMPLANT
GLOVE BIO SURGEON STRL SZ7 (GLOVE) ×2 IMPLANT
GLOVE BIO SURGEONS STRL SZ 6.5 (GLOVE) ×1
GLOVE BIOGEL PI IND STRL 7.0 (GLOVE) IMPLANT
GLOVE BIOGEL PI IND STRL 7.5 (GLOVE) IMPLANT
GLOVE BIOGEL PI INDICATOR 7.0 (GLOVE) ×2
GLOVE BIOGEL PI INDICATOR 7.5 (GLOVE) ×2
GLOVE EUDERMIC 7 POWDERFREE (GLOVE) ×6 IMPLANT
GOWN STRL REUS W/ TWL LRG LVL3 (GOWN DISPOSABLE) ×2 IMPLANT
GOWN STRL REUS W/ TWL XL LVL3 (GOWN DISPOSABLE) ×2 IMPLANT
GOWN STRL REUS W/TWL LRG LVL3 (GOWN DISPOSABLE) ×12
GOWN STRL REUS W/TWL XL LVL3 (GOWN DISPOSABLE) ×8
ILLUMINATOR WAVEGUIDE N/F (MISCELLANEOUS) IMPLANT
IV CATH PLACEMENT UNIT 16 GA (IV SOLUTION) IMPLANT
IV KIT MINILOC 20X1 SAFETY (NEEDLE) IMPLANT
KIT CVR 48X5XPRB PLUP LF (MISCELLANEOUS) ×2 IMPLANT
KIT MARKER MARGIN INK (KITS) ×4 IMPLANT
KIT PORT POWER 8FR ISP CVUE (Port) ×4 IMPLANT
LIGHT WAVEGUIDE WIDE FLAT (MISCELLANEOUS) IMPLANT
NDL BLUNT 17GA (NEEDLE) IMPLANT
NDL HYPO 25X1 1.5 SAFETY (NEEDLE) ×4 IMPLANT
NDL SAFETY ECLIPSE 18X1.5 (NEEDLE) ×2 IMPLANT
NEEDLE BLUNT 17GA (NEEDLE) IMPLANT
NEEDLE HYPO 18GX1.5 SHARP (NEEDLE) ×4
NEEDLE HYPO 22GX1.5 SAFETY (NEEDLE) ×2 IMPLANT
NEEDLE HYPO 25X1 1.5 SAFETY (NEEDLE) ×8 IMPLANT
NS IRRIG 1000ML POUR BTL (IV SOLUTION) ×4 IMPLANT
PACK BASIN DAY SURGERY FS (CUSTOM PROCEDURE TRAY) ×4 IMPLANT
PAD ALCOHOL SWAB (MISCELLANEOUS) ×4 IMPLANT
PENCIL BUTTON HOLSTER BLD 10FT (ELECTRODE) ×4 IMPLANT
SET SHEATH INTRODUCER 10FR (MISCELLANEOUS) IMPLANT
SHEATH COOK PEEL AWAY SET 9F (SHEATH) IMPLANT
SHEET MEDIUM DRAPE 40X70 STRL (DRAPES) ×4 IMPLANT
SLEEVE SCD COMPRESS KNEE MED (MISCELLANEOUS) ×4 IMPLANT
SPONGE LAP 4X18 RFD (DISPOSABLE) ×4 IMPLANT
SUT MNCRL AB 4-0 PS2 18 (SUTURE) ×8 IMPLANT
SUT PROLENE 2 0 CT2 30 (SUTURE) ×4 IMPLANT
SUT SILK 2 0 SH (SUTURE) ×4 IMPLANT
SUT VIC AB 2-0 CT1 27 (SUTURE)
SUT VIC AB 2-0 CT1 TAPERPNT 27 (SUTURE) IMPLANT
SUT VIC AB 3-0 SH 27 (SUTURE) ×4
SUT VIC AB 3-0 SH 27X BRD (SUTURE) IMPLANT
SUT VICRYL 3-0 CR8 SH (SUTURE) ×4 IMPLANT
SYR 10ML LL (SYRINGE) ×8 IMPLANT
SYR 5ML LUER SLIP (SYRINGE) ×4 IMPLANT
TOWEL GREEN STERILE FF (TOWEL DISPOSABLE) ×8 IMPLANT
TUBE CONNECTING 20'X1/4 (TUBING) ×1
TUBE CONNECTING 20X1/4 (TUBING) ×3 IMPLANT
YANKAUER SUCT BULB TIP NO VENT (SUCTIONS) ×4 IMPLANT

## 2018-02-18 NOTE — Anesthesia Preprocedure Evaluation (Signed)
Anesthesia Evaluation  Patient identified by MRN, date of birth, ID band Patient awake    Reviewed: Allergy & Precautions, NPO status , Patient's Chart, lab work & pertinent test results  History of Anesthesia Complications (+) PONV  Airway Mallampati: I  TM Distance: >3 FB Neck ROM: Full    Dental   Pulmonary Current Smoker,    Pulmonary exam normal        Cardiovascular hypertension, Pt. on medications Normal cardiovascular exam     Neuro/Psych    GI/Hepatic GERD  Medicated and Controlled,  Endo/Other  Hyperthyroidism   Renal/GU      Musculoskeletal   Abdominal   Peds  Hematology   Anesthesia Other Findings   Reproductive/Obstetrics                             Anesthesia Physical Anesthesia Plan  ASA: II  Anesthesia Plan: General   Post-op Pain Management:  Regional for Post-op pain   Induction: Intravenous  PONV Risk Score and Plan: 3 and Ondansetron, Dexamethasone and Midazolam  Airway Management Planned: LMA  Additional Equipment:   Intra-op Plan:   Post-operative Plan: Extubation in OR  Informed Consent: I have reviewed the patients History and Physical, chart, labs and discussed the procedure including the risks, benefits and alternatives for the proposed anesthesia with the patient or authorized representative who has indicated his/her understanding and acceptance.     Plan Discussed with: CRNA and Surgeon  Anesthesia Plan Comments:         Anesthesia Quick Evaluation

## 2018-02-18 NOTE — Anesthesia Procedure Notes (Signed)
Anesthesia Regional Block: Pectoralis block   Pre-Anesthetic Checklist: ,, timeout performed, Correct Patient, Correct Site, Correct Laterality, Correct Procedure, Correct Position, site marked, Risks and benefits discussed,  Surgical consent,  Pre-op evaluation,  At surgeon's request and post-op pain management  Laterality: Left  Prep: chloraprep       Needles:  Injection technique: Single-shot     Needle Length: 9cm  Needle Gauge: 21     Additional Needles:   Narrative:  Start time: 02/18/2018 9:38 AM End time: 02/18/2018 9:48 AM Injection made incrementally with aspirations every 5 mL.  Performed by: Personally  Anesthesiologist: Lillia Abed, MD  Additional Notes: Monitors applied. Patient sedated. Sterile prep and drape,hand hygiene and sterile gloves were used. Relevant anatomy identified.Needle position confirmed.Local anesthetic injected incrementally after negative aspiration. Local anesthetic spread visualized. Vascular puncture avoided. No complications. Image printed for medical record.The patient tolerated the procedure well.

## 2018-02-18 NOTE — Progress Notes (Signed)
Nuclear Medicine staff performed nuc med injection. Pt tolerated well with no additional sedation. VS stable. Family called to bedside and updated.

## 2018-02-18 NOTE — Anesthesia Postprocedure Evaluation (Signed)
Anesthesia Post Note  Patient: Margaret Hodges  Procedure(s) Performed: LEFT BREAST LUMPECTOMY WITH RADIOACTIVE SEED AND LEFT AXILLARY DEEP SENTINEL LYMPH NODE BIOPSY, INJECT BLUE DYE LEFT BREAST (Left Breast) INSERTION PORT-A-CATH (Right Chest)     Patient location during evaluation: PACU Anesthesia Type: General Level of consciousness: awake and alert Pain management: pain level controlled Vital Signs Assessment: post-procedure vital signs reviewed and stable Respiratory status: spontaneous breathing, nonlabored ventilation, respiratory function stable and patient connected to nasal cannula oxygen Cardiovascular status: blood pressure returned to baseline and stable Postop Assessment: no apparent nausea or vomiting Anesthetic complications: no    Last Vitals:  Vitals:   02/18/18 1330 02/18/18 1400  BP: 134/73 137/63  Pulse: (!) 112 (!) 115  Resp: 13 15  Temp:    SpO2: 100% 95%    Last Pain:  Vitals:   02/18/18 1400  TempSrc:   PainSc: 5                  Schwanda Zima DAVID

## 2018-02-18 NOTE — Discharge Instructions (Signed)
PORT-A-CATH: POST OP INSTRUCTIONS  Always review your discharge instruction sheet given to you by the facility where your surgery was performed.   1. A prescription for pain medication may be given to you upon discharge. Take your pain medication as prescribed, if needed. If narcotic pain medicine is not needed, then you make take acetaminophen (Tylenol) or ibuprofen (Advil) as needed.  2. Take your usually prescribed medications unless otherwise directed. 3. If you need a refill on your pain medication, please contact our office. All narcotic pain medicine now requires a paper prescription.  Phoned in and fax refills are no longer allowed by law.  Prescriptions will not be filled after 5 pm or on weekends.  4. You should follow a light diet for the remainder of the day after your procedure. 5. Most patients will experience some mild swelling and/or bruising in the area of the incision. It may take several days to resolve. 6. It is common to experience some constipation if taking pain medication after surgery. Increasing fluid intake and taking a stool softener (such as Colace) will usually help or prevent this problem from occurring. A mild laxative (Milk of Magnesia or Miralax) should be taken according to package directions if there are no bowel movements after 48 hours.  7. Unless discharge instructions indicate otherwise, you may remove your bandages 48 hours after surgery, and you may shower at that time. You may have steri-strips (small white skin tapes) in place directly over the incision.  These strips should be left on the skin for 7-10 days.  If your surgeon used Dermabond (skin glue) on the incision, you may shower in 24 hours.  The glue will flake off over the next 2-3 weeks.  8. If your port is left accessed at the end of surgery (needle left in port), the dressing cannot get wet and should only by changed by a healthcare professional. When the port is no longer accessed (when the  needle has been removed), follow step 7.   9. ACTIVITIES:  Limit activity involving your arms for the next 72 hours. Do no strenuous exercise or activity for 1 week. You may drive when you are no longer taking prescription pain medication, you can comfortably wear a seatbelt, and you can maneuver your car. 10.You may need to see your doctor in the office for a follow-up appointment.  Please       check with your doctor.  11.When you receive a new Port-a-Cath, you will get a product guide and        ID card.  Please keep them in case you need them.  WHEN TO CALL YOUR DOCTOR [phone]): 1. Fever over 101.0 2. Chills 3. Continued bleeding from incision 4. Increased redness and tenderness at the site 5. Shortness of breath, difficulty breathing   The clinic staff is available to answer your questions during regular business hours. Please dont hesitate to call and ask to speak to one of the nurses or medical assistants for clinical concerns. If you have a medical emergency, go to the nearest emergency room or call 911.  A surgeon from [facility] is always on call at the hospital.     For further information, please visit [email or web address]    [employer] Phone Number [phone]  BREAST BIOPSY/ PARTIAL MASTECTOMY: POST OP INSTRUCTIONS  Always review your discharge instruction sheet given to you by the facility where your surgery was performed.  IF YOU HAVE DISABILITY OR FAMILY  LEAVE FORMS, YOU MUST BRING THEM TO THE OFFICE FOR PROCESSING.  DO NOT GIVE THEM TO YOUR DOCTOR.  1. A prescription for pain medication may be given to you upon discharge.  Take your pain medication as prescribed, if needed.  If narcotic pain medicine is not needed, then you may take acetaminophen (Tylenol) or ibuprofen (Advil) as needed. 2. Take your usually prescribed medications unless otherwise directed 3. If you need a refill on your pain medication,  please contact your pharmacy.  They will contact our office to request authorization.  Prescriptions will not be filled after 5pm or on week-ends. 4. You should eat very light the first 24 hours after surgery, such as soup, crackers, pudding, etc.  Resume your normal diet the day after surgery. 5. Most patients will experience some swelling and bruising in the breast.  Ice packs and a good support bra will help.  Swelling and bruising can take several days to resolve.  6. It is common to experience some constipation if taking pain medication after surgery.  Increasing fluid intake and taking a stool softener will usually help or prevent this problem from occurring.  A mild laxative (Milk of Magnesia or Miralax) should be taken according to package directions if there are no bowel movements after 48 hours. 7. Unless discharge instructions indicate otherwise, you may remove your bandages 24-48 hours after surgery, and you may shower at that time.  You may have steri-strips (small skin tapes) in place directly over the incision.  These strips should be left on the skin for 7-10 days.  If your surgeon used skin glue on the incision, you may shower in 24 hours.  The glue will flake off over the next 2-3 weeks.  Any sutures or staples will be removed at the office during your follow-up visit. 8. ACTIVITIES:  You may resume regular daily activities (gradually increasing) beginning the next day.  Wearing a good support bra or sports bra minimizes pain and swelling.  You may have sexual intercourse when it is comfortable. a. You may drive when you no longer are taking prescription pain medication, you can comfortably wear a seatbelt, and you can safely maneuver your car and apply brakes. b. RETURN TO WORK:  ______________________________________________________________________________________ 9. You should see your doctor in the office for a follow-up appointment approximately two weeks after your surgery.  Your  doctors nurse will typically make your follow-up appointment when she calls you with your pathology report.  Expect your pathology report 2-3 business days after your surgery.  You may call to check if you do not hear from [place] after three days. 10. OTHER INSTRUCTIONS: _______________________________________________________________________________________________ _____________________________________________________________________________________________________________________________________ _____________________________________________________________________________________________________________________________________ _____________________________________________________________________________________________________________________________________  WHEN TO CALL YOUR DOCTOR: 1. Fever over 101.0 2. Nausea and/or vomiting. 3. Extreme swelling or bruising. 4. Continued bleeding from incision. 5. Increased pain, redness, or drainage from the incision.  The clinic staff is available to answer your questions during regular business hours.  Please dont hesitate to call and ask to speak to one of the nurses for clinical concerns.  If you have a medical emergency, go to the nearest emergency room or call 911.  A surgeon from [facility] is always on call at the hospital.  For further questions, please visit [email or web address]  Post Anesthesia Home Care Instructions  Activity: Get plenty of rest for the remainder of the day. A responsible individual must stay with you for 24 hours following the procedure.  For the next 24 hours, DO NOT: -Drive a  car -Paediatric nurse -Drink alcoholic beverages -Take any medication unless instructed by your physician -Make any legal decisions or sign important papers.  Meals: Start with liquid foods such as gelatin or soup. Progress to regular foods as tolerated. Avoid greasy, spicy, heavy foods. If nausea and/or vomiting occur, drink  only clear liquids until the nausea and/or vomiting subsides. Call your physician if vomiting continues.  Special Instructions/Symptoms: Your throat may feel dry or sore from the anesthesia or the breathing tube placed in your throat during surgery. If this causes discomfort, gargle with warm salt water. The discomfort should disappear within 24 hours.  If you had a scopolamine patch placed behind your ear for the management of post- operative nausea and/or vomiting:  1. The medication in the patch is effective for 72 hours, after which it should be removed.  Wrap patch in a tissue and discard in the trash. Wash hands thoroughly with soap and water. 2. You may remove the patch earlier than 72 hours if you experience unpleasant side effects which may include dry mouth, dizziness or visual disturbances. 3. Avoid touching the patch. Wash your hands with soap and water after contact with the patch.

## 2018-02-18 NOTE — Transfer of Care (Signed)
Immediate Anesthesia Transfer of Care Note  Patient: Margaret Hodges  Procedure(s) Performed: LEFT BREAST LUMPECTOMY WITH RADIOACTIVE SEED AND LEFT AXILLARY DEEP SENTINEL LYMPH NODE BIOPSY, INJECT BLUE DYE LEFT BREAST (Left Breast) INSERTION PORT-A-CATH (Right Chest)  Patient Location: PACU  Anesthesia Type:GA combined with regional for post-op pain  Level of Consciousness: awake, alert  and drowsy  Airway & Oxygen Therapy: Patient Spontanous Breathing and Patient connected to face mask oxygen  Post-op Assessment: Report given to RN and Post -op Vital signs reviewed and stable  Post vital signs: Reviewed and stable  Last Vitals:  Vitals Value Taken Time  BP 138/73 02/18/2018 12:49 PM  Temp    Pulse 116 02/18/2018 12:51 PM  Resp 14 02/18/2018 12:51 PM  SpO2 100 % 02/18/2018 12:51 PM  Vitals shown include unvalidated device data.  Last Pain:  Vitals:   02/18/18 0927  TempSrc: Oral  PainSc: 0-No pain         Complications: No apparent anesthesia complications

## 2018-02-18 NOTE — Op Note (Signed)
Patient Name:           Margaret Hodges   Date of Surgery:        02/18/2018  Pre op Diagnosis:      Invasive ductal carcinoma left breast, lower inner quadrant, ER positive, HER-2 positive  Post op Diagnosis:    Same  Procedure:                 Insertion of PowerPort [place] 8 French tunneled venous vascular access device                                      Use of fluoroscopy for guidance and positioning                                       Inject blue dye left breast                                      Left breast lumpectomy with radioactive seed localization                                      Left axillary deep sentinel lymph node biopsy  Surgeon:                     Edsel Petrin. Dalbert Batman, M.D., FACS  Assistant:                      OR staff  Operative Indications:    This is a 72 year old female seen in the [facility] . She was referred by Emmit Pomfret at [facility] mammography and imaging. She is seen by Dr. Lisbeth Renshaw, Dr. Jana Hakim, and me. [name] is her PCP.       She has no prior history of breast problems. Recent screening mammogram showed a 1.4 cm mass in the left breast, lower inner quadrant, 7 o'clock position, posterior depth, 10 cm from the nipple.  Image guided biopsy shows high-grade, grade 3 invasive ductal carcinoma. HER-2 positive. ER 60%. PR negative. Ki-67 80%. She has been told this is a high-grade tumor and chemotherapy is advised. She agrees with chemotherapy and Port-A-Cath incision and definitive breast surgery.      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  Family history reveals sister died of ovarian cancer age 43. Otherwise no cancer syndromes      She takes Estrace and was told to discontinue that now which she states she will.      We had a one-hour discussion. We went over indications techniques and risks of Port-A-Cath insertion. We discussed the options of lumpectomy, sentinel node  biopsy radiation therapy and compare that to mastectomy with or without reconstruction. She is interested in breast conservation. I told her she was a good candidate for that. She will be scheduled for Port-A-Cath insertion, injection blue dye, left breast lumpectomy with radioactive seed localization, left axillary deep Sentinel lymph node mapping and biopsy  Operative Findings:       The Port-A-Cath was inserted through the right subclavian vein without difficulty.  The catheter flushed easily and had excellent  blood return.  The catheter tip appeared to be in the superior vena cava at the right atrial junction.  The left breast cancer was easily palpable, 2 cm in size, lower inner quadrant.  To achieve a negative superficial margin I made an elliptical skin incision around this that was at least 10 cm in length.  Specimen mammogram looked good and the the mass and the radioactive seed and the original marker clip appeared to be present within the specimen.  So the anterior margin is essentially the skin and the posterior margin is essentially the muscle.  This was a fairly generous lumpectomy.  In the left axilla I found 3 sentinel lymph nodes but they were not pathologically enlarged.  Procedure in Detail:          Pectoral block was performed by anesthesiology.  Technetium radionuclide was injected into the left breast by the nuclear medicine technician.  Patient underwent general anesthesia with LMA device.  Patient was positioned with her arms tucked at her sides and a small roll behind her shoulders.  The neck and chest were prepped and draped in a sterile fashion.  Intravenous antibiotics were given.  Surgical timeout was performed.      A right subclavian venipuncture was performed with a single pass.  Excellent blood return.  Wire threaded without difficulty.  Fluoroscopy confirmed that the wire was in the superior vena cava at the right atrium.  A small incision was made in the wire insertion  site.  I made a transverse incision about 4 cm below the clavicle.  Subcutaneous pocket was created.  Using a tunneling device I drew the catheter from the wire insertion site to the port pocket site.  Using a template that I drew on the chest wall using fluoroscopy I measured the catheter and cut it 22 cm in length.  The port was secured to the catheter using the locking device and the port and catheter flushed with heparinized saline.  The port was sutured to the pectoralis fascia with 3 interrupted sutures of 2-0 Prolene.  The dilator and peel-away sheath was inserted over the guidewire into the central venous circulation.  The dilator and wire were removed.  The catheter threaded easily and the peel-away sheath removed.  Fluoroscopy confirmed good positioning of the catheter with the tip at the junction of the superior vena cava in the right atrium.  The catheter flushed easily and had excellent blood return.  There was no ectopy.  The port and catheter were flushed with concentrated heparin.  There was no bleeding.  Subcutaneous tissue was closed with 3-0 Vicryl sutures and the skin closed with running some particular 4-0 Monocryl and Dermabond.    Using the neoprobe I mapped out the left breast cancer in the lower inner quadrant.  This was palpable.  I drew a fairly generous transverse elliptical incision, somewhat radially oriented back toward the areola.  This incision was made with a knife.  The lumpectomy was performed using electrocautery and the neoprobe.  The specimen was removed and marked with silk sutures and a 6 color ink kit to orient the pathologist.  The specimen mammogram looked very good as described above.  The specimen was sent to the lab where the seed was retrieved.  The wound was irrigated.  Hemostasis was excellent.  6 metal marker clips were placed in the walls of the lumpectomy cavity.  The lumpectomy cavity was closed in layers with 2-0 Vicryl, 3-0 Vicryl and then the skin  was closed  with a running subcuticular 4-0 Monocryl and Dermabond.      I change the setting of the neoprobe to technetium.  I made a transverse incision at the hairline in the left axilla.  Dissection was carried down through the clavipectoral fascia.  I carefully dissected out 3 sentinel lymph nodes that were very hot.  2 of these were blue and one was not.  After these were removed there was no more radioactivity and we completed the dissection.  The wound was irrigated.  Hemostasis was excellent.  Clavipectoral fascia was closed with 3-0 Vicryl sutures and the skin closed with a running subcuticular 4-0 Monocryl and Dermabond.  Dry bandages and a breast binder was placed.  The patient tolerated the procedure well and was taken to PACU in stable condition.  EBL 30 cc.  Counts correct.  Complications none.  Portable chest x-ray is planned.    Addendum: I logged onto the [employer] and reviewed her prescription medication history     Margaret Hodges M. Dalbert Batman, M.D., FACS General and Minimally Invasive Surgery Breast and Colorectal Surgery  02/18/2018 12:42 PM

## 2018-02-18 NOTE — Anesthesia Procedure Notes (Signed)
Procedure Name: LMA Insertion Date/Time: 02/18/2018 10:57 AM Performed by: Willa Frater, CRNA Pre-anesthesia Checklist: Patient identified, Emergency Drugs available, Suction available and Patient being monitored Patient Re-evaluated:Patient Re-evaluated prior to induction Oxygen Delivery Method: Circle system utilized Preoxygenation: Pre-oxygenation with 100% oxygen Induction Type: IV induction Ventilation: Mask ventilation without difficulty LMA: LMA inserted LMA Size: 4.0 Number of attempts: 1 Airway Equipment and Method: Bite block Placement Confirmation: positive ETCO2 Tube secured with: Tape Dental Injury: Teeth and Oropharynx as per pre-operative assessment

## 2018-02-18 NOTE — Progress Notes (Signed)
Dr. Dalbert Batman called and notified of Xray result.  New order obtained for furthur evaluation in radiology at the [facility].  Family given instructions as to where to go for the xray and they state understanding.

## 2018-02-18 NOTE — Progress Notes (Signed)
Assisted Dr. Ossey with left, ultrasound guided, pectoralis block. Side rails up, monitors on throughout procedure. See vital signs in flow sheet. Tolerated Procedure well. 

## 2018-02-18 NOTE — Interval H&P Note (Signed)
History and Physical Interval Note:  02/18/2018 10:23 AM  Margaret Hodges  has presented today for surgery, with the diagnosis of LEFT BREAST CANCER  The various methods of treatment have been discussed with the patient and family. After consideration of risks, benefits and other options for treatment, the patient has consented to  Procedure(s): LEFT BREAST LUMPECTOMY WITH RADIOACTIVE SEED AND LEFT AXILLARY DEEP SENTINEL LYMPH NODE BIOPSY, INJECT BLUE DYE LEFT BREAST (Left) INSERTION PORT-A-CATH WITH [place] (N/A) as a surgical intervention .  The patient's history has been reviewed, patient examined, no change in status, stable for surgery.  I have reviewed the patient's chart and labs.  Questions were answered to the patient's satisfaction.     Adin Hector

## 2018-02-19 ENCOUNTER — Encounter (HOSPITAL_BASED_OUTPATIENT_CLINIC_OR_DEPARTMENT_OTHER): Payer: Self-pay | Admitting: General Surgery

## 2018-02-19 NOTE — Addendum Note (Signed)
Addendum  created 02/19/18 1311 by Samra Pesch, Ernesta Amble, CRNA   Charge Capture section accepted

## 2018-02-27 ENCOUNTER — Other Ambulatory Visit: Payer: Medicare Other

## 2018-03-02 NOTE — Progress Notes (Signed)
[place]  [phone] [phone]     ID: BONI MACLELLAN DOB: 15-Mar-1946  MR#: [id number]  ZDG#:[id number]  Patient Care Team: [name], MD as PCP - General (Family Medicine) Fanny Skates, MD as Consulting Physician (General Surgery) Magrinat, Virgie Dad, MD as Consulting Physician (Oncology) Kyung Rudd, MD as Consulting Physician (Radiation Oncology) Allyn Kenner, MD (Dermatology) OTHER MD:  CHIEF COMPLAINT: Estrogen receptor positive breast cancer  CURRENT TREATMENT: Paclitaxel, trastuzumab   HISTORY OF CURRENT ILLNESS: From the original intake note:  Margaret Hodges had routine screening mammography on 01/03/2018 showing a possible abnormality in the left breast. She underwent bilateral diagnostic mammography with tomography and left breast ultrasonography at [facility] on 01/15/2018 showing: Breast density category B; a 1.3 cm round solid mass with an indistinct margin in the left breast at 7 o'clock posterior 8 cm from the nipple. No significant abnormalities found in the left axilla.  Accordingly on 01/16/2018 she proceeded to biopsy of the left breast area in question. The pathology from this procedure showed [phone]): Invasive ductal carcinoma, grade III. Prognostic indicators significant for: estrogen receptor, 60% positive, with weak staining intensity; progesterone receptor negative, Proliferation marker Ki67 at 80%. HER2 positive by immunohistochemistry, 3+.  The patient's subsequent history is as detailed below.  INTERVAL HISTORY: Margaret Hodges is here for follow up and treatment for her estrogen receptor positive breast cancer. She is accompanied by her daughter. She is status post lumpectomy and is doing well. She is still sore after surgery but not too painful. Has taken a few of the hydrocodone the first day and a few at night time. She takes tylenol mostly to help with the soreness. She denies constipation with hydrocodone,  fever or  bleeding.  She underwent left breast lumpectomy and left sentinel lymph node biopsy on 02/18/2018 showing: Breast, lumpectomy, left - INVASIVE DUCTAL CARCINOMA, MULTIFOCAL, LARGER TUMOR MEASURES 2.7 CM, NOTTINGHAM GRADE III OF III. - MARGINS OF RESECTION ARE NOT INVOLVED (CLOSEST MARGIN: 7 MM, INFERIOR). - BIOPSY SITE CHANGES.  Lymph node, sentinel, biopsy, Left - ONE LYMPH NODE, NEGATIVE FOR CARCINOMA (0/1). Lymph node, sentinel, biopsy, left - ONE LYMPH NODE, NEGATIVE FOR CARCINOMA (0/1). Lymph node, sentinel, biopsy, left -ONE LYMPH NODE, NEGATIVE FOR CARCINOMA (0/1). Lymph node, sentinel, biopsy, Left -ONE LYMPH NODE, NEGATIVE FOR CARCINOMA (0/1). Lymph node, sentinel, biopsy, left - ONE LYMPH NODE, NEGATIVE FOR CARCINOMA (0/1).   REVIEW OF SYSTEMS: Margaret Hodges is doing well overall. She denies unusual headaches, visual changes, nausea, vomiting, or dizziness. There has been no unusual cough, phlegm production, or pleurisy. This been no change in bowel or bladder habits. She denies unexplained fatigue or unexplained weight loss, bleeding, rash, or fever. A detailed review of systems was otherwise stable.     PAST MEDICAL HISTORY: Past Medical History:  Diagnosis Date  . Anginal pain ([place])    with admission to hospital due to high blood pressure  . Complication of anesthesia   . Family history of ovarian cancer   . GERD (gastroesophageal reflux disease)   . Guillain Barr syndrome ([place]) 1959  . Guillain Barr syndrome ([place]) 1959  . Guillain Barr syndrome ([place]) 1959  . Headache    sinus headaches  . History of hiatal hernia    was told pt. had one, but no testing  . Hypertensive urgency 04/05/2014  . Malignant neoplasm of lower-inner quadrant of left breast in female, estrogen receptor positive ([place]) 01/24/2018  . Neuromuscular disorder ([place])    Guillian- Josefa Half  Syndrome  . Pneumonia    hx. of walking pneumonia  . PONV (postoperative nausea and vomiting)     PAST SURGICAL  HISTORY: Past Surgical History:  Procedure Laterality Date  . ABDOMINAL HYSTERECTOMY     Total  . Bladder lift  15 years ago,  Uterus prolapse causing intestinal damage and surgery    . BREAST LUMPECTOMY WITH RADIOACTIVE SEED AND SENTINEL LYMPH NODE BIOPSY Left 02/18/2018   Procedure: LEFT BREAST LUMPECTOMY WITH RADIOACTIVE SEED AND LEFT AXILLARY DEEP SENTINEL LYMPH NODE BIOPSY, INJECT BLUE DYE LEFT BREAST;  Surgeon: Fanny Skates, MD;  Location: [facility];  Service: General;  Laterality: Left;  . PORTACATH PLACEMENT Right 02/18/2018   Procedure: INSERTION PORT-A-CATH;  Surgeon: Fanny Skates, MD;  Location: [facility];  Service: General;  Laterality: Right;  . Right torn meniscus repair x 2    . THYROID LOBECTOMY N/A 02/04/2015   Procedure: TOTAL THYROIDECTOMY;  Surgeon: Armandina Gemma, MD;  Location: [facility];  Service: General;  Laterality: N/A;  . TONSILLECTOMY    . TUMOR REMOVAL    . Tumors on arms removed,  Tumor on gum removed,  Tumor removed from inside ear      FAMILY HISTORY Family History  Problem Relation Age of Onset  . Ovarian cancer Sister   . Lung cancer Sister   . Throat cancer Brother   . Brain cancer Sister    She notes that her father died from old age at age 12. Patients' mother died from multiple comorbidities at age 28. The patient has 5 brothers and 6 sisters. Patients' sister had ovarian cancer at age 54 and lung cancer at 75, brother with throat cancer at age 67, and sister with brain cancer at age 39.   GYNECOLOGIC HISTORY:  No LMP recorded. Patient has had a hysterectomy. 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 Her husband was a Librarian, academic at [employer]. Her son, Margaret Hodges manages a call center for  medical supplies. Her daughter, Margaret Hodges, is Patient Care surgical coordinator at [employer] clinic.  The patient has 2 grandchildren. She attends [employer].     ADVANCED DIRECTIVES: Her [place] is her daughter, Margaret Hodges and can be reached at [phone].     HEALTH MAINTENANCE: Social History   Tobacco Use  . Smoking status: Current Every Day Smoker    Packs/day: 1.50    Types: Cigarettes  . Smokeless tobacco: Never Used  Substance Use Topics  . Alcohol use: No  . Drug use: No     Colonoscopy: Yes through [facility]  PAP:   Bone density:    Allergies  Allergen Reactions  . Allegra [Fexofenadine] Hives  . Clindamycin/Lincomycin Hives and Diarrhea  . Influenza A (H1n1) Monoval Vac Other (See Comments)    Guillian Barre Syndrome  . Keflex [Cephalexin] Hives  . Penicillins Swelling    Has patient had a PCN reaction causing immediate rash, facial/tongue/throat swelling, SOB or lightheadedness with hypotension: No Has patient had a PCN reaction causing severe rash involving mucus membranes or skin necrosis: No Has patient had a PCN reaction that required hospitalization No Has patient had a PCN reaction occurring within the last 10 years: No If all of the above answers  are "NO", then may proceed with Cephalosporin use.   . Prednisone Other (See Comments)    "heart attack symptoms" but even had heart cath & had no cardiac disease  . Protonix [Pantoprazole Sodium] Diarrhea    Tolerates Prilosec  . Statins Other (See Comments)    myalgias    Current Outpatient Medications  Medication Sig Dispense Refill  . amLODipine (NORVASC) 5 MG tablet Take 1 tablet (5 mg total) by mouth daily. 30 tablet 0  . Cholecalciferol (VITAMIN D) 2000 UNITS tablet Take 2,000 Units by mouth daily.    . diclofenac sodium (VOLTAREN) 1 % GEL Apply 4 g topically 4 (four) times daily as needed (arthritis pain).     Marland Kitchen HYDROcodone-acetaminophen (NORCO) 5-325 MG tablet  Take 1-2 tablets by mouth every 6 (six) hours as needed for moderate pain or severe pain. 30 tablet 0  . hyoscyamine (LEVBID) 0.375 MG 12 hr tablet Take 0.375 mg by mouth every 12 (twelve) hours as needed (blaader spasms).    Marland Kitchen levothyroxine (SYNTHROID) 88 MCG tablet Take 1 tablet (88 mcg total) by mouth daily before breakfast. 30 tablet 3  . lidocaine-prilocaine (EMLA) cream Apply to affected area once 30 g 3  . omeprazole (PRILOSEC) 20 MG capsule Take 20 mg by mouth daily.    Marland Kitchen Phenylephrine-Bromphen-DM (PHENYLEPHRINE COMPLEX PO) Take 5 mg by mouth as directed. Takes every morning and may takes up to 2 additional tablets if needed for sinus congestion during the day    . prochlorperazine (COMPAZINE) 10 MG tablet Take 1 tablet (10 mg total) by mouth every 6 (six) hours as needed (Nausea or vomiting). 30 tablet 1  . saccharomyces boulardii (FLORASTOR) 250 MG capsule Take 250 mg by mouth daily as needed (only when on antibiotic).     No current facility-administered medications for this visit.     OBJECTIVE: Middle-aged white woman in no acute distress  Vitals:   03/08/18 1028  BP: 133/75  Pulse: 88  Resp: 17  Temp: (!) 97.3 F (36.3 C)  SpO2: 98%     Body mass index is 24.63 kg/m.   Wt Readings from Last 3 Encounters:  03/08/18 162 lb (73.5 kg)  02/18/18 163 lb 2.3 oz (74 kg)  01/30/18 164 lb 12.8 oz (74.8 kg)      ECOG FS:1 - Symptomatic but completely ambulatory  Sclerae unicteric, EOMs intact Oropharynx clear and moist No cervical or supraclavicular adenopathy Lungs no rales or rhonchi Heart regular rate and rhythm Abd soft, nontender, positive bowel sounds MSK no focal spinal tenderness, no upper extremity lymphedema Neuro: nonfocal, well oriented, appropriate affect Breasts: The right breast is benign.  The left breast is status post recent lumpectomy.  The cosmetic result is excellent.  There is no evidence of dehiscence, erythema, or swelling.  In the medial aspect of  the incision there is a slight excess of flesh which creates a slightly asymmetric appearance over the sternum.  I reassured the patient that this is of no concern other than cosmetic.  Both axillae are benign  LAB RESULTS:  CMP     Component Value Date/Time   NA 139 01/30/2018 1205   K 4.5 01/30/2018 1205   CL 106 01/30/2018 1205   CO2 26 01/30/2018 1205   GLUCOSE 103 (H) 01/30/2018 1205   BUN 16 01/30/2018 1205   CREATININE 0.95 01/30/2018 1205   CALCIUM 9.8 01/30/2018 1205   PROT 7.5 01/30/2018 1205   ALBUMIN 3.9 01/30/2018 1205   AST 13 (  L) 01/30/2018 1205   ALT 17 01/30/2018 1205   ALKPHOS 89 01/30/2018 1205   BILITOT <0.2 (L) 01/30/2018 1205   GFRNONAA 59 (L) 01/30/2018 1205   GFRAA >60 01/30/2018 1205    Lab Results  Component Value Date   TOTALPROTELP 7.5 05/08/2017    No results found for: KPAFRELGTCHN, LAMBDASER, KAPLAMBRATIO  Lab Results  Component Value Date   WBC 13.1 (H) 01/30/2018   NEUTROABS 7.8 (H) 01/30/2018   HGB 13.8 01/30/2018   HCT 42.1 01/30/2018   MCV 100.5 (H) 01/30/2018   PLT 333 01/30/2018    '@LASTCHEMISTRY' @  No results found for: LABCA2  No components found for: DJMEQA834  No results for input(s): INR in the last 168 hours.  No results found for: LABCA2  No results found for: HDQ222  No results found for: LNL892  No results found for: JJH417  No results found for: CA2729  No components found for: HGQUANT  No results found for: CEA1 / No results found for: CEA1   No results found for: AFPTUMOR  No results found for: CHROMOGRNA  No results found for: PSA1  No visits with results within 3 Day(s) from this visit.  Latest known visit with results is:  Appointment on 01/30/2018  Component Date Value Ref Range Status  . Sodium 01/30/2018 139  135 - 145 mmol/L Final  . Potassium 01/30/2018 4.5  3.5 - 5.1 mmol/L Final  . Chloride 01/30/2018 106  98 - 111 mmol/L Final  . CO2 01/30/2018 26  22 - 32 mmol/L Final  . Glucose,  Bld 01/30/2018 103* 70 - 99 mg/dL Final  . BUN 01/30/2018 16  8 - 23 mg/dL Final  . Creatinine 01/30/2018 0.95  0.44 - 1.00 mg/dL Final  . Calcium 01/30/2018 9.8  8.9 - 10.3 mg/dL Final  . Total Protein 01/30/2018 7.5  6.5 - 8.1 g/dL Final  . Albumin 01/30/2018 3.9  3.5 - 5.0 g/dL Final  . AST 01/30/2018 13* 15 - 41 U/L Final  . ALT 01/30/2018 17  0 - 44 U/L Final  . Alkaline Phosphatase 01/30/2018 89  38 - 126 U/L Final  . Total Bilirubin 01/30/2018 <0.2* 0.3 - 1.2 mg/dL Final  . GFR, Est Non Af Am 01/30/2018 59* >60 mL/min Final  . GFR, Est AFR Am 01/30/2018 >60  >60 mL/min Final   Comment: (NOTE) The eGFR has been calculated using the CKD EPI equation. This calculation has not been validated in all clinical situations. eGFR's persistently <60 mL/min signify possible Chronic Kidney Disease.   Georgiann Hahn gap 01/30/2018 7  5 - 15 Final   Performed at [facility], [place] [address], [place], [place] [address]  . WBC Count 01/30/2018 13.1* 4.0 - 10.5 K/uL Final  . RBC 01/30/2018 4.19  3.87 - 5.11 MIL/uL Final  . Hemoglobin 01/30/2018 13.8  12.0 - 15.0 g/dL Final  . HCT 01/30/2018 42.1  36.0 - 46.0 % Final  . MCV 01/30/2018 100.5* 80.0 - 100.0 fL Final  . MCH 01/30/2018 32.9  26.0 - 34.0 pg Final  . MCHC 01/30/2018 32.8  30.0 - 36.0 g/dL Final  . RDW 01/30/2018 13.2  11.5 - 15.5 % Final  . Platelet Count 01/30/2018 333  150 - 400 K/uL Final  . nRBC 01/30/2018 0.0  0.0 - 0.2 % Final  . Neutrophils Relative % 01/30/2018 59  % Final  . Neutro Abs 01/30/2018 7.8* 1.7 - 7.7 K/uL Final  . Lymphocytes Relative 01/30/2018 30  %  Final  . Lymphs Abs 01/30/2018 3.9  0.7 - 4.0 K/uL Final  . Monocytes Relative 01/30/2018 7  % Final  . Monocytes Absolute 01/30/2018 0.9  0.1 - 1.0 K/uL Final  . Eosinophils Relative 01/30/2018 3  % Final  . Eosinophils Absolute 01/30/2018 0.4  0.0 - 0.5 K/uL Final  . Basophils Relative 01/30/2018 1  % Final  . Basophils Absolute 01/30/2018 0.1   0.0 - 0.1 K/uL Final  . Immature Granulocytes 01/30/2018 0  % Final  . Abs Immature Granulocytes 01/30/2018 0.04  0.00 - 0.07 K/uL Final   Performed at [facility], [place] [address] [place], [place] [address]    (this displays the last labs from the last 3 days)  Lab Results  Component Value Date   TOTALPROTELP 7.5 05/08/2017   (this displays SPEP labs)  No results found for: KPAFRELGTCHN, LAMBDASER, KAPLAMBRATIO (kappa/lambda light chains)  No results found for: HGBA, HGBA2QUANT, HGBFQUANT, HGBSQUAN (Hemoglobinopathy evaluation)   Lab Results  Component Value Date   LDH 171 05/08/2017    No results found for: IRON, TIBC, IRONPCTSAT (Iron and TIBC)  No results found for: FERRITIN  Urinalysis    Component Value Date/Time   COLORURINE YELLOW 04/04/2014 2112   APPEARANCEUR CLEAR 04/04/2014 2112   LABSPEC 1.017 04/04/2014 2112   PHURINE 6.0 04/04/2014 2112   GLUCOSEU NEGATIVE 04/04/2014 2112   HGBUR NEGATIVE 04/04/2014 2112   BILIRUBINUR NEGATIVE 04/04/2014 2112   [place] NEGATIVE 04/04/2014 2112   PROTEINUR NEGATIVE 04/04/2014 2112   UROBILINOGEN 0.2 04/04/2014 2112   NITRITE NEGATIVE 04/04/2014 2112   LEUKOCYTESUR NEGATIVE 04/04/2014 2112     STUDIES:  Path report discussed with the patient  ELIGIBLE FOR AVAILABLE RESEARCH PROTOCOL: no  ASSESSMENT: 72 y.o. [place] woman status post left breast lower inner quadrant biopsy 01/16/2018 for a clinical T1c N0, stage IA invasive ductal carcinoma, grade 3, estrogen receptor weakly positive, progesterone receptor negative, with an MIB-1 of 80%, and HER-2 amplified  (1) genetics 03/06/2018  (2) status post left lumpectomy and sentinel lymph node sampling 02/18/2018 for a pT2 pN0, stage IIA invasive ductal carcinoma, grade 3, with negative margins.  (a) a total of 5 lymph nodes were removed  (3) adjuvant chemo immunotherapy to consist of paclitaxel/trastuzumab weekly for 12 weeks, starting  03/08/2018.  (4) trastuzumab to be continued to complete a year  (a) echocardiogram 02/06/2018 shows an ejection fraction in the 60-65% range  (5) adjuvant radiation as appropriate  (6) antiestrogens to follow at the completion of local treatment.  (7) tobacco abuse disorder: The patient has been strongly advised to discontinue smoking   PLAN: Willeen did well with her surgery, and had no problems with her port.  She is following all the instructions including using the EMLA cream correctly and when asked how to take her nausea medication she was able to tell me again correctly that she will take in the evening of chemo and the next morning and then as needed.  We reviewed her path report which is very favorable, and we also went over her echocardiogram which shows a good ejection fraction  She understands the first treatment, which is today, is generally the most difficult 1.  She may well have a reaction to either or both of the drugs.  If she has a reaction it will occur here, not at home.  Hopefully the reaction would not recur with subsequent treatments.  I asked her to keep a diary of symptoms so we  can discuss that when she returns to see me a week from now.  After that we will see her on an every other treatment until she gets to treatment #8 or 9 and then for the last few treatment we will see her weekly.  I again cautioned her regarding problems with neuropathy  She knows to call for any other issues that may develop before the next visit here.  Magrinat, Virgie Dad, MD  03/08/18 11:08 AM Medical Oncology and Hematology [facility] [address] [place], [place] [address] Tel. [phone]    Fax. [phone]    Elie Goody, am acting as scribe for Dr. Virgie Dad. Magrinat.  I, Lurline Del MD, have reviewed the above documentation for accuracy and completeness, and I agree with the above.

## 2018-03-06 ENCOUNTER — Inpatient Hospital Stay: Payer: Medicare Other

## 2018-03-06 ENCOUNTER — Encounter: Payer: Self-pay | Admitting: Genetic Counselor

## 2018-03-06 ENCOUNTER — Inpatient Hospital Stay: Payer: Medicare Other | Attending: Oncology | Admitting: Genetic Counselor

## 2018-03-06 DIAGNOSIS — C50312 Malignant neoplasm of lower-inner quadrant of left female breast: Secondary | ICD-10-CM | POA: Diagnosis not present

## 2018-03-06 DIAGNOSIS — Z5112 Encounter for antineoplastic immunotherapy: Secondary | ICD-10-CM | POA: Insufficient documentation

## 2018-03-06 DIAGNOSIS — K219 Gastro-esophageal reflux disease without esophagitis: Secondary | ICD-10-CM | POA: Insufficient documentation

## 2018-03-06 DIAGNOSIS — Z1379 Encounter for other screening for genetic and chromosomal anomalies: Secondary | ICD-10-CM

## 2018-03-06 DIAGNOSIS — Z79899 Other long term (current) drug therapy: Secondary | ICD-10-CM | POA: Insufficient documentation

## 2018-03-06 DIAGNOSIS — Z170 Estrogen receptor positive status [ER+]: Secondary | ICD-10-CM | POA: Diagnosis not present

## 2018-03-06 DIAGNOSIS — Z90710 Acquired absence of both cervix and uterus: Secondary | ICD-10-CM | POA: Insufficient documentation

## 2018-03-06 DIAGNOSIS — Z8041 Family history of malignant neoplasm of ovary: Secondary | ICD-10-CM | POA: Insufficient documentation

## 2018-03-06 DIAGNOSIS — Z808 Family history of malignant neoplasm of other organs or systems: Secondary | ICD-10-CM | POA: Insufficient documentation

## 2018-03-06 DIAGNOSIS — G8918 Other acute postprocedural pain: Secondary | ICD-10-CM | POA: Insufficient documentation

## 2018-03-06 DIAGNOSIS — Z5111 Encounter for antineoplastic chemotherapy: Secondary | ICD-10-CM | POA: Insufficient documentation

## 2018-03-06 DIAGNOSIS — R5383 Other fatigue: Secondary | ICD-10-CM | POA: Insufficient documentation

## 2018-03-06 DIAGNOSIS — Z801 Family history of malignant neoplasm of trachea, bronchus and lung: Secondary | ICD-10-CM | POA: Insufficient documentation

## 2018-03-06 DIAGNOSIS — F17210 Nicotine dependence, cigarettes, uncomplicated: Secondary | ICD-10-CM | POA: Insufficient documentation

## 2018-03-06 NOTE — Progress Notes (Signed)
REFERRING PROVIDER: Chauncey Cruel, MD [place], [place] [address]  PRIMARY PROVIDER:  [name], MD  PRIMARY REASON FOR VISIT:  1. Malignant neoplasm of lower-inner quadrant of left breast in female, estrogen receptor positive ([place])   2. Family history of ovarian cancer      HISTORY OF PRESENT ILLNESS:   Margaret Hodges, a 72 y.o. female, was seen for a [facility] cancer genetics consultation at the request of Dr. Jana Hakim due to a personal and family history of cancer.  Margaret Hodges presents to clinic today to discuss the possibility of a hereditary predisposition to cancer, genetic testing, and to further clarify her future cancer risks, as well as potential cancer risks for family members.   In October 2019, at the age of 20, Margaret Hodges was diagnosed with cancer of the left breast. This was treated with lumpectomy, chemotherapy and radiation.    CANCER HISTORY:    Malignant neoplasm of lower-inner quadrant of left breast in female, estrogen receptor positive ([place])   01/24/2018 Initial Diagnosis    Malignant neoplasm of lower-inner quadrant of left breast in female, estrogen receptor positive ([place])    03/07/2018 -  Chemotherapy    The patient had PACLitaxel (TAXOL) 150 mg in sodium chloride 0.9 % 250 mL chemo infusion (</= 64m/m2), 80 mg/m2 = 150 mg, Intravenous,  Once, 0 of 12 cycles  for chemotherapy treatment.     03/07/2018 -  Chemotherapy    The patient had trastuzumab (HERCEPTIN) 588 mg in sodium chloride 0.9 % 250 mL chemo infusion, 8 mg/kg = 588 mg, Intravenous,  Once, 0 of 5 cycles  for chemotherapy treatment.       HORMONAL RISK FACTORS:  Menarche was at age 72  First live birth at age 72  OCP use for approximately 0 years.  Ovaries intact: no.  Hysterectomy: yes.  Menopausal status: postmenopausal.  HRT use: 10+ years. Colonoscopy: yes; normal. Mammogram within the last year: yes. Number of breast biopsies: 1. Up to date with  pelvic exams:  no. Any excessive radiation exposure in the past:  no  Past Medical History:  Diagnosis Date  . Anginal pain ([facility]    with admission to hospital due to high blood pressure  . Complication of anesthesia   . Family history of ovarian cancer   . GERD (gastroesophageal reflux disease)   . Guillain Barr syndrome ([facility] 1959  . Guillain Barr syndrome ([facility] 1959  . Guillain Barr syndrome ([facility] 1959  . Headache    sinus headaches  . History of hiatal hernia    was told pt. had one, but no testing  . Hypertensive urgency 04/05/2014  . Malignant neoplasm of lower-inner quadrant of left breast in female, estrogen receptor positive ([facility] 01/24/2018  . Neuromuscular disorder ([facility]    Guillian- Barre Syndrome  . Pneumonia    hx. of walking pneumonia  . PONV (postoperative nausea and vomiting)     Past Surgical History:  Procedure Laterality Date  . ABDOMINAL HYSTERECTOMY     Total  . Bladder lift  15 years ago,  Uterus prolapse causing intestinal damage and surgery    . BREAST LUMPECTOMY WITH RADIOACTIVE SEED AND SENTINEL LYMPH NODE BIOPSY Left 02/18/2018   Procedure: LEFT BREAST LUMPECTOMY WITH RADIOACTIVE SEED AND LEFT AXILLARY DEEP SENTINEL LYMPH NODE BIOPSY, INJECT BLUE DYE LEFT BREAST;  Surgeon: IFanny Skates MD;  [facility]  Service: General;  Laterality: Left;  . PORTACATH PLACEMENT Right 02/18/2018  Procedure: INSERTION PORT-A-CATH;  Surgeon: Fanny Skates, MD;  Location: [facility];  Service: General;  Laterality: Right;  . Right torn meniscus repair x 2    . THYROID LOBECTOMY N/A 02/04/2015   Procedure: TOTAL THYROIDECTOMY;  Surgeon: Armandina Gemma, MD;  Location: [facility];  Service: General;  Laterality: N/A;  . TONSILLECTOMY    . TUMOR REMOVAL    . Tumors on arms removed,  Tumor on gum removed,  Tumor removed from inside ear      Social History   Socioeconomic History  . Marital status: Married    Spouse name: Not  on file  . Number of children: Not on file  . Years of education: Not on file  . Highest education level: Not on file  Occupational History  . Not on file  Social Needs  . Financial resource strain: Not on file  . Food insecurity:    Worry: Not on file    Inability: Not on file  . Transportation needs:    Medical: Not on file    Non-medical: Not on file  Tobacco Use  . Smoking status: Current Every Day Smoker    Packs/day: 1.50    Types: Cigarettes  . Smokeless tobacco: Never Used  Substance and Sexual Activity  . Alcohol use: No  . Drug use: No  . Sexual activity: Not Currently  Lifestyle  . Physical activity:    Days per week: Not on file    Minutes per session: Not on file  . Stress: Not on file  Relationships  . Social connections:    Talks on phone: Not on file    Gets together: Not on file    Attends religious service: Not on file    Active member of club or organization: Not on file    Attends meetings of clubs or organizations: Not on file    Relationship status: Not on file  Other Topics Concern  . Not on file  Social History Narrative  . Not on file     FAMILY HISTORY:  We obtained a detailed, 4-generation family history.  Significant diagnoses are listed below: Family History  Problem Relation Age of Onset  . Ovarian cancer Sister   . Lung cancer Sister   . Throat cancer Brother   . Brain cancer Sister     The patient has a son and daughter who are cancer free.  She has 12 siblings.  One sister had ovarian cancer, one sister had a brain tumor and a brother had throat cancer.  The family is large with 6 or more aunts and uncles on each side of the family.  The patient does not report any other family history of cancer.  Margaret Hodges is unaware of previous family history of genetic testing for hereditary cancer risks. Patient's maternal ancestors are of [place] descent, and paternal ancestors are of Native [place] and possibly [place] descent. There is  no reported Ashkenazi Jewish ancestry. There is no known consanguinity.  GENETIC COUNSELING ASSESSMENT: Margaret Hodges is a 72 y.o. female with a personal and family history of cancer which is somewhat suggestive of a hereditary cancer syndrome and predisposition to cancer. We, therefore, discussed and recommended the following at today's visit.   DISCUSSION: We discussed that based on the age of onset of cancer in the family that the inheritance is more likely to be familial rather than hereditary.  However, about 5-10% of breast cancer is hereditary with most cases due to  BRCA mutations.  There are other genes that are associated with breast cancer and other genes that are associated with ovarian cancer.    We reviewed the characteristics, features and inheritance patterns of hereditary cancer syndromes. We also discussed genetic testing, including the appropriate family members to test, the process of testing, insurance coverage and turn-around-time for results. We discussed the implications of a negative, positive and/or variant of uncertain significant result. We recommended Margaret Hodges pursue genetic testing for the common hereditary cancer panel. The Hereditary Gene Panel offered by [facility] includes sequencing and/or deletion duplication testing of the following 47 genes: APC, ATM, AXIN2, BARD1, BMPR1A, BRCA1, BRCA2, BRIP1, CDH1, CDK4, CDKN2A (p14ARF), CDKN2A (p16INK4a), CHEK2, CTNNA1, DICER1, EPCAM (Deletion/duplication testing only), GREM1 (promoter region deletion/duplication testing only), KIT, MEN1, MLH1, MSH2, MSH3, MSH6, MUTYH, NBN, NF1, NHTL1, PALB2, PDGFRA, PMS2, POLD1, POLE, PTEN, RAD50, RAD51C, RAD51D, SDHB, SDHC, SDHD, SMAD4, SMARCA4. STK11, TP53, TSC1, TSC2, and VHL.  The following genes were evaluated for sequence changes only: SDHA and HOXB13 c.251G>A variant only.    Based on Margaret Hodges personal and family history of cancer, she meets medical criteria for genetic testing. Despite  that she meets criteria, she may still have an out of pocket cost. We discussed that if her out of pocket cost for testing is over $100, the laboratory will call and confirm whether she wants to proceed with testing.  If the out of pocket cost of testing is less than $100 she will be billed by the genetic testing laboratory.   PLAN: After considering the risks, benefits, and limitations, Margaret Hodges  provided informed consent to pursue genetic testing and the blood sample was sent to [facility] for analysis of the common hereditary cancer panel. Results should be available within approximately 2-3 weeks' time, at which point they will be disclosed by telephone to Margaret Hodges, as will any additional recommendations warranted by these results. Margaret Hodges will receive a summary of her genetic counseling visit and a copy of her results once available. This information will also be available in Epic. We encouraged Margaret Hodges to remain in contact with cancer genetics annually so that we can continuously update the family history and inform her of any changes in cancer genetics and testing that may be of benefit for her family. Margaret Hodges questions were answered to her satisfaction today. Our contact information was provided should additional questions or concerns arise.  Lastly, we encouraged Margaret Hodges to remain in contact with cancer genetics annually so that we can continuously update the family history and inform her of any changes in cancer genetics and testing that may be of benefit for this family.   Ms.  Hodges questions were answered to her satisfaction today. Our contact information was provided should additional questions or concerns arise. Thank you for the referral and allowing [place] to share in the care of your patient.   Rashel Okeefe P. Florene [place], [place], [facility] Certified Genetic Counselor [email or web address] phone: [phone]  The patient was seen for a total of 30 minutes in  face-to-face genetic counseling.  This patient was discussed with Drs. Magrinat, Lindi Adie and/or Burr Medico who agrees with the above.    _______________________________________________________________________ For Office Staff:  Number of people involved in session: 1 Was an Intern/ student involved with case: no

## 2018-03-07 ENCOUNTER — Other Ambulatory Visit: Payer: Medicare Other

## 2018-03-08 ENCOUNTER — Encounter: Payer: Self-pay | Admitting: Oncology

## 2018-03-08 ENCOUNTER — Ambulatory Visit: Payer: Medicare Other

## 2018-03-08 ENCOUNTER — Inpatient Hospital Stay: Payer: Medicare Other

## 2018-03-08 ENCOUNTER — Inpatient Hospital Stay (HOSPITAL_BASED_OUTPATIENT_CLINIC_OR_DEPARTMENT_OTHER): Payer: Medicare Other | Admitting: Oncology

## 2018-03-08 ENCOUNTER — Telehealth: Payer: Self-pay | Admitting: Oncology

## 2018-03-08 ENCOUNTER — Other Ambulatory Visit: Payer: Self-pay | Admitting: *Deleted

## 2018-03-08 ENCOUNTER — Ambulatory Visit (HOSPITAL_BASED_OUTPATIENT_CLINIC_OR_DEPARTMENT_OTHER): Payer: Medicare Other | Admitting: Medical

## 2018-03-08 VITALS — BP 126/71 | HR 74 | Temp 97.6°F | Resp 18

## 2018-03-08 VITALS — BP 133/75 | HR 88 | Temp 97.3°F | Resp 17 | Ht 68.0 in | Wt 162.0 lb

## 2018-03-08 DIAGNOSIS — Z801 Family history of malignant neoplasm of trachea, bronchus and lung: Secondary | ICD-10-CM

## 2018-03-08 DIAGNOSIS — Z8041 Family history of malignant neoplasm of ovary: Secondary | ICD-10-CM

## 2018-03-08 DIAGNOSIS — C50312 Malignant neoplasm of lower-inner quadrant of left female breast: Secondary | ICD-10-CM

## 2018-03-08 DIAGNOSIS — Z79899 Other long term (current) drug therapy: Secondary | ICD-10-CM

## 2018-03-08 DIAGNOSIS — Z720 Tobacco use: Secondary | ICD-10-CM

## 2018-03-08 DIAGNOSIS — Z170 Estrogen receptor positive status [ER+]: Principal | ICD-10-CM

## 2018-03-08 DIAGNOSIS — K219 Gastro-esophageal reflux disease without esophagitis: Secondary | ICD-10-CM | POA: Diagnosis not present

## 2018-03-08 DIAGNOSIS — Z808 Family history of malignant neoplasm of other organs or systems: Secondary | ICD-10-CM | POA: Diagnosis not present

## 2018-03-08 DIAGNOSIS — T8090XA Unspecified complication following infusion and therapeutic injection, initial encounter: Secondary | ICD-10-CM

## 2018-03-08 DIAGNOSIS — Z95828 Presence of other vascular implants and grafts: Secondary | ICD-10-CM

## 2018-03-08 DIAGNOSIS — Z5111 Encounter for antineoplastic chemotherapy: Secondary | ICD-10-CM | POA: Diagnosis not present

## 2018-03-08 DIAGNOSIS — F17210 Nicotine dependence, cigarettes, uncomplicated: Secondary | ICD-10-CM

## 2018-03-08 DIAGNOSIS — Z90710 Acquired absence of both cervix and uterus: Secondary | ICD-10-CM

## 2018-03-08 DIAGNOSIS — G8918 Other acute postprocedural pain: Secondary | ICD-10-CM | POA: Diagnosis not present

## 2018-03-08 DIAGNOSIS — Z5112 Encounter for antineoplastic immunotherapy: Secondary | ICD-10-CM | POA: Diagnosis not present

## 2018-03-08 DIAGNOSIS — R5383 Other fatigue: Secondary | ICD-10-CM | POA: Diagnosis not present

## 2018-03-08 LAB — CBC WITH DIFFERENTIAL/PLATELET
Abs Immature Granulocytes: 0.03 10*3/uL (ref 0.00–0.07)
BASOS PCT: 1 %
Basophils Absolute: 0.1 10*3/uL (ref 0.0–0.1)
EOS ABS: 0.4 10*3/uL (ref 0.0–0.5)
EOS PCT: 4 %
HCT: 40.6 % (ref 36.0–46.0)
Hemoglobin: 13.6 g/dL (ref 12.0–15.0)
Immature Granulocytes: 0 %
Lymphocytes Relative: 32 %
Lymphs Abs: 3.4 10*3/uL (ref 0.7–4.0)
MCH: 33.2 pg (ref 26.0–34.0)
MCHC: 33.5 g/dL (ref 30.0–36.0)
MCV: 99 fL (ref 80.0–100.0)
MONO ABS: 0.7 10*3/uL (ref 0.1–1.0)
Monocytes Relative: 7 %
NRBC: 0 % (ref 0.0–0.2)
Neutro Abs: 6.1 10*3/uL (ref 1.7–7.7)
Neutrophils Relative %: 56 %
Platelets: 329 10*3/uL (ref 150–400)
RBC: 4.1 MIL/uL (ref 3.87–5.11)
RDW: 13.6 % (ref 11.5–15.5)
WBC: 10.8 10*3/uL — AB (ref 4.0–10.5)

## 2018-03-08 LAB — COMPREHENSIVE METABOLIC PANEL
ALT: 13 U/L (ref 0–44)
ANION GAP: 12 (ref 5–15)
AST: 16 U/L (ref 15–41)
Albumin: 4.1 g/dL (ref 3.5–5.0)
Alkaline Phosphatase: 93 U/L (ref 38–126)
BUN: 15 mg/dL (ref 8–23)
CO2: 22 mmol/L (ref 22–32)
Calcium: 10.2 mg/dL (ref 8.9–10.3)
Chloride: 108 mmol/L (ref 98–111)
Creatinine, Ser: 0.82 mg/dL (ref 0.44–1.00)
GFR calc non Af Amer: >60 mL/min (ref >60)
Glucose, Bld: 97 mg/dL (ref 70–99)
POTASSIUM: 4.6 mmol/L (ref 3.5–5.1)
SODIUM: 142 mmol/L (ref 135–145)
TOTAL PROTEIN: 7.6 g/dL (ref 6.5–8.1)
Total Bilirubin: 0.4 mg/dL (ref 0.3–1.2)

## 2018-03-08 MED ORDER — FAMOTIDINE IN NACL 20-0.9 MG/50ML-% IV SOLN
INTRAVENOUS | Status: AC
  Filled 2018-03-08: qty 50

## 2018-03-08 MED ORDER — SODIUM CHLORIDE 0.9 % IV SOLN
20.0000 mg | Freq: Once | INTRAVENOUS | Status: AC
  Administered 2018-03-08: 20 mg via INTRAVENOUS
  Filled 2018-03-08: qty 2

## 2018-03-08 MED ORDER — SODIUM CHLORIDE 0.9% FLUSH
10.0000 mL | INTRAVENOUS | Status: DC | PRN
  Administered 2018-03-08: 10 mL
  Filled 2018-03-08: qty 10

## 2018-03-08 MED ORDER — DIPHENHYDRAMINE HCL 50 MG/ML IJ SOLN
25.0000 mg | Freq: Once | INTRAMUSCULAR | Status: AC
  Administered 2018-03-08: 25 mg via INTRAVENOUS

## 2018-03-08 MED ORDER — HEPARIN SOD (PORK) LOCK FLUSH 100 UNIT/ML IV SOLN
500.0000 [IU] | Freq: Once | INTRAVENOUS | Status: AC | PRN
  Administered 2018-03-08: 500 [IU]
  Filled 2018-03-08: qty 5

## 2018-03-08 MED ORDER — SODIUM CHLORIDE 0.9 % IV SOLN
80.0000 mg/m2 | Freq: Once | INTRAVENOUS | Status: AC
  Administered 2018-03-08: 150 mg via INTRAVENOUS
  Filled 2018-03-08: qty 25

## 2018-03-08 MED ORDER — DIPHENHYDRAMINE HCL 50 MG/ML IJ SOLN
25.0000 mg | Freq: Once | INTRAMUSCULAR | Status: AC | PRN
  Administered 2018-03-08: 25 mg via INTRAVENOUS

## 2018-03-08 MED ORDER — FAMOTIDINE IN NACL 20-0.9 MG/50ML-% IV SOLN
20.0000 mg | Freq: Once | INTRAVENOUS | Status: AC
  Administered 2018-03-08: 20 mg via INTRAVENOUS

## 2018-03-08 MED ORDER — DIPHENHYDRAMINE HCL 50 MG/ML IJ SOLN
INTRAMUSCULAR | Status: AC
  Filled 2018-03-08: qty 1

## 2018-03-08 MED ORDER — DIPHENHYDRAMINE HCL 25 MG PO CAPS
ORAL_CAPSULE | ORAL | Status: AC
  Filled 2018-03-08: qty 1

## 2018-03-08 MED ORDER — SODIUM CHLORIDE 0.9 % IV SOLN
Freq: Once | INTRAVENOUS | Status: AC
  Administered 2018-03-08: 13:00:00 via INTRAVENOUS
  Filled 2018-03-08: qty 250

## 2018-03-08 MED ORDER — SODIUM CHLORIDE 0.9% FLUSH
10.0000 mL | INTRAVENOUS | Status: DC | PRN
  Administered 2018-03-08: 10 mL via INTRAVENOUS
  Filled 2018-03-08: qty 10

## 2018-03-08 MED ORDER — ACETAMINOPHEN 325 MG PO TABS
650.0000 mg | ORAL_TABLET | Freq: Once | ORAL | Status: AC
  Administered 2018-03-08: 650 mg via ORAL

## 2018-03-08 MED ORDER — ACETAMINOPHEN 325 MG PO TABS
ORAL_TABLET | ORAL | Status: AC
  Filled 2018-03-08: qty 2

## 2018-03-08 MED ORDER — DIPHENHYDRAMINE HCL 25 MG PO CAPS
25.0000 mg | ORAL_CAPSULE | Freq: Once | ORAL | Status: AC
  Administered 2018-03-08: 25 mg via ORAL

## 2018-03-08 MED ORDER — TRASTUZUMAB CHEMO 150 MG IV SOLR
600.0000 mg | Freq: Once | INTRAVENOUS | Status: AC
  Administered 2018-03-08: 600 mg via INTRAVENOUS
  Filled 2018-03-08: qty 28.57

## 2018-03-08 NOTE — Telephone Encounter (Signed)
Per 12/12 no los °

## 2018-03-08 NOTE — Patient Instructions (Signed)
Implanted Port Home Guide An implanted port is a type of central line that is placed under the skin. Central lines are used to provide IV access when treatment or nutrition needs to be given through a person's veins. Implanted ports are used for long-term IV access. An implanted port may be placed because:  You need IV medicine that would be irritating to the small veins in your hands or arms.  You need long-term IV medicines, such as antibiotics.  You need IV nutrition for a long period.  You need frequent blood draws for lab tests.  You need dialysis.  Implanted ports are usually placed in the chest area, but they can also be placed in the upper arm, the abdomen, or the leg. An implanted port has two main parts:  Reservoir. The reservoir is round and will appear as a small, raised area under your skin. The reservoir is the part where a needle is inserted to give medicines or draw blood.  Catheter. The catheter is a thin, flexible tube that extends from the reservoir. The catheter is placed into a large vein. Medicine that is inserted into the reservoir goes into the catheter and then into the vein.  How will I care for my incision site? Do not get the incision site wet. Bathe or shower as directed by your health care provider. How is my port accessed? Special steps must be taken to access the port:  Before the port is accessed, a numbing cream can be placed on the skin. This helps numb the skin over the port site.  Your health care provider uses a sterile technique to access the port. ? Your health care provider must put on a mask and sterile gloves. ? The skin over your port is cleaned carefully with an antiseptic and allowed to dry. ? The port is gently pinched between sterile gloves, and a needle is inserted into the port.  Only "non-coring" port needles should be used to access the port. Once the port is accessed, a blood return should be checked. This helps ensure that the port  is in the vein and is not clogged.  If your port needs to remain accessed for a constant infusion, a clear (transparent) bandage will be placed over the needle site. The bandage and needle will need to be changed every week, or as directed by your health care provider.  Keep the bandage covering the needle clean and dry. Do not get it wet. Follow your health care provider's instructions on how to take a shower or bath while the port is accessed.  If your port does not need to stay accessed, no bandage is needed over the port.  What is flushing? Flushing helps keep the port from getting clogged. Follow your health care provider's instructions on how and when to flush the port. Ports are usually flushed with saline solution or a medicine called heparin. The need for flushing will depend on how the port is used.  If the port is used for intermittent medicines or blood draws, the port will need to be flushed: ? After medicines have been given. ? After blood has been drawn. ? As part of routine maintenance.  If a constant infusion is running, the port may not need to be flushed.  How long will my port stay implanted? The port can stay in for as long as your health care provider thinks it is needed. When it is time for the port to come out, surgery will be   done to remove it. The procedure is similar to the one performed when the port was put in. When should I seek immediate medical care? When you have an implanted port, you should seek immediate medical care if:  You notice a bad smell coming from the incision site.  You have swelling, redness, or drainage at the incision site.  You have more swelling or pain at the port site or the surrounding area.  You have a fever that is not controlled with medicine.  This information is not intended to replace advice given to you by your health care provider. Make sure you discuss any questions you have with your health care provider. Document  Released: 03/13/2005 Document Revised: 08/19/2015 Document Reviewed: 11/18/2012 Elsevier Interactive Patient Education  2017 [employer] Inc.  

## 2018-03-08 NOTE — Progress Notes (Signed)
Patient c/o itching to scalp X 2.  Taxol paused, NS administered.  Sandi Mealy, PA-C assessed patient in infusion.  Benadryl 25mg  IV given per verbal order from [employer], PA-C.  VS stable.  Itching subsided.  Taxol resumed.

## 2018-03-08 NOTE — Patient Instructions (Addendum)
[facility] Discharge Instructions for Patients Receiving Chemotherapy  Today you received the following chemotherapy agents:  Taxol and Herceptin.  To help prevent nausea and vomiting after your treatment, we encourage you to take your nausea medication as prescribed.  TAKE COMPAZINE 10 MG EVERY 6 HOURS AS NEEDED FOR NAUSEA.  DO  NOT DRIVE AFTER TAKING THIS MEDICATION AS IT CAN CAUSE DROWSINESS.  DO DRINK LOTS OF FLUIDS AS TOLERATED.  DO NOT EAT  GREASY NOR SPICY FOODS .  If you develop nausea and vomiting that is not controlled by your nausea medication, call the clinic.   BELOW ARE SYMPTOMS THAT SHOULD BE REPORTED IMMEDIATELY:  *FEVER GREATER THAN 100.5 F  *CHILLS WITH OR WITHOUT FEVER  NAUSEA AND VOMITING THAT IS NOT CONTROLLED WITH YOUR NAUSEA MEDICATION  *UNUSUAL SHORTNESS OF BREATH  *UNUSUAL BRUISING OR BLEEDING  TENDERNESS IN MOUTH AND THROAT WITH OR WITHOUT PRESENCE OF ULCERS  *URINARY PROBLEMS  *BOWEL PROBLEMS  UNUSUAL RASH Items with * indicate a potential emergency and should be followed up as soon as possible.  Feel free to call the clinic should you have any questions or concerns. The clinic phone number is [phone].  Please show the [facility] at check-in to the Emergency Department and triage nurse.   Paclitaxel injection(Taxol) What is this medicine? PACLITAXEL (PAK li TAX el) is a chemotherapy drug. It targets fast dividing cells, like cancer cells, and causes these cells to die. This medicine is used to treat ovarian cancer, breast cancer, and other cancers. This medicine may be used for other purposes; ask your health care provider or pharmacist if you have questions. COMMON BRAND NAME(S): Onxol, Taxol What should I tell my health care provider before I take this medicine? They need to know if you have any of these conditions: -blood disorders -irregular heartbeat -infection (especially a virus infection such as chickenpox,  cold sores, or herpes) -liver disease -previous or ongoing radiation therapy -an unusual or allergic reaction to paclitaxel, alcohol, polyoxyethylated castor oil, other chemotherapy agents, other medicines, foods, dyes, or preservatives -pregnant or trying to get pregnant -breast-feeding How should I use this medicine? This drug is given as an infusion into a vein. It is administered in a hospital or clinic by a specially trained health care professional. Talk to your pediatrician regarding the use of this medicine in children. Special care may be needed. Overdosage: If you think you have taken too much of this medicine contact a poison control center or emergency room at once. NOTE: This medicine is only for you. Do not share this medicine with others. What if I miss a dose? It is important not to miss your dose. Call your doctor or health care professional if you are unable to keep an appointment. What may interact with this medicine? Do not take this medicine with any of the following medications: -disulfiram -metronidazole This medicine may also interact with the following medications: -cyclosporine -diazepam -ketoconazole -medicines to increase blood counts like filgrastim, pegfilgrastim, sargramostim -other chemotherapy drugs like cisplatin, doxorubicin, epirubicin, etoposide, teniposide, vincristine -quinidine -testosterone -vaccines -verapamil Talk to your doctor or health care professional before taking any of these medicines: -acetaminophen -aspirin -ibuprofen -ketoprofen -naproxen This list may not describe all possible interactions. Give your health care provider a list of all the medicines, herbs, non-prescription drugs, or dietary supplements you use. Also tell them if you smoke, drink alcohol, or use illegal drugs. Some items may interact with your medicine. What should I  watch for while using this medicine? Your condition will be monitored carefully while you are  receiving this medicine. You will need important blood work done while you are taking this medicine. This medicine can cause serious allergic reactions. To reduce your risk you will need to take other medicine(s) before treatment with this medicine. If you experience allergic reactions like skin rash, itching or hives, swelling of the face, lips, or tongue, tell your doctor or health care professional right away. In some cases, you may be given additional medicines to help with side effects. Follow all directions for their use. This drug may make you feel generally unwell. This is not uncommon, as chemotherapy can affect healthy cells as well as cancer cells. Report any side effects. Continue your course of treatment even though you feel ill unless your doctor tells you to stop. Call your doctor or health care professional for advice if you get a fever, chills or sore throat, or other symptoms of a cold or flu. Do not treat yourself. This drug decreases your body's ability to fight infections. Try to avoid being around people who are sick. This medicine may increase your risk to bruise or bleed. Call your doctor or health care professional if you notice any unusual bleeding. Be careful brushing and flossing your teeth or using a toothpick because you may get an infection or bleed more easily. If you have any dental work done, tell your dentist you are receiving this medicine. Avoid taking products that contain aspirin, acetaminophen, ibuprofen, naproxen, or ketoprofen unless instructed by your doctor. These medicines may hide a fever. Do not become pregnant while taking this medicine. Women should inform their doctor if they wish to become pregnant or think they might be pregnant. There is a potential for serious side effects to an unborn child. Talk to your health care professional or pharmacist for more information. Do not breast-feed an infant while taking this medicine. Men are advised not to father a  child while receiving this medicine. This product may contain alcohol. Ask your pharmacist or healthcare provider if this medicine contains alcohol. Be sure to tell all healthcare providers you are taking this medicine. Certain medicines, like metronidazole and disulfiram, can cause an unpleasant reaction when taken with alcohol. The reaction includes flushing, headache, nausea, vomiting, sweating, and increased thirst. The reaction can last from 30 minutes to several hours. What side effects may I notice from receiving this medicine? Side effects that you should report to your doctor or health care professional as soon as possible: -allergic reactions like skin rash, itching or hives, swelling of the face, lips, or tongue -low blood counts - This drug may decrease the number of white blood cells, red blood cells and platelets. You may be at increased risk for infections and bleeding. -signs of infection - fever or chills, cough, sore throat, pain or difficulty passing urine -signs of decreased platelets or bleeding - bruising, pinpoint red spots on the skin, black, tarry stools, nosebleeds -signs of decreased red blood cells - unusually weak or tired, fainting spells, lightheadedness -breathing problems -chest pain -high or low blood pressure -mouth sores -nausea and vomiting -pain, swelling, redness or irritation at the injection site -pain, tingling, numbness in the hands or feet -slow or irregular heartbeat -swelling of the ankle, feet, hands Side effects that usually do not require medical attention (report to your doctor or health care professional if they continue or are bothersome): -bone pain -complete hair loss including hair on your  head, underarms, pubic hair, eyebrows, and eyelashes -changes in the color of fingernails -diarrhea -loosening of the fingernails -loss of appetite -muscle or joint pain -red flush to skin -sweating This list may not describe all possible side  effects. Call your doctor for medical advice about side effects. You may report side effects to FDA at 1-800-FDA-1088. Where should I keep my medicine? This drug is given in a hospital or clinic and will not be stored at home. NOTE: This sheet is a summary. It may not cover all possible information. If you have questions about this medicine, talk to your doctor, pharmacist, or health care provider.  2018 Elsevier/Gold Standard (2015-01-12 19:58:00)  Trastuzumab injection for infusion What is this medicine? TRASTUZUMAB (tras TOO zoo mab) is a monoclonal antibody. It is used to treat breast cancer and stomach cancer. This medicine may be used for other purposes; ask your health care provider or pharmacist if you have questions. COMMON BRAND NAME(S): Herceptin What should I tell my health care provider before I take this medicine? They need to know if you have any of these conditions: -heart disease -heart failure -lung or breathing disease, like asthma -an unusual or allergic reaction to trastuzumab, benzyl alcohol, or other medications, foods, dyes, or preservatives -pregnant or trying to get pregnant -breast-feeding How should I use this medicine? This drug is given as an infusion into a vein. It is administered in a hospital or clinic by a specially trained health care professional. Talk to your pediatrician regarding the use of this medicine in children. This medicine is not approved for use in children. Overdosage: If you think you have taken too much of this medicine contact a poison control center or emergency room at once. NOTE: This medicine is only for you. Do not share this medicine with others. What if I miss a dose? It is important not to miss a dose. Call your doctor or health care professional if you are unable to keep an appointment. What may interact with this medicine? This medicine may interact with the following medications: -certain types of chemotherapy, such as  daunorubicin, doxorubicin, epirubicin, and idarubicin This list may not describe all possible interactions. Give your health care provider a list of all the medicines, herbs, non-prescription drugs, or dietary supplements you use. Also tell them if you smoke, drink alcohol, or use illegal drugs. Some items may interact with your medicine. What should I watch for while using this medicine? Visit your doctor for checks on your progress. Report any side effects. Continue your course of treatment even though you feel ill unless your doctor tells you to stop. Call your doctor or health care professional for advice if you get a fever, chills or sore throat, or other symptoms of a cold or flu. Do not treat yourself. Try to avoid being around people who are sick. You may experience fever, chills and shaking during your first infusion. These effects are usually mild and can be treated with other medicines. Report any side effects during the infusion to your health care professional. Fever and chills usually do not happen with later infusions. Do not become pregnant while taking this medicine or for 7 months after stopping it. Women should inform their doctor if they wish to become pregnant or think they might be pregnant. Women of child-bearing potential will need to have a negative pregnancy test before starting this medicine. There is a potential for serious side effects to an unborn child. Talk to your health care professional  or pharmacist for more information. Do not breast-feed an infant while taking this medicine or for 7 months after stopping it. Women must use effective birth control with this medicine. What side effects may I notice from receiving this medicine? Side effects that you should report to your doctor or health care professional as soon as possible: -allergic reactions like skin rash, itching or hives, swelling of the face, lips, or tongue -chest pain or  palpitations -cough -dizziness -feeling faint or lightheaded, falls -fever -general ill feeling or flu-like symptoms -signs of worsening heart failure like breathing problems; swelling in your legs and feet -unusually weak or tired Side effects that usually do not require medical attention (report to your doctor or health care professional if they continue or are bothersome): -bone pain -changes in taste -diarrhea -joint pain -nausea/vomiting -weight loss This list may not describe all possible side effects. Call your doctor for medical advice about side effects. You may report side effects to FDA at 1-800-FDA-1088. Where should I keep my medicine? This drug is given in a hospital or clinic and will not be stored at home. NOTE: This sheet is a summary. It may not cover all possible information. If you have questions about this medicine, talk to your doctor, pharmacist, or health care provider.  2018 Elsevier/Gold Standard (2016-03-07 14:37:52)

## 2018-03-08 NOTE — Progress Notes (Signed)
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  Discussed one-time $1000 Radio broadcast assistant to assist with personal expenses while going through treatment.  Gave my card for any additional financial questions or concerns.

## 2018-03-11 ENCOUNTER — Telehealth: Payer: Self-pay | Admitting: *Deleted

## 2018-03-11 NOTE — Telephone Encounter (Signed)
Per follow up call pt states " no problems- feel great and everybody was wonderful to me "  No further needs at this time.

## 2018-03-11 NOTE — Telephone Encounter (Signed)
-----   Message from Jarvis Morgan, RN sent at 03/08/2018  4:35 PM EST ----- Regarding: Chemo follow up First time Herceptin, Taxol, Dr. Jannifer Rodney, TB, RN.

## 2018-03-12 NOTE — Progress Notes (Signed)
[place]  [phone]) [phone] Fax:([phone]) [phone]     ID: HASSET CHAVIANO DOB: 10/19/1945  MR#: [id number]  KMM#:[id number]  Patient Care Team: [name], MD as PCP - General (Family Medicine) Fanny Skates, MD as Consulting Physician (General Surgery) Magrinat, Virgie Dad, MD as Consulting Physician (Oncology) Kyung Rudd, MD as Consulting Physician (Radiation Oncology) Allyn Kenner, MD (Dermatology) OTHER MD:  CHIEF COMPLAINT: Estrogen receptor positive breast cancer  CURRENT TREATMENT: Paclitaxel, trastuzumab   HISTORY OF CURRENT ILLNESS: From the original intake note:  Rosezetta Schlatter had routine screening mammography on 01/03/2018 showing a possible abnormality in the left breast. She underwent bilateral diagnostic mammography with tomography and left breast ultrasonography at [facility] on 01/15/2018 showing: Breast density category B; a 1.3 cm round solid mass with an indistinct margin in the left breast at 7 o'clock posterior 8 cm from the nipple. No significant abnormalities found in the left axilla.  Accordingly on 01/16/2018 she proceeded to biopsy of the left breast area in question. The pathology from this procedure showed [phone]): Invasive ductal carcinoma, grade III. Prognostic indicators significant for: estrogen receptor, 60% positive, with weak staining intensity; progesterone receptor negative, Proliferation marker Ki67 at 80%. HER2 positive by immunohistochemistry, 3+.  The patient's subsequent history is as detailed below.   INTERVAL HISTORY: James is here for follow up and treatment for her estrogen receptor positive breast cancer.   She is receiving paclitaxel weekly, today being day 1 cycle 2 of 12 planned. She tolerated it well. The muscles in her legs hurt a few days after the treatment. Her legs did not hurt bad enough for her to take medicine for. She feels some fatigue.    She also continues on trastuzumab, given every 21 days,  today being day 8 cycle 1.  Her last echocardiogram on 02/06/18 showed an ejection fraction in the 60% - 65% range.    REVIEW OF SYSTEMS: [place] is doing well overall. Despite feeling mildly fatigued from treatment, she has been able to go about her daily schedule, and even cleaned her house the morning after a treatment dose.  She had a little bit of numbness and tingling in her left hand only and only temporarily.  She thinks this may be related to her prior left-sided axillary surgery or position while sleeping at night.  The patient denies unusual headaches, visual changes, nausea, vomiting, or dizziness. There has been no unusual cough, phlegm production, or pleurisy. This been no change in bowel or bladder habits. The patient denies unexplained weight loss, bleeding, rash, or fever. A detailed review of systems was otherwise noncontributory.    PAST MEDICAL HISTORY: Past Medical History:  Diagnosis Date  . Anginal pain ([place])    with admission to hospital due to high blood pressure  . Complication of anesthesia   . Family history of ovarian cancer   . GERD (gastroesophageal reflux disease)   . Guillain Barr syndrome ([place]) 1959  . Guillain Barr syndrome ([place]) 1959  . Guillain Barr syndrome ([place]) 1959  . Headache    sinus headaches  . History of hiatal hernia    was told pt. had one, but no testing  . Hypertensive urgency 04/05/2014  . Malignant neoplasm of lower-inner quadrant of left breast in female, estrogen receptor positive ([place]) 01/24/2018  . Neuromuscular disorder ([place])    Guillian- Barre Syndrome  . Pneumonia    hx. of walking pneumonia  . PONV (postoperative nausea and vomiting)     PAST SURGICAL  HISTORY: Past Surgical History:  Procedure Laterality Date  . ABDOMINAL HYSTERECTOMY     Total  . Bladder lift  15 years ago,  Uterus prolapse causing intestinal damage and surgery    . BREAST LUMPECTOMY WITH RADIOACTIVE SEED AND SENTINEL LYMPH NODE BIOPSY Left 02/18/2018    Procedure: LEFT BREAST LUMPECTOMY WITH RADIOACTIVE SEED AND LEFT AXILLARY DEEP SENTINEL LYMPH NODE BIOPSY, INJECT BLUE DYE LEFT BREAST;  Surgeon: Fanny Skates, MD;  Location: [place];  Service: General;  Laterality: Left;  . PORTACATH PLACEMENT Right 02/18/2018   Procedure: INSERTION PORT-A-CATH;  Surgeon: Fanny Skates, MD;  Location: [place];  Service: General;  Laterality: Right;  . Right torn meniscus repair x 2    . THYROID LOBECTOMY N/A 02/04/2015   Procedure: TOTAL THYROIDECTOMY;  Surgeon: Armandina Gemma, MD;  Location: [facility];  Service: General;  Laterality: N/A;  . TONSILLECTOMY    . TUMOR REMOVAL    . Tumors on arms removed,  Tumor on gum removed,  Tumor removed from inside ear      FAMILY HISTORY Family History  Problem Relation Age of Onset  . Ovarian cancer Sister   . Lung cancer Sister   . Throat cancer Brother   . Brain cancer Sister    She notes that her father died from old age at age 36. Patients' mother died from multiple comorbidities at age 82. The patient has 5 brothers and 6 sisters. Patients' sister had ovarian cancer at age 32 and lung cancer at 54, brother with throat cancer at age 75, and sister with brain cancer at age 59.   GYNECOLOGIC HISTORY:  No LMP recorded. Patient has had a hysterectomy. 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 Her husband was a Librarian, academic at [employer]. Her son, Natallie Ravenscroft manages a call center for medical supplies. Her daughter, [name], is Patient Care surgical coordinator at [employer] clinic.  The patient has 2 grandchildren. She attends [employer].     ADVANCED DIRECTIVES: Her [place] is her daughter, [name] and can be reached  at [phone].     HEALTH MAINTENANCE: Social History   Tobacco Use  . Smoking status: Current Every Day Smoker    Packs/day: 1.50    Types: Cigarettes  . Smokeless tobacco: Never Used  Substance Use Topics  . Alcohol use: No  . Drug use: No     Colonoscopy: Yes through [facility]  PAP:   Bone density:    Allergies  Allergen Reactions  . Allegra [Fexofenadine] Hives  . Clindamycin/Lincomycin Hives and Diarrhea  . Influenza A (H1n1) [place] Other (See Comments)    Guillian Barre Syndrome  . Keflex [Cephalexin] Hives  . Penicillins Swelling    Has patient had a PCN reaction causing immediate rash, facial/tongue/throat swelling, SOB or lightheadedness with hypotension: No Has patient had a PCN reaction causing severe rash involving mucus membranes or skin necrosis: No Has patient had a PCN reaction that required hospitalization No Has patient had a PCN reaction occurring within the last 10 years: No If all of the above answers are "NO", then may proceed with Cephalosporin use.   . Prednisone Other (See Comments)    "heart attack symptoms" but even had  heart cath & had no cardiac disease  . Protonix [Pantoprazole Sodium] Diarrhea    Tolerates Prilosec  . Statins Other (See Comments)    myalgias    Current Outpatient Medications  Medication Sig Dispense Refill  . amLODipine (NORVASC) 5 MG tablet Take 1 tablet (5 mg total) by mouth daily. 30 tablet 0  . Cholecalciferol (VITAMIN D) 2000 UNITS tablet Take 2,000 Units by mouth daily.    . diclofenac sodium (VOLTAREN) 1 % GEL Apply 4 g topically 4 (four) times daily as needed (arthritis pain).     Marland Kitchen HYDROcodone-acetaminophen (NORCO) 5-325 MG tablet Take 1-2 tablets by mouth every 6 (six) hours as needed for moderate pain or severe pain. 30 tablet 0  . hyoscyamine (LEVBID) 0.375 MG 12 hr tablet Take 0.375 mg by mouth every 12 (twelve) hours as needed (blaader spasms).    Marland Kitchen levothyroxine (SYNTHROID) 88 MCG tablet Take 1  tablet (88 mcg total) by mouth daily before breakfast. 30 tablet 3  . lidocaine-prilocaine (EMLA) cream Apply to affected area once 30 g 3  . omeprazole (PRILOSEC) 20 MG capsule Take 20 mg by mouth daily.    Marland Kitchen Phenylephrine-Bromphen-DM (PHENYLEPHRINE COMPLEX PO) Take 5 mg by mouth as directed. Takes every morning and may takes up to 2 additional tablets if needed for sinus congestion during the day    . prochlorperazine (COMPAZINE) 10 MG tablet Take 1 tablet (10 mg total) by mouth every 6 (six) hours as needed (Nausea or vomiting). 30 tablet 1  . saccharomyces boulardii (FLORASTOR) 250 MG capsule Take 250 mg by mouth daily as needed (only when on antibiotic).     No current facility-administered medications for this visit.     OBJECTIVE: Middle-aged white woman who appears well  Vitals:   03/15/18 0949  BP: 126/72  Pulse: 87  Resp: 18  Temp: 98.3 F (36.8 C)  SpO2: 98%     Body mass index is 24.97 kg/m.   Wt Readings from Last 3 Encounters:  03/15/18 164 lb 3.2 oz (74.5 kg)  03/08/18 162 lb (73.5 kg)  02/18/18 163 lb 2.3 oz (74 kg)      ECOG FS:1 - Symptomatic but completely ambulatory  Sclerae unicteric, pupils round and equal Oropharynx clear and moist No cervical or supraclavicular adenopathy Lungs no rales or rhonchi Heart regular rate and rhythm Abd soft, nontender, positive bowel sounds MSK no focal spinal tenderness, no upper extremity lymphedema Neuro: nonfocal, well oriented, appropriate affect Breasts: Right breast is unremarkable.  The left breast is status post lumpectomy and axillary lymph node sampling.  Both incisions are healing nicely, without dehiscence, erythema, or swelling.  Both axillae are benign.  LAB RESULTS:  CMP     Component Value Date/Time   NA 138 03/15/2018 0843   K 4.5 03/15/2018 0843   CL 107 03/15/2018 0843   CO2 23 03/15/2018 0843   GLUCOSE 104 (H) 03/15/2018 0843   BUN 12 03/15/2018 0843   CREATININE 0.78 03/15/2018 0843    CREATININE 0.95 01/30/2018 1205   CALCIUM 9.8 03/15/2018 0843   PROT 7.3 03/15/2018 0843   ALBUMIN 4.0 03/15/2018 0843   AST 13 (L) 03/15/2018 0843   AST 13 (L) 01/30/2018 1205   ALT 19 03/15/2018 0843   ALT 17 01/30/2018 1205   ALKPHOS 93 03/15/2018 0843   BILITOT 0.4 03/15/2018 0843   BILITOT <0.2 (L) 01/30/2018 1205   GFRNONAA >60 03/15/2018 0843   GFRNONAA 59 (L) 01/30/2018 1205   GFRAA >60  03/15/2018 0843   GFRAA >60 01/30/2018 1205    Lab Results  Component Value Date   TOTALPROTELP 7.5 05/08/2017    No results found for: Nils Pyle, [facility]  Lab Results  Component Value Date   WBC 9.5 03/15/2018   NEUTROABS 5.6 03/15/2018   HGB 13.4 03/15/2018   HCT 39.9 03/15/2018   MCV 100.0 03/15/2018   PLT 303 03/15/2018    _0 @  No results found for: LABCA2  No components found for: ZOXWRU045  No results for input(s): INR in the last 168 hours.  No results found for: LABCA2  No results found for: WUJ811  No results found for: BJY782  No results found for: NFA213  No results found for: CA2729  No components found for: HGQUANT  No results found for: CEA1 / No results found for: CEA1   No results found for: AFPTUMOR  No results found for: [place]  No results found for: PSA1  Appointment on 03/15/2018  Component Date Value Ref Range Status  . WBC 03/15/2018 9.5  4.0 - 10.5 K/uL Final  . RBC 03/15/2018 3.99  3.87 - 5.11 MIL/uL Final  . Hemoglobin 03/15/2018 13.4  12.0 - 15.0 g/dL Final  . HCT 03/15/2018 39.9  36.0 - 46.0 % Final  . MCV 03/15/2018 100.0  80.0 - 100.0 fL Final  . MCH 03/15/2018 33.6  26.0 - 34.0 pg Final  . MCHC 03/15/2018 33.6  30.0 - 36.0 g/dL Final  . RDW 03/15/2018 13.7  11.5 - 15.5 % Final  . Platelets 03/15/2018 303  150 - 400 K/uL Final  . nRBC 03/15/2018 0.0  0.0 - 0.2 % Final  . Neutrophils Relative % 03/15/2018 60  % Final  . Neutro Abs 03/15/2018 5.6  1.7 - 7.7 K/uL Final  . Lymphocytes  Relative 03/15/2018 28  % Final  . Lymphs Abs 03/15/2018 2.6  0.7 - 4.0 K/uL Final  . Monocytes Relative 03/15/2018 7  % Final  . Monocytes Absolute 03/15/2018 0.7  0.1 - 1.0 K/uL Final  . Eosinophils Relative 03/15/2018 4  % Final  . Eosinophils Absolute 03/15/2018 0.4  0.0 - 0.5 K/uL Final  . Basophils Relative 03/15/2018 1  % Final  . Basophils Absolute 03/15/2018 0.1  0.0 - 0.1 K/uL Final  . Immature Granulocytes 03/15/2018 0  % Final  . Abs Immature Granulocytes 03/15/2018 0.04  0.00 - 0.07 K/uL Final   Performed at [facility], [place] [address] [place], [place] [address]  . Sodium 03/15/2018 138  135 - 145 mmol/L Final  . Potassium 03/15/2018 4.5  3.5 - 5.1 mmol/L Final  . Chloride 03/15/2018 107  98 - 111 mmol/L Final  . CO2 03/15/2018 23  22 - 32 mmol/L Final  . Glucose, Bld 03/15/2018 104* 70 - 99 mg/dL Final  . BUN 03/15/2018 12  8 - 23 mg/dL Final  . Creatinine, Ser 03/15/2018 0.78  0.44 - 1.00 mg/dL Final  . Calcium 03/15/2018 9.8  8.9 - 10.3 mg/dL Final  . Total Protein 03/15/2018 7.3  6.5 - 8.1 g/dL Final  . Albumin 03/15/2018 4.0  3.5 - 5.0 g/dL Final  . AST 03/15/2018 13* 15 - 41 U/L Final  . ALT 03/15/2018 19  0 - 44 U/L Final  . Alkaline Phosphatase 03/15/2018 93  38 - 126 U/L Final  . Total Bilirubin 03/15/2018 0.4  0.3 - 1.2 mg/dL Final  . GFR calc non Af Amer 03/15/2018 >60  >60 mL/min Final  .  GFR calc Af Amer 03/15/2018 >60  >60 mL/min Final  . Anion gap 03/15/2018 8  5 - 15 Final   Performed at [facility], [address] [place], [place] [address]    (this displays the last labs from the last 3 days)  Lab Results  Component Value Date   TOTALPROTELP 7.5 05/08/2017   (this displays SPEP labs)  No results found for: KPAFRELGTCHN, LAMBDASER, KAPLAMBRATIO (kappa/lambda light chains)  No results found for: HGBA, HGBA2QUANT, HGBFQUANT, HGBSQUAN (Hemoglobinopathy evaluation)   Lab Results  Component  Value Date   LDH 171 05/08/2017    No results found for: IRON, TIBC, IRONPCTSAT (Iron and TIBC)  No results found for: FERRITIN  Urinalysis    Component Value Date/Time   COLORURINE YELLOW 04/04/2014 2112   APPEARANCEUR CLEAR 04/04/2014 2112   LABSPEC 1.017 04/04/2014 2112   PHURINE 6.0 04/04/2014 2112   GLUCOSEU NEGATIVE 04/04/2014 2112   HGBUR NEGATIVE 04/04/2014 2112   BILIRUBINUR NEGATIVE 04/04/2014 2112   [place] NEGATIVE 04/04/2014 2112   PROTEINUR NEGATIVE 04/04/2014 2112   UROBILINOGEN 0.2 04/04/2014 2112   NITRITE NEGATIVE 04/04/2014 2112   LEUKOCYTESUR NEGATIVE 04/04/2014 2112     STUDIES:  Path report discussed with the patient  ELIGIBLE FOR AVAILABLE RESEARCH PROTOCOL: no  ASSESSMENT: 72 y.o. [place] woman status post left breast lower inner quadrant biopsy 01/16/2018 for a clinical T1c N0, stage IA invasive ductal carcinoma, grade 3, estrogen receptor weakly positive, progesterone receptor negative, with an MIB-1 of 80%, and HER-2 amplified  (1) genetics 03/06/2018 showed no deleterious mutations  (2) status post left lumpectomy and sentinel lymph node sampling 02/18/2018 for a pT2 pN0, stage IIA invasive ductal carcinoma, grade 3, with negative margins.  (a) a total of 5 lymph nodes were removed  (3) adjuvant chemo immunotherapy consisting of paclitaxel/trastuzumab weekly for 12 weeks, started 03/08/2018.  (4) trastuzumab to be continued to complete a year  (a) echocardiogram 02/06/2018 shows an ejection fraction in the 60-65% range  (5) adjuvant radiation as appropriate  (6) antiestrogens to follow at the completion of local treatment.  (7) tobacco abuse disorder: The patient has been strongly advised to discontinue smoking   PLAN: Avyonna tolerated her first cycle of chemotherapy remarkably well.  I reassured her that the aches and pains that she had for a couple of days likely will not recur with subsequent cycles.  She had a little bit of  discomfort or numbness in her left hand but not the right and that has not recurred.  I do not think that represents neuropathy from her treatment.  It may be related to her recent surgery or it may be related to carpal tunnel issues.  She received her genetics reports today and it was very favorable.  She understands she will likely lose her hair within the next 2 weeks.  She already has a "sassy wig" available.  Normally we see her every other treatment for the first 8 cycles and then every treatment with the last for just to monitor for neuropathy.  I have entered the next 2 visit appointments today.  She knows to call for any other problems that may develop before the next scheduled visit.  , Virgie Dad, MD  03/15/18 10:21 AM Medical Oncology and Hematology [facility] [address] [place], [place] [address] Tel. [phone]    Fax. [phone]    I, Jacqualyn Posey am acting as a Education administrator for Chauncey Cruel, MD.  I, Lurline Del MD, have reviewed the above documentation for accuracy and completeness, and I agree with the above.

## 2018-03-13 NOTE — Progress Notes (Signed)
    DATE:  03/08/2018                                          X CHEMO/IMMUNOTHERAPY REACTION            MD: Dr. Gunnar Bulla Magrinat    AGENT/BLOOD PRODUCT RECEIVING TODAY:               Paclitaxel and trastuzumab   AGENT/BLOOD PRODUCT RECEIVING IMMEDIATELY PRIOR TO REACTION:           Paclitaxel   REACTION(S):            Itching of the scalp   PREMEDS:      Decadron 20 mg IV x1 and Benadryl 25 mg p.o. x1   INTERVENTION: Paclitaxel was paused.  The patient was given Benadryl 25 mg IV x1.   Review of Systems  Review of Systems  Constitutional: Negative for chills, diaphoresis and fever.  HENT: Negative for trouble swallowing and voice change.   Respiratory: Negative for cough, chest tightness, shortness of breath and wheezing.   Cardiovascular: Negative for chest pain and palpitations.  Gastrointestinal: Negative for abdominal pain, constipation, diarrhea, nausea and vomiting.  Musculoskeletal: Negative for back pain and myalgias.  Skin:       Itching of the scalp  Neurological: Negative for dizziness, light-headedness and headaches.     Physical Exam  Physical Exam Constitutional:      General: She is not in acute distress.    Appearance: She is not diaphoretic.  HENT:     Head: Normocephalic and atraumatic.  Cardiovascular:     Rate and Rhythm: Normal rate and regular rhythm.     Heart sounds: Normal heart sounds. No murmur. No friction rub. No gallop.   Pulmonary:     Effort: Pulmonary effort is normal. No respiratory distress.     Breath sounds: Normal breath sounds. No wheezing or rales.  Skin:    General: Skin is warm and dry.     Findings: No erythema or rash.  Neurological:     Mental Status: She is alert.     OUTCOME:           The patient's symptoms resolved.  She was able to complete the remainder of her paclitaxel infusion without incident.         Sandi Mealy, MHS, PA-C

## 2018-03-15 ENCOUNTER — Ambulatory Visit: Payer: Self-pay | Admitting: Genetic Counselor

## 2018-03-15 ENCOUNTER — Encounter: Payer: Self-pay | Admitting: *Deleted

## 2018-03-15 ENCOUNTER — Telehealth: Payer: Self-pay | Admitting: Oncology

## 2018-03-15 ENCOUNTER — Other Ambulatory Visit: Payer: Self-pay | Admitting: *Deleted

## 2018-03-15 ENCOUNTER — Inpatient Hospital Stay: Payer: Medicare Other

## 2018-03-15 ENCOUNTER — Encounter: Payer: Self-pay | Admitting: Genetic Counselor

## 2018-03-15 ENCOUNTER — Inpatient Hospital Stay (HOSPITAL_BASED_OUTPATIENT_CLINIC_OR_DEPARTMENT_OTHER): Payer: Medicare Other | Admitting: Oncology

## 2018-03-15 ENCOUNTER — Other Ambulatory Visit: Payer: Medicare Other

## 2018-03-15 VITALS — BP 126/72 | HR 87 | Temp 98.3°F | Resp 18 | Ht 68.0 in | Wt 164.2 lb

## 2018-03-15 DIAGNOSIS — R5383 Other fatigue: Secondary | ICD-10-CM

## 2018-03-15 DIAGNOSIS — Z5112 Encounter for antineoplastic immunotherapy: Secondary | ICD-10-CM | POA: Diagnosis not present

## 2018-03-15 DIAGNOSIS — Z79899 Other long term (current) drug therapy: Secondary | ICD-10-CM

## 2018-03-15 DIAGNOSIS — Z90710 Acquired absence of both cervix and uterus: Secondary | ICD-10-CM

## 2018-03-15 DIAGNOSIS — Z720 Tobacco use: Secondary | ICD-10-CM

## 2018-03-15 DIAGNOSIS — C50312 Malignant neoplasm of lower-inner quadrant of left female breast: Secondary | ICD-10-CM | POA: Diagnosis not present

## 2018-03-15 DIAGNOSIS — Z170 Estrogen receptor positive status [ER+]: Secondary | ICD-10-CM | POA: Diagnosis not present

## 2018-03-15 DIAGNOSIS — Z5111 Encounter for antineoplastic chemotherapy: Secondary | ICD-10-CM | POA: Diagnosis not present

## 2018-03-15 DIAGNOSIS — K219 Gastro-esophageal reflux disease without esophagitis: Secondary | ICD-10-CM | POA: Diagnosis not present

## 2018-03-15 DIAGNOSIS — G8918 Other acute postprocedural pain: Secondary | ICD-10-CM | POA: Diagnosis not present

## 2018-03-15 DIAGNOSIS — Z1379 Encounter for other screening for genetic and chromosomal anomalies: Secondary | ICD-10-CM | POA: Insufficient documentation

## 2018-03-15 DIAGNOSIS — Z95828 Presence of other vascular implants and grafts: Secondary | ICD-10-CM

## 2018-03-15 DIAGNOSIS — F17210 Nicotine dependence, cigarettes, uncomplicated: Secondary | ICD-10-CM

## 2018-03-15 LAB — CBC WITH DIFFERENTIAL/PLATELET
Abs Immature Granulocytes: 0.04 10*3/uL (ref 0.00–0.07)
Basophils Absolute: 0.1 10*3/uL (ref 0.0–0.1)
Basophils Relative: 1 %
EOS PCT: 4 %
Eosinophils Absolute: 0.4 10*3/uL (ref 0.0–0.5)
HCT: 39.9 % (ref 36.0–46.0)
Hemoglobin: 13.4 g/dL (ref 12.0–15.0)
Immature Granulocytes: 0 %
Lymphocytes Relative: 28 %
Lymphs Abs: 2.6 10*3/uL (ref 0.7–4.0)
MCH: 33.6 pg (ref 26.0–34.0)
MCHC: 33.6 g/dL (ref 30.0–36.0)
MCV: 100 fL (ref 80.0–100.0)
Monocytes Absolute: 0.7 10*3/uL (ref 0.1–1.0)
Monocytes Relative: 7 %
Neutro Abs: 5.6 10*3/uL (ref 1.7–7.7)
Neutrophils Relative %: 60 %
Platelets: 303 10*3/uL (ref 150–400)
RBC: 3.99 MIL/uL (ref 3.87–5.11)
RDW: 13.7 % (ref 11.5–15.5)
WBC: 9.5 10*3/uL (ref 4.0–10.5)
nRBC: 0 % (ref 0.0–0.2)

## 2018-03-15 LAB — COMPREHENSIVE METABOLIC PANEL
ALT: 19 U/L (ref 0–44)
AST: 13 U/L — ABNORMAL LOW (ref 15–41)
Albumin: 4 g/dL (ref 3.5–5.0)
Alkaline Phosphatase: 93 U/L (ref 38–126)
Anion gap: 8 (ref 5–15)
BUN: 12 mg/dL (ref 8–23)
CO2: 23 mmol/L (ref 22–32)
Calcium: 9.8 mg/dL (ref 8.9–10.3)
Chloride: 107 mmol/L (ref 98–111)
Creatinine, Ser: 0.78 mg/dL (ref 0.44–1.00)
Glucose, Bld: 104 mg/dL — ABNORMAL HIGH (ref 70–99)
Potassium: 4.5 mmol/L (ref 3.5–5.1)
Sodium: 138 mmol/L (ref 135–145)
TOTAL PROTEIN: 7.3 g/dL (ref 6.5–8.1)
Total Bilirubin: 0.4 mg/dL (ref 0.3–1.2)

## 2018-03-15 MED ORDER — SODIUM CHLORIDE 0.9% FLUSH
10.0000 mL | INTRAVENOUS | Status: DC | PRN
  Administered 2018-03-15: 10 mL
  Filled 2018-03-15: qty 10

## 2018-03-15 MED ORDER — FAMOTIDINE IN NACL 20-0.9 MG/50ML-% IV SOLN
20.0000 mg | Freq: Once | INTRAVENOUS | Status: AC
  Administered 2018-03-15: 20 mg via INTRAVENOUS

## 2018-03-15 MED ORDER — DIPHENHYDRAMINE HCL 50 MG/ML IJ SOLN
INTRAMUSCULAR | Status: AC
  Filled 2018-03-15: qty 1

## 2018-03-15 MED ORDER — DIPHENHYDRAMINE HCL 50 MG/ML IJ SOLN
25.0000 mg | Freq: Once | INTRAMUSCULAR | Status: AC
  Administered 2018-03-15: 25 mg via INTRAVENOUS

## 2018-03-15 MED ORDER — DEXAMETHASONE SODIUM PHOSPHATE 10 MG/ML IJ SOLN
INTRAMUSCULAR | Status: AC
  Filled 2018-03-15: qty 1

## 2018-03-15 MED ORDER — SODIUM CHLORIDE 0.9 % IV SOLN
80.0000 mg/m2 | Freq: Once | INTRAVENOUS | Status: AC
  Administered 2018-03-15: 150 mg via INTRAVENOUS
  Filled 2018-03-15: qty 25

## 2018-03-15 MED ORDER — DEXAMETHASONE SODIUM PHOSPHATE 10 MG/ML IJ SOLN
10.0000 mg | Freq: Once | INTRAMUSCULAR | Status: AC
  Administered 2018-03-15: 10 mg via INTRAVENOUS

## 2018-03-15 MED ORDER — SODIUM CHLORIDE 0.9 % IV SOLN: 10.0000 mg | Freq: Once | INTRAVENOUS | Status: DC

## 2018-03-15 MED ORDER — SODIUM CHLORIDE 0.9 % IV SOLN
Freq: Once | INTRAVENOUS | Status: AC
  Administered 2018-03-15: 11:00:00 via INTRAVENOUS
  Filled 2018-03-15: qty 250

## 2018-03-15 MED ORDER — HEPARIN SOD (PORK) LOCK FLUSH 100 UNIT/ML IV SOLN
500.0000 [IU] | Freq: Once | INTRAVENOUS | Status: AC | PRN
  Administered 2018-03-15: 500 [IU]
  Filled 2018-03-15: qty 5

## 2018-03-15 MED ORDER — FAMOTIDINE IN NACL 20-0.9 MG/50ML-% IV SOLN
INTRAVENOUS | Status: AC
  Filled 2018-03-15: qty 50

## 2018-03-15 NOTE — Progress Notes (Signed)
HPI:  [name] was previously seen in the [place] clinic due to a personal and family history of cancer and concerns regarding a hereditary predisposition to cancer. Please refer to our prior cancer genetics clinic note for more information regarding [name] medical, social and family histories, and our assessment and recommendations, at the time. [name] recent genetic test results were disclosed to her, as were recommendations warranted by these results. These results and recommendations are discussed in more detail below.  CANCER HISTORY:    Malignant neoplasm of lower-inner quadrant of left breast in female, estrogen receptor positive ([place])   01/24/2018 Initial Diagnosis    Malignant neoplasm of lower-inner quadrant of left breast in female, estrogen receptor positive ([place])    03/08/2018 -  Chemotherapy    The patient had trastuzumab (HERCEPTIN) 600 mg in sodium chloride 0.9 % 250 mL chemo infusion, 588 mg, Intravenous,  Once, 1 of 5 cycles Administration: 600 mg (03/08/2018)  for chemotherapy treatment.     03/08/2018 -  Chemotherapy    The patient had PACLitaxel (TAXOL) 150 mg in sodium chloride 0.9 % 250 mL chemo infusion (</= 59m/m2), 80 mg/m2 = 150 mg, Intravenous,  Once, 1 of 12 cycles Administration: 150 mg (03/08/2018)  for chemotherapy treatment.     03/14/2018 Genetic Testing    Negative genetic testing on the common hereditary cancer panel.  The Hereditary Gene Panel offered by [facility] includes sequencing and/or deletion duplication testing of the following 47 genes: APC, ATM, AXIN2, BARD1, BMPR1A, BRCA1, BRCA2, BRIP1, CDH1, CDK4, CDKN2A (p14ARF), CDKN2A (p16INK4a), CHEK2, CTNNA1, DICER1, EPCAM (Deletion/duplication testing only), GREM1 (promoter region deletion/duplication testing only), KIT, MEN1, MLH1, MSH2, MSH3, MSH6, MUTYH, NBN, NF1, NHTL1, PALB2, PDGFRA, PMS2, POLD1, POLE, PTEN, RAD50, RAD51C, RAD51D, SDHB, SDHC, SDHD, SMAD4, SMARCA4.  STK11, TP53, TSC1, TSC2, and VHL.  The following genes were evaluated for sequence changes only: SDHA and HOXB13 c.251G>A variant only. The report date is 03/14/2018.     FAMILY HISTORY:  We obtained a detailed, 4-generation family history.  Significant diagnoses are listed below: Family History  Problem Relation Age of Onset  . Ovarian cancer Sister   . Lung cancer Sister   . Throat cancer Brother   . Brain cancer Sister     The patient has a son and daughter who are cancer free.  She has 12 siblings.  One sister had ovarian cancer, one sister had a brain tumor and a brother had throat cancer.  The family is large with 6 or more aunts and uncles on each side of the family.  The patient does not report any other family history of cancer.  Margaret Hodges unaware of previous family history of genetic testing for hereditary cancer risks. Patient's maternal ancestors are of [place]cent, and paternal ancestors are of Native [place]. There is no reported Ashkenazi Jewish ancestry. There is no known consanguinity.  GENETIC TEST RESULTS: Genetic testing reported out on March 14, 2018 through the common hereditary cancer panel found no deleterious mutations. The Hereditary Gene Panel offered by [facility] includes sequencing and/or deletion duplication testing of the following 47 genes: APC, ATM, AXIN2, BARD1, BMPR1A, BRCA1, BRCA2, BRIP1, CDH1, CDK4, CDKN2A (p14ARF), CDKN2A (p16INK4a), CHEK2, CTNNA1, DICER1, EPCAM (Deletion/duplication testing only), GREM1 (promoter region deletion/duplication testing only), KIT, MEN1, MLH1, MSH2, MSH3, MSH6, MUTYH, NBN, NF1, NHTL1, PALB2, PDGFRA, PMS2, POLD1, POLE, PTEN, RAD50, RAD51C, RAD51D, SDHB, SDHC, SDHD, SMAD4, SMARCA4. STK11, TP53, TSC1, TSC2, and VHL.  The following genes were evaluated for sequence changes only: SDHA and HOXB13 c.251G>A variant only.  The test report has been scanned into EPIC and is located under the Molecular  Pathology section of the Results Review tab.    We discussed with [name] that since the current genetic testing is not perfect, it is possible there may be a gene mutation in one of these genes that current testing cannot detect, but that chance is small.  We also discussed, that it is possible that another gene that has not yet been discovered, or that we have not yet tested, is responsible for the cancer diagnoses in the family, and it is, therefore, important to remain in touch with cancer genetics in the future so that we can continue to offer [name] the most up to date genetic testing.   CANCER SCREENING RECOMMENDATIONS: This result is reassuring and indicates that [name] likely does not have an increased risk for a future cancer due to a mutation in one of these genes. This normal test also suggests that [name] cancer was most likely not due to an inherited predisposition associated with one of these genes.  Most cancers happen by chance and this negative test suggests that her cancer falls into this category.  We, therefore, recommended she continue to follow the cancer management and screening guidelines provided by her oncology and primary healthcare provider.   An individual's cancer risk and medical management are not determined by genetic test results alone. Overall cancer risk assessment incorporates additional factors, including personal medical history, family history, and any available genetic information that may result in a personalized plan for cancer prevention and surveillance.  RECOMMENDATIONS FOR FAMILY MEMBERS:  Women in this family might be at some increased risk of developing cancer, over the general population risk, simply due to the family history of cancer.  We recommended women in this family have a yearly mammogram beginning at age 71, or 16 years younger than the earliest onset of cancer, an annual clinical breast exam, and perform monthly breast  self-exams. Women in this family should also have a gynecological exam as recommended by their primary provider. All family members should have a colonoscopy by age 49.  FOLLOW-UP: Lastly, we discussed with [name] that cancer genetics is a rapidly advancing field and it is possible that new genetic tests will be appropriate for her and/or her family members in the future. We encouraged her to remain in contact with cancer genetics on an annual basis so we can update her personal and family histories and let her know of advances in cancer genetics that may benefit this family.   Our contact number was provided. [name] questions were answered to her satisfaction, and she knows she is welcome to call us at anytime with additional questions or concerns.   Roma Kayser, MS, [facility] Certified Genetic Counselor [email or web address]

## 2018-03-15 NOTE — Telephone Encounter (Signed)
Gave avs and calendar waiting to hear about 1/3 adding Mendel Ryder

## 2018-03-15 NOTE — Patient Instructions (Signed)
[facility] Discharge Instructions for Patients Receiving Chemotherapy  Today you received the following chemotherapy agent: Taxol  To help prevent nausea and vomiting after your treatment, we encourage you to take your nausea medication as prescribed.   If you develop nausea and vomiting that is not controlled by your nausea medication, call the clinic.   BELOW ARE SYMPTOMS THAT SHOULD BE REPORTED IMMEDIATELY:  *FEVER GREATER THAN 100.5 F  *CHILLS WITH OR WITHOUT FEVER  NAUSEA AND VOMITING THAT IS NOT CONTROLLED WITH YOUR NAUSEA MEDICATION  *UNUSUAL SHORTNESS OF BREATH  *UNUSUAL BRUISING OR BLEEDING  TENDERNESS IN MOUTH AND THROAT WITH OR WITHOUT PRESENCE OF ULCERS  *URINARY PROBLEMS  *BOWEL PROBLEMS  UNUSUAL RASH Items with * indicate a potential emergency and should be followed up as soon as possible.  Feel free to call the clinic should you have any questions or concerns. The clinic phone number is [phone].  Please show the CHEMO ALERT CARD at check-in to the Emergency Department and triage nurse.   

## 2018-03-15 NOTE — Patient Instructions (Signed)
Saw patient in clinic.  Revealed negative genetic testing.  Discussed that we do not know why she has breast cancer or why there is cancer in the family. It could be due to a different gene that we are not testing, or maybe our current technology may not be able to pick something up.  It will be important for her to keep in contact with genetics to keep up with whether additional testing may be needed.

## 2018-03-18 ENCOUNTER — Ambulatory Visit: Payer: Medicare Other | Attending: General Surgery | Admitting: Physical Therapy

## 2018-03-18 ENCOUNTER — Encounter: Payer: Self-pay | Admitting: Physical Therapy

## 2018-03-18 ENCOUNTER — Other Ambulatory Visit: Payer: Self-pay

## 2018-03-18 DIAGNOSIS — Z483 Aftercare following surgery for neoplasm: Secondary | ICD-10-CM | POA: Insufficient documentation

## 2018-03-18 DIAGNOSIS — Z170 Estrogen receptor positive status [ER+]: Secondary | ICD-10-CM | POA: Insufficient documentation

## 2018-03-18 DIAGNOSIS — C50312 Malignant neoplasm of lower-inner quadrant of left female breast: Secondary | ICD-10-CM | POA: Insufficient documentation

## 2018-03-18 DIAGNOSIS — R293 Abnormal posture: Secondary | ICD-10-CM | POA: Diagnosis not present

## 2018-03-18 NOTE — Therapy (Signed)
[place], [place], [address] Phone: [phone]   Fax:  [phone]  Physical Therapy Treatment  Patient Details  Name: Margaret Hodges MRN: [id number] Date of Birth: [birth year] Referring Provider (PT): Dr. Fanny Skates   Encounter Date: 03/18/2018  PT End of Session - 03/18/18 1123    Visit Number  2    Number of Visits  2    PT Start Time  1100    PT Stop Time  1128    PT Time Calculation (min)  28 min    Activity Tolerance  Patient tolerated treatment well    Behavior During Therapy  [facility] for tasks assessed/performed       Past Medical History:  Diagnosis Date  . Anginal pain ([place])    with admission to hospital due to high blood pressure  . Complication of anesthesia   . Family history of ovarian cancer   . GERD (gastroesophageal reflux disease)   . Guillain Barr syndrome ([place]) 1959  . Guillain Barr syndrome ([place]) 1959  . Guillain Barr syndrome ([place]) 1959  . Headache    sinus headaches  . History of hiatal hernia    was told pt. had one, but no testing  . Hypertensive urgency 04/05/2014  . Malignant neoplasm of lower-inner quadrant of left breast in female, estrogen receptor positive ([place]) 01/24/2018  . Neuromuscular disorder ([place])    Guillian- Barre Syndrome  . Pneumonia    hx. of walking pneumonia  . PONV (postoperative nausea and vomiting)     Past Surgical History:  Procedure Laterality Date  . ABDOMINAL HYSTERECTOMY     Total  . Bladder lift  15 years ago,  Uterus prolapse causing intestinal damage and surgery    . BREAST LUMPECTOMY WITH RADIOACTIVE SEED AND SENTINEL LYMPH NODE BIOPSY Left 02/18/2018   Procedure: LEFT BREAST LUMPECTOMY WITH RADIOACTIVE SEED AND LEFT AXILLARY DEEP SENTINEL LYMPH NODE BIOPSY, INJECT BLUE DYE LEFT BREAST;  Surgeon: Fanny Skates, MD;  Location: [facility];  Service: General;  Laterality: Left;  . PORTACATH PLACEMENT Right  02/18/2018   Procedure: INSERTION PORT-A-CATH;  Surgeon: Fanny Skates, MD;  Location: [facility];  Service: General;  Laterality: Right;  . Right torn meniscus repair x 2    . THYROID LOBECTOMY N/A 02/04/2015   Procedure: TOTAL THYROIDECTOMY;  Surgeon: Armandina Gemma, MD;  Location: [facility];  Service: General;  Laterality: N/A;  . TONSILLECTOMY    . TUMOR REMOVAL    . Tumors on arms removed,  Tumor on gum removed,  Tumor removed from inside ear      There were no vitals filed for this visit.  Subjective Assessment - 03/18/18 1058    Subjective  Patient reports she underwent a left lumpectomy and sentinel node biopsy (0/5 positive nodes) on 02/18/18. She began chemotherapy on 03/08/18 for triple positive breast cancer. This will be followed by radiation and anti-estrogen therapy.    Pertinent History  Patient was diagnosed on 01/03/18 with left grade III invasive ductal carcinoma. Patient reports she underwent a left lumpectomy and sentinel node biopsy (0/5 positive nodes) on 02/18/18.    Patient Stated Goals  Make sure my arm is ok.    Currently in Pain?  No/denies         [facility] PT Assessment - 03/18/18 0001      Assessment   Medical Diagnosis  s/p left lumpectomy and SLNB    Referring Provider (PT)  Dr. Fanny Skates    Onset Date/Surgical Date  02/18/18    Hand Dominance  Right    Prior Therapy  Baseline assessment      Precautions   Precautions  Other (comment)    Precaution Comments  recent surgery      Restrictions   Weight Bearing Restrictions  No      Balance Screen   Has the patient fallen in the past 6 months  No    Has the patient had a decrease in activity level because of a fear of falling?   No    Is the patient reluctant to leave their home because of a fear of falling?   No      Home Film/video editor residence    Living Arrangements  Spouse/significant other   Husband is terminally ill   Available Help at Discharge   Family      Prior Function   Level of [place]  Retired    Leisure  She walks daily 15-20 minutes but has not returned to [employer]   Overall Cognitive Status  Within Functional Limits for tasks assessed      Observation/Other Assessments   Observations  Left axillary and breast incisions both appear to be healing well. Mild edema present in left axillary region; compression foam issued for that.      Posture/Postural Control   Posture/Postural Control  Postural limitations    Postural Limitations  Rounded Shoulders;Forward head      ROM / Strength   AROM / PROM / Strength  AROM      AROM   AROM Assessment Site  Shoulder    Right/Left Shoulder  Left    Right Shoulder Extension  63 Degrees    Right Shoulder Flexion  148 Degrees    Right Shoulder ABduction  174 Degrees    Right Shoulder Internal Rotation  53 Degrees    Right Shoulder External Rotation  84 Degrees        LYMPHEDEMA/ONCOLOGY QUESTIONNAIRE - 03/18/18 1107      Type   Cancer Type  Left breast cancer      Surgeries   Lumpectomy Date  02/18/18    Sentinel Lymph Node Biopsy Date  02/18/18    Number Lymph Nodes Removed  5      Treatment   Active Chemotherapy Treatment  Yes    Date  03/08/18    Past Chemotherapy Treatment  No    Active Radiation Treatment  No    Past Radiation Treatment  No    Current Hormone Treatment  No    Past Hormone Therapy  No      What other symptoms do you have   Are you Having Heaviness or Tightness  Yes    Are you having Pain  No    Are you having pitting edema  No    Is it Hard or Difficult finding clothes that fit  No    Do you have infections  No    Is there Decreased scar mobility  Yes    Stemmer Sign  No      Right Upper Extremity Lymphedema   10 cm Proximal to Olecranon Process  26.9 cm    Olecranon Process  24.7 cm    10 cm Proximal to Ulnar Styloid Process  21.5 cm    Just Proximal to Ulnar Styloid Process  15.8  cm     Across Hand at [employer]  19.5 cm    At [place] of 2nd Digit  6.2 cm      Left Upper Extremity Lymphedema   10 cm Proximal to Olecranon Process  27.2 cm    Olecranon Process  24.7 cm    10 cm Proximal to Ulnar Styloid Process  21.1 cm    Just Proximal to Ulnar Styloid Process  15.4 cm    Across Hand at [employer]  18.2 cm    At [place] of 2nd Digit  5.9 cm        Quick Dash - 03/18/18 0001    Open a tight or new jar  Mild difficulty    Do heavy household chores (wash walls, wash floors)  No difficulty    Carry a shopping bag or briefcase  No difficulty    Wash your back  No difficulty    Use a knife to cut food  No difficulty    Recreational activities in which you take some force or impact through your arm, shoulder, or hand (golf, hammering, tennis)  No difficulty    During the past week, to what extent has your arm, shoulder or hand problem interfered with your normal social activities with family, friends, neighbors, or groups?  Not at all    During the past week, to what extent has your arm, shoulder or hand problem limited your work or other regular daily activities  Not at all    Arm, shoulder, or hand pain.  None    Tingling (pins and needles) in your arm, shoulder, or hand  Mild    Difficulty Sleeping  No difficulty    DASH Score  4.55 %                     PT Education - 03/18/18 1122    Education Details  Lymphedema risk; After Breast Cancer class    Person(s) Educated  Patient    Methods  Explanation    Comprehension  Verbalized understanding          PT Long Term Goals - 03/18/18 1132      PT LONG TERM GOAL #1   Title  Patient will demonstrate she has regained shoulder function and ROM post operatively compared to baseline.    Time  8    Period  Weeks    Status  Achieved            Plan - 03/18/18 1130    Clinical Impression Statement  Patient is doing very well s/p a left lumpectomy and SLNB. She is currently undergoing  chemotherapy and is tolerating that well. Her shoulder ROM and function is back to baseline and she plans to return to the gym this week. She has some mild axillary edema present which may benefit from compression foam and a compression bra so that was discussed and she has a bra she can use. There are no other needs for PT at this time but she plans to attend the After Breast Cancer class on 04/15/18 for lymphedema risk reduction education.    Rehab Potential  Excellent    PT Treatment/Interventions  ADLs/Self Care Home Management;Therapeutic exercise;Patient/family education    PT Next Visit Plan  D/C    PT Home Exercise Plan  Post op shoulder ROM HEP    Consulted and Agree with Plan of Care  Patient       Patient will  benefit from skilled therapeutic intervention in order to improve the following deficits and impairments:  Postural dysfunction, Decreased range of motion, Impaired UE functional use, Pain, Decreased knowledge of precautions  Visit Diagnosis: Malignant neoplasm of lower-inner quadrant of left breast in female, estrogen receptor positive ([facility])  Abnormal posture  Aftercare following surgery for neoplasm     Problem List Patient Active Problem List   Diagnosis Date Noted  . Port-A-Cath in place 03/15/2018  . Genetic testing 03/15/2018  . Family history of ovarian cancer   . Malignant neoplasm of lower-inner quadrant of left breast in female, estrogen receptor positive ([place]) 01/24/2018  . Hyperthyroidism, subclinical 02/03/2015  . Multiple thyroid nodules 02/03/2015  . Hypertensive urgency 04/05/2014  . Chest pain 04/05/2014  . Dyslipidemia 04/05/2014  . Tobacco abuse 04/05/2014   PHYSICAL THERAPY DISCHARGE SUMMARY  Visits from Start of Care: 2  Current functional level related to goals / functional outcomes: Goals met. See above for objective findings.   Remaining deficits: Mild edema present left axillary region   Education / Equipment: HEP and lymphedema  risk reduction education. Plan: Patient agrees to discharge.  Patient goals were met. Patient is being discharged due to meeting the stated rehab goals.  ?????         Annia Friendly, [place] 03/18/18 11:44 AM  [place], [place], [address] Phone: [phone]   Fax:  [phone]  Name: Margaret Hodges MRN: [id number] Date of Birth: 26-Jan-1946

## 2018-03-22 ENCOUNTER — Other Ambulatory Visit: Payer: Medicare Other

## 2018-03-22 ENCOUNTER — Inpatient Hospital Stay: Payer: Medicare Other

## 2018-03-22 VITALS — BP 134/85 | HR 73 | Temp 97.8°F | Resp 18 | Wt 164.4 lb

## 2018-03-22 DIAGNOSIS — Z170 Estrogen receptor positive status [ER+]: Secondary | ICD-10-CM | POA: Diagnosis not present

## 2018-03-22 DIAGNOSIS — C50312 Malignant neoplasm of lower-inner quadrant of left female breast: Secondary | ICD-10-CM | POA: Diagnosis not present

## 2018-03-22 DIAGNOSIS — Z720 Tobacco use: Secondary | ICD-10-CM

## 2018-03-22 DIAGNOSIS — Z95828 Presence of other vascular implants and grafts: Secondary | ICD-10-CM

## 2018-03-22 DIAGNOSIS — Z5112 Encounter for antineoplastic immunotherapy: Secondary | ICD-10-CM | POA: Diagnosis not present

## 2018-03-22 DIAGNOSIS — Z5111 Encounter for antineoplastic chemotherapy: Secondary | ICD-10-CM | POA: Diagnosis not present

## 2018-03-22 DIAGNOSIS — G8918 Other acute postprocedural pain: Secondary | ICD-10-CM | POA: Diagnosis not present

## 2018-03-22 DIAGNOSIS — R5383 Other fatigue: Secondary | ICD-10-CM | POA: Diagnosis not present

## 2018-03-22 LAB — COMPREHENSIVE METABOLIC PANEL
ALT: 19 U/L (ref 0–44)
AST: 12 U/L — AB (ref 15–41)
Albumin: 3.8 g/dL (ref 3.5–5.0)
Alkaline Phosphatase: 83 U/L (ref 38–126)
Anion gap: 7 (ref 5–15)
BUN: 14 mg/dL (ref 8–23)
CALCIUM: 9.7 mg/dL (ref 8.9–10.3)
CO2: 25 mmol/L (ref 22–32)
Chloride: 107 mmol/L (ref 98–111)
Creatinine, Ser: 0.76 mg/dL (ref 0.44–1.00)
GFR calc Af Amer: >60 mL/min (ref >60)
GFR calc non Af Amer: >60 mL/min (ref >60)
Glucose, Bld: 99 mg/dL (ref 70–99)
Potassium: 4.3 mmol/L (ref 3.5–5.1)
Sodium: 139 mmol/L (ref 135–145)
Total Bilirubin: 0.3 mg/dL (ref 0.3–1.2)
Total Protein: 7 g/dL (ref 6.5–8.1)

## 2018-03-22 LAB — CBC WITH DIFFERENTIAL/PLATELET
Abs Immature Granulocytes: 0.06 10*3/uL (ref 0.00–0.07)
Basophils Absolute: 0.1 10*3/uL (ref 0.0–0.1)
Basophils Relative: 1 %
EOS PCT: 4 %
Eosinophils Absolute: 0.4 10*3/uL (ref 0.0–0.5)
HCT: 39.3 % (ref 36.0–46.0)
Hemoglobin: 12.8 g/dL (ref 12.0–15.0)
Immature Granulocytes: 1 %
Lymphocytes Relative: 28 %
Lymphs Abs: 3.1 10*3/uL (ref 0.7–4.0)
MCH: 32.8 pg (ref 26.0–34.0)
MCHC: 32.6 g/dL (ref 30.0–36.0)
MCV: 100.8 fL — ABNORMAL HIGH (ref 80.0–100.0)
Monocytes Absolute: 0.6 10*3/uL (ref 0.1–1.0)
Monocytes Relative: 6 %
Neutro Abs: 6.8 10*3/uL (ref 1.7–7.7)
Neutrophils Relative %: 60 %
Platelets: 286 10*3/uL (ref 150–400)
RBC: 3.9 MIL/uL (ref 3.87–5.11)
RDW: 14.3 % (ref 11.5–15.5)
WBC: 11 10*3/uL — ABNORMAL HIGH (ref 4.0–10.5)
nRBC: 0 % (ref 0.0–0.2)

## 2018-03-22 MED ORDER — SODIUM CHLORIDE 0.9% FLUSH
10.0000 mL | Freq: Once | INTRAVENOUS | Status: AC
  Administered 2018-03-22: 10 mL
  Filled 2018-03-22: qty 10

## 2018-03-22 MED ORDER — HEPARIN SOD (PORK) LOCK FLUSH 100 UNIT/ML IV SOLN
500.0000 [IU] | Freq: Once | INTRAVENOUS | Status: AC | PRN
  Administered 2018-03-22: 500 [IU]
  Filled 2018-03-22: qty 5

## 2018-03-22 MED ORDER — SODIUM CHLORIDE 0.9 % IV SOLN
80.0000 mg/m2 | Freq: Once | INTRAVENOUS | Status: AC
  Administered 2018-03-22: 150 mg via INTRAVENOUS
  Filled 2018-03-22: qty 25

## 2018-03-22 MED ORDER — FAMOTIDINE IN NACL 20-0.9 MG/50ML-% IV SOLN
INTRAVENOUS | Status: AC
  Filled 2018-03-22: qty 50

## 2018-03-22 MED ORDER — DIPHENHYDRAMINE HCL 50 MG/ML IJ SOLN
INTRAMUSCULAR | Status: AC
  Filled 2018-03-22: qty 1

## 2018-03-22 MED ORDER — DIPHENHYDRAMINE HCL 50 MG/ML IJ SOLN
25.0000 mg | Freq: Once | INTRAMUSCULAR | Status: AC
  Administered 2018-03-22: 25 mg via INTRAVENOUS

## 2018-03-22 MED ORDER — FAMOTIDINE IN NACL 20-0.9 MG/50ML-% IV SOLN
20.0000 mg | Freq: Once | INTRAVENOUS | Status: AC
  Administered 2018-03-22: 20 mg via INTRAVENOUS

## 2018-03-22 MED ORDER — DEXAMETHASONE SODIUM PHOSPHATE 10 MG/ML IJ SOLN
INTRAMUSCULAR | Status: AC
  Filled 2018-03-22: qty 1

## 2018-03-22 MED ORDER — SODIUM CHLORIDE 0.9 % IV SOLN
Freq: Once | INTRAVENOUS | Status: AC
  Administered 2018-03-22: 11:00:00 via INTRAVENOUS
  Filled 2018-03-22: qty 250

## 2018-03-22 MED ORDER — SODIUM CHLORIDE 0.9% FLUSH
10.0000 mL | INTRAVENOUS | Status: DC | PRN
  Administered 2018-03-22: 10 mL
  Filled 2018-03-22: qty 10

## 2018-03-22 MED ORDER — DEXAMETHASONE SODIUM PHOSPHATE 10 MG/ML IJ SOLN
4.0000 mg | Freq: Once | INTRAMUSCULAR | Status: AC
  Administered 2018-03-22: 4 mg via INTRAVENOUS

## 2018-03-22 NOTE — Patient Instructions (Signed)
Cancer Center Discharge Instructions for Patients Receiving Chemotherapy  Today you received the following chemotherapy agents Paclitaxel (TAXOL).  To help prevent nausea and vomiting after your treatment, we encourage you to take your nausea medication as prescribed.  If you develop nausea and vomiting that is not controlled by your nausea medication, call the clinic.   BELOW ARE SYMPTOMS THAT SHOULD BE REPORTED IMMEDIATELY:  *FEVER GREATER THAN 100.5 F  *CHILLS WITH OR WITHOUT FEVER  NAUSEA AND VOMITING THAT IS NOT CONTROLLED WITH YOUR NAUSEA MEDICATION  *UNUSUAL SHORTNESS OF BREATH  *UNUSUAL BRUISING OR BLEEDING  TENDERNESS IN MOUTH AND THROAT WITH OR WITHOUT PRESENCE OF ULCERS  *URINARY PROBLEMS  *BOWEL PROBLEMS  UNUSUAL RASH Items with * indicate a potential emergency and should be followed up as soon as possible.  Feel free to call the clinic should you have any questions or concerns. The clinic phone number is [phone].  Please show the CHEMO ALERT CARD at check-in to the Emergency Department and triage nurse.   

## 2018-03-29 ENCOUNTER — Inpatient Hospital Stay: Payer: Medicare Other | Attending: Oncology

## 2018-03-29 ENCOUNTER — Inpatient Hospital Stay: Payer: Medicare Other

## 2018-03-29 ENCOUNTER — Other Ambulatory Visit: Payer: Medicare Other

## 2018-03-29 ENCOUNTER — Other Ambulatory Visit (HOSPITAL_COMMUNITY): Payer: Self-pay | Admitting: Surgery

## 2018-03-29 VITALS — BP 134/65 | HR 76 | Temp 98.2°F | Resp 18

## 2018-03-29 DIAGNOSIS — Z95828 Presence of other vascular implants and grafts: Secondary | ICD-10-CM

## 2018-03-29 DIAGNOSIS — G629 Polyneuropathy, unspecified: Secondary | ICD-10-CM | POA: Insufficient documentation

## 2018-03-29 DIAGNOSIS — C50411 Malignant neoplasm of upper-outer quadrant of right female breast: Secondary | ICD-10-CM

## 2018-03-29 DIAGNOSIS — F17210 Nicotine dependence, cigarettes, uncomplicated: Secondary | ICD-10-CM | POA: Insufficient documentation

## 2018-03-29 DIAGNOSIS — Z5111 Encounter for antineoplastic chemotherapy: Secondary | ICD-10-CM | POA: Diagnosis not present

## 2018-03-29 DIAGNOSIS — C50312 Malignant neoplasm of lower-inner quadrant of left female breast: Secondary | ICD-10-CM

## 2018-03-29 DIAGNOSIS — Z170 Estrogen receptor positive status [ER+]: Principal | ICD-10-CM

## 2018-03-29 DIAGNOSIS — I10 Essential (primary) hypertension: Secondary | ICD-10-CM | POA: Insufficient documentation

## 2018-03-29 DIAGNOSIS — Z5112 Encounter for antineoplastic immunotherapy: Secondary | ICD-10-CM | POA: Diagnosis not present

## 2018-03-29 DIAGNOSIS — Z90710 Acquired absence of both cervix and uterus: Secondary | ICD-10-CM | POA: Diagnosis not present

## 2018-03-29 DIAGNOSIS — Z79899 Other long term (current) drug therapy: Secondary | ICD-10-CM | POA: Diagnosis not present

## 2018-03-29 DIAGNOSIS — Z720 Tobacco use: Secondary | ICD-10-CM

## 2018-03-29 LAB — COMPREHENSIVE METABOLIC PANEL
ALT: 18 U/L (ref 0–44)
AST: 12 U/L — ABNORMAL LOW (ref 15–41)
Albumin: 3.7 g/dL (ref 3.5–5.0)
Alkaline Phosphatase: 93 U/L (ref 38–126)
Anion gap: 10 (ref 5–15)
BUN: 13 mg/dL (ref 8–23)
CHLORIDE: 108 mmol/L (ref 98–111)
CO2: 22 mmol/L (ref 22–32)
Calcium: 9.6 mg/dL (ref 8.9–10.3)
Creatinine, Ser: 0.73 mg/dL (ref 0.44–1.00)
GFR calc Af Amer: >60 mL/min (ref >60)
GFR calc non Af Amer: >60 mL/min (ref >60)
GLUCOSE: 100 mg/dL — AB (ref 70–99)
Potassium: 4.2 mmol/L (ref 3.5–5.1)
SODIUM: 140 mmol/L (ref 135–145)
Total Bilirubin: 0.3 mg/dL (ref 0.3–1.2)
Total Protein: 7.1 g/dL (ref 6.5–8.1)

## 2018-03-29 LAB — CBC WITH DIFFERENTIAL/PLATELET
Abs Immature Granulocytes: 0.05 10*3/uL (ref 0.00–0.07)
BASOS ABS: 0.1 10*3/uL (ref 0.0–0.1)
Basophils Relative: 1 %
Eosinophils Absolute: 0.2 10*3/uL (ref 0.0–0.5)
Eosinophils Relative: 2 %
HCT: 38.3 % (ref 36.0–46.0)
Hemoglobin: 12.7 g/dL (ref 12.0–15.0)
Immature Granulocytes: 1 %
Lymphocytes Relative: 27 %
Lymphs Abs: 2.5 10*3/uL (ref 0.7–4.0)
MCH: 33.3 pg (ref 26.0–34.0)
MCHC: 33.2 g/dL (ref 30.0–36.0)
MCV: 100.5 fL — ABNORMAL HIGH (ref 80.0–100.0)
Monocytes Absolute: 0.7 10*3/uL (ref 0.1–1.0)
Monocytes Relative: 7 %
Neutro Abs: 5.8 10*3/uL (ref 1.7–7.7)
Neutrophils Relative %: 62 %
PLATELETS: 327 10*3/uL (ref 150–400)
RBC: 3.81 MIL/uL — ABNORMAL LOW (ref 3.87–5.11)
RDW: 14 % (ref 11.5–15.5)
WBC: 9.2 10*3/uL (ref 4.0–10.5)
nRBC: 0 % (ref 0.0–0.2)

## 2018-03-29 MED ORDER — ACETAMINOPHEN 325 MG PO TABS
ORAL_TABLET | ORAL | Status: AC
  Filled 2018-03-29: qty 2

## 2018-03-29 MED ORDER — ACETAMINOPHEN 325 MG PO TABS
650.0000 mg | ORAL_TABLET | Freq: Once | ORAL | Status: AC
  Administered 2018-03-29: 650 mg via ORAL

## 2018-03-29 MED ORDER — HEPARIN SOD (PORK) LOCK FLUSH 100 UNIT/ML IV SOLN
500.0000 [IU] | Freq: Once | INTRAVENOUS | Status: AC | PRN
  Administered 2018-03-29: 500 [IU]
  Filled 2018-03-29: qty 5

## 2018-03-29 MED ORDER — DIPHENHYDRAMINE HCL 50 MG/ML IJ SOLN
INTRAMUSCULAR | Status: AC
  Filled 2018-03-29: qty 1

## 2018-03-29 MED ORDER — SODIUM CHLORIDE 0.9% FLUSH
10.0000 mL | INTRAVENOUS | Status: DC | PRN
  Administered 2018-03-29: 10 mL
  Filled 2018-03-29: qty 10

## 2018-03-29 MED ORDER — FAMOTIDINE IN NACL 20-0.9 MG/50ML-% IV SOLN
INTRAVENOUS | Status: AC
  Filled 2018-03-29: qty 50

## 2018-03-29 MED ORDER — DIPHENHYDRAMINE HCL 50 MG/ML IJ SOLN
25.0000 mg | Freq: Once | INTRAMUSCULAR | Status: AC
  Administered 2018-03-29: 25 mg via INTRAVENOUS

## 2018-03-29 MED ORDER — TRASTUZUMAB CHEMO 150 MG IV SOLR
450.0000 mg | Freq: Once | INTRAVENOUS | Status: AC
  Administered 2018-03-29: 450 mg via INTRAVENOUS
  Filled 2018-03-29: qty 21.43

## 2018-03-29 MED ORDER — FAMOTIDINE IN NACL 20-0.9 MG/50ML-% IV SOLN
20.0000 mg | Freq: Once | INTRAVENOUS | Status: AC
  Administered 2018-03-29: 20 mg via INTRAVENOUS

## 2018-03-29 MED ORDER — DIPHENHYDRAMINE HCL 25 MG PO CAPS: 25.0000 mg | ORAL_CAPSULE | Freq: Once | ORAL | Status: DC

## 2018-03-29 MED ORDER — SODIUM CHLORIDE 0.9 % IV SOLN
Freq: Once | INTRAVENOUS | Status: AC
  Administered 2018-03-29: 10:00:00 via INTRAVENOUS
  Filled 2018-03-29: qty 250

## 2018-03-29 MED ORDER — SODIUM CHLORIDE 0.9 % IV SOLN
80.0000 mg/m2 | Freq: Once | INTRAVENOUS | Status: AC
  Administered 2018-03-29: 150 mg via INTRAVENOUS
  Filled 2018-03-29: qty 25

## 2018-03-29 MED ORDER — DEXAMETHASONE SODIUM PHOSPHATE 10 MG/ML IJ SOLN
4.0000 mg | Freq: Once | INTRAMUSCULAR | Status: AC
  Administered 2018-03-29: 4 mg via INTRAVENOUS

## 2018-03-29 MED ORDER — DEXAMETHASONE SODIUM PHOSPHATE 10 MG/ML IJ SOLN
INTRAMUSCULAR | Status: AC
  Filled 2018-03-29: qty 1

## 2018-03-29 MED ORDER — DIPHENHYDRAMINE HCL 25 MG PO CAPS
ORAL_CAPSULE | ORAL | Status: AC
  Filled 2018-03-29: qty 1

## 2018-03-29 NOTE — Patient Instructions (Signed)
Cancer Center Discharge Instructions for Patients Receiving Chemotherapy  Today you received the following chemotherapy agents:  Herceptin and Taxol.  To help prevent nausea and vomiting after your treatment, we encourage you to take your nausea medication as directed.   If you develop nausea and vomiting that is not controlled by your nausea medication, call the clinic.   BELOW ARE SYMPTOMS THAT SHOULD BE REPORTED IMMEDIATELY:  *FEVER GREATER THAN 100.5 F  *CHILLS WITH OR WITHOUT FEVER  NAUSEA AND VOMITING THAT IS NOT CONTROLLED WITH YOUR NAUSEA MEDICATION  *UNUSUAL SHORTNESS OF BREATH  *UNUSUAL BRUISING OR BLEEDING  TENDERNESS IN MOUTH AND THROAT WITH OR WITHOUT PRESENCE OF ULCERS  *URINARY PROBLEMS  *BOWEL PROBLEMS  UNUSUAL RASH Items with * indicate a potential emergency and should be followed up as soon as possible.  Feel free to call the clinic should you have any questions or concerns. The clinic phone number is [phone].  Please show the CHEMO ALERT CARD at check-in to the Emergency Department and triage nurse.   

## 2018-04-01 ENCOUNTER — Other Ambulatory Visit (HOSPITAL_COMMUNITY): Payer: Self-pay | Admitting: Surgery

## 2018-04-01 NOTE — Progress Notes (Signed)
error 

## 2018-04-04 NOTE — Progress Notes (Signed)
[place]  [phone] [phone]     ID: OLAYINKA GATHERS DOB: 1945-07-22  MR#: [id number]  KDT#:[id number]  Patient Care Team: [name], MD as PCP - General (Family Medicine) Margaret Skates, MD as Consulting Physician (General Surgery) Hayzlee Mcsorley, Virgie Dad, MD as Consulting Physician (Oncology) Kyung Rudd, MD as Consulting Physician (Radiation Oncology) Margaret Kenner, MD (Dermatology) Margaret Dresser, MD as Consulting Physician (Cardiology) OTHER MD:  CHIEF COMPLAINT: Estrogen receptor positive breast cancer  CURRENT TREATMENT: Paclitaxel, trastuzumab   HISTORY OF CURRENT ILLNESS: From the original intake note:  Margaret Hodges had routine screening mammography on 01/03/2018 showing a possible abnormality in the left breast. She underwent bilateral diagnostic mammography with tomography and left breast ultrasonography at [facility] on 01/15/2018 showing: Breast density category B; a 1.3 cm round solid mass with an indistinct margin in the left breast at 7 o'clock posterior 8 cm from the nipple. No significant abnormalities found in the left axilla.  Accordingly on 01/16/2018 she proceeded to biopsy of the left breast area in question. The pathology from this procedure showed [phone]): Invasive ductal carcinoma, grade III. Prognostic indicators significant for: estrogen receptor, 60% positive, with weak staining intensity; progesterone receptor negative, Proliferation marker Ki67 at 80%. HER2 positive by immunohistochemistry, 3+.  The patient's subsequent history is as detailed below.   INTERVAL HISTORY: Margaret Hodges returns today for follow-up and treatment of her estrogen receptor positive breast cancer.   The patient continues on paclitaxel weekly. Today is day 1 cycle 5 of 12 planned cycles. She tolerates this well.  She also receives trastuzumab, every 21 days, with her most recent dose 03/29/2018. She tolerates this well and without any  noticeable side effects.    Her last echocardiogram on 02/06/18 showed an ejection fraction in the 60% - 65% range.  Since her last visit here on 03/15/2018, she has not undergone any recent studies.   REVIEW OF SYSTEMS: Varnell has begun to lose her hair; her scalp is itchy. She has wigs and hats prepared for when she fully loses her hair. She states that she has myalgia throughout her legs every afternoon. She is fairly active in the morning. Both of her 5th digits on both hands have neuropathy, which comes and goes. She feels fatigued in the afternoon; she goes to bed around 8-9 o'clock when before she would go to bed around 11-12 before. She had a mild upset stomach. This weekend she will be taking down her holiday decorations. The patient denies unusual headaches, visual changes, vomiting, or dizziness. There has been no unusual cough, phlegm production, or pleurisy. This been no change in bowel or bladder habits. The patient denies unexplained weight loss, bleeding, rash, or fever. A detailed review of systems was otherwise noncontributory.    PAST MEDICAL HISTORY: Past Medical History:  Diagnosis Date  . Anginal pain ([place])    with admission to hospital due to high blood pressure  . Complication of anesthesia   . Family history of ovarian cancer   . GERD (gastroesophageal reflux disease)   . Guillain Barr syndrome (Margaret Hodges) 1959  . Guillain Barr syndrome ([place]) 1959  . Guillain Barr syndrome ([place]) 1959  . Headache    sinus headaches  . History of hiatal hernia    was told pt. had one, but no testing  . Hypertensive urgency 04/05/2014  . Malignant neoplasm of lower-inner quadrant of left breast in female, estrogen receptor positive (Margaret Hodges) 01/24/2018  . Neuromuscular disorder (Margaret Hodges)  Guillian- Barre Syndrome  . Pneumonia    hx. of walking pneumonia  . PONV (postoperative nausea and vomiting)     PAST SURGICAL HISTORY: Past Surgical History:  Procedure Laterality Date  . ABDOMINAL  HYSTERECTOMY     Total  . Bladder lift  15 years ago,  Uterus prolapse causing intestinal damage and surgery    . BREAST LUMPECTOMY WITH RADIOACTIVE SEED AND SENTINEL LYMPH NODE BIOPSY Left 02/18/2018   Procedure: LEFT BREAST LUMPECTOMY WITH RADIOACTIVE SEED AND LEFT AXILLARY DEEP SENTINEL LYMPH NODE BIOPSY, INJECT BLUE DYE LEFT BREAST;  Surgeon: Margaret Skates, MD;  Location: [place];  Service: General;  Laterality: Left;  . PORTACATH PLACEMENT Right 02/18/2018   Procedure: INSERTION PORT-A-CATH;  Surgeon: Margaret Skates, MD;  Location: [place];  Service: General;  Laterality: Right;  . Right torn meniscus repair x 2    . THYROID LOBECTOMY N/A 02/04/2015   Procedure: TOTAL THYROIDECTOMY;  Surgeon: Margaret Gemma, MD;  Location: [facility];  Service: General;  Laterality: N/A;  . TONSILLECTOMY    . TUMOR REMOVAL    . Tumors on arms removed,  Tumor on gum removed,  Tumor removed from inside ear      FAMILY HISTORY Family History  Problem Relation Age of Onset  . Ovarian cancer Sister   . Lung cancer Sister   . Throat cancer Brother   . Brain cancer Sister    She notes that her father died from old age at age 81. Patients' mother died from multiple comorbidities at age 39. The patient has 5 brothers and 6 sisters. Patients' sister had ovarian cancer at age 41 and lung cancer at 73, brother with throat cancer at age 57, and sister with brain cancer at age 43.    GYNECOLOGIC HISTORY:  No LMP recorded. Patient has had a hysterectomy. 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 Her husband was a Librarian, academic at [employer]. Her son, Margaret Hodges manages a call center for medical supplies. Her daughter, Margaret Hodges, is Patient Care surgical  coordinator at [employer] clinic.  The patient has 2 grandchildren. She attends [employer].     ADVANCED DIRECTIVES: Her [place] is her daughter, [name] and can be reached at [phone].     HEALTH MAINTENANCE: Social History   Tobacco Use  . Smoking status: Current Every Day Smoker    Packs/day: 1.50    Types: Cigarettes  . Smokeless tobacco: Never Used  Substance Use Topics  . Alcohol use: No  . Drug use: No     Colonoscopy: Yes through [facility]  PAP:   Bone density:    Allergies  Allergen Reactions  . Allegra [Fexofenadine] Hives  . Clindamycin/Lincomycin Hives and Diarrhea  . Influenza A (H1n1) [place] Vac Other (See Comments)    Guillian Barre Syndrome  . Keflex [Cephalexin] Hives  . Penicillins Swelling    Has patient had a PCN reaction causing immediate rash, facial/tongue/throat swelling, SOB or lightheadedness with hypotension: No Has patient had a PCN reaction causing severe rash involving mucus membranes or skin necrosis: No Has patient had a PCN reaction that required hospitalization No Has patient had a PCN reaction occurring within the last 10 years: No If all of the  above answers are "NO", then may proceed with Cephalosporin use.   . Prednisone Other (See Comments)    "heart attack symptoms" but even had heart cath & had no cardiac disease  . Protonix [Pantoprazole Sodium] Diarrhea    Tolerates Prilosec  . Statins Other (See Comments)    myalgias    Current Outpatient Medications  Medication Sig Dispense Refill  . amLODipine (NORVASC) 5 MG tablet Take 1 tablet (5 mg total) by mouth daily. 30 tablet 0  . Cholecalciferol (VITAMIN D) 2000 UNITS tablet Take 2,000 Units by mouth daily.    . diclofenac sodium (VOLTAREN) 1 % GEL Apply 4 g topically 4 (four) times daily as needed (arthritis pain).     Marland Kitchen HYDROcodone-acetaminophen (NORCO) 5-325 MG tablet Take 1-2 tablets by mouth every 6 (six) hours as needed for moderate pain  or severe pain. 30 tablet 0  . hyoscyamine (LEVBID) 0.375 MG 12 hr tablet Take 0.375 mg by mouth every 12 (twelve) hours as needed (blaader spasms).    Marland Kitchen levothyroxine (SYNTHROID) 88 MCG tablet Take 1 tablet (88 mcg total) by mouth daily before breakfast. 30 tablet 3  . lidocaine-prilocaine (EMLA) cream Apply to affected area once 30 g 3  . omeprazole (PRILOSEC) 20 MG capsule Take 20 mg by mouth daily.    Marland Kitchen Phenylephrine-Bromphen-DM (PHENYLEPHRINE COMPLEX PO) Take 5 mg by mouth as directed. Takes every morning and may takes up to 2 additional tablets if needed for sinus congestion during the day    . prochlorperazine (COMPAZINE) 10 MG tablet Take 1 tablet (10 mg total) by mouth every 6 (six) hours as needed (Nausea or vomiting). 30 tablet 1  . saccharomyces boulardii (FLORASTOR) 250 MG capsule Take 250 mg by mouth daily as needed (only when on antibiotic).     No current facility-administered medications for this visit.     OBJECTIVE: Middle-aged white woman in no acute distress  Vitals:   04/05/18 0838  BP: 133/77  Pulse: 83  Resp: 18  Temp: 97.8 F (36.6 C)  SpO2: 99%     Body mass index is 25.21 kg/m.   Wt Readings from Last 3 Encounters:  04/05/18 165 lb 12.8 oz (75.2 kg)  03/22/18 164 lb 6.4 oz (74.6 kg)  03/15/18 164 lb 3.2 oz (74.5 kg)      ECOG FS:1 - Symptomatic but completely ambulatory  Sclerae unicteric, EOMs intact Oropharynx clear and moist No cervical or supraclavicular adenopathy Lungs no rales or rhonchi Heart regular rate and rhythm Abd soft, nontender, positive bowel sounds MSK no focal spinal tenderness, no upper extremity lymphedema Neuro: nonfocal, well oriented, appropriate affect Breasts: The right breast is benign.  The left breast has undergone lumpectomy.  There is no evidence of local recurrence.  Both axillae are benign.  LAB RESULTS:  CMP     Component Value Date/Time   NA 140 03/29/2018 0901   K 4.2 03/29/2018 0901   CL 108 03/29/2018  0901   CO2 22 03/29/2018 0901   GLUCOSE 100 (H) 03/29/2018 0901   BUN 13 03/29/2018 0901   CREATININE 0.73 03/29/2018 0901   CREATININE 0.95 01/30/2018 1205   CALCIUM 9.6 03/29/2018 0901   PROT 7.1 03/29/2018 0901   ALBUMIN 3.7 03/29/2018 0901   AST 12 (L) 03/29/2018 0901   AST 13 (L) 01/30/2018 1205   ALT 18 03/29/2018 0901   ALT 17 01/30/2018 1205   ALKPHOS 93 03/29/2018 0901   BILITOT 0.3 03/29/2018 0901   BILITOT <0.2 (L)  01/30/2018 1205   GFRNONAA >60 03/29/2018 0901   GFRNONAA 59 (L) 01/30/2018 1205   GFRAA >60 03/29/2018 0901   GFRAA >60 01/30/2018 1205    Lab Results  Component Value Date   TOTALPROTELP 7.5 05/08/2017    No results found for: KPAFRELGTCHN, LAMBDASER, KAPLAMBRATIO  Lab Results  Component Value Date   WBC 9.2 03/29/2018   NEUTROABS 5.8 03/29/2018   HGB 12.7 03/29/2018   HCT 38.3 03/29/2018   MCV 100.5 (H) 03/29/2018   PLT 327 03/29/2018    _0 @  No results found for: LABCA2  No components found for: UORVIF537  No results for input(s): INR in the last 168 hours.  No results found for: LABCA2  No results found for: HKF276  No results found for: DYJ092  No results found for: HVF473  No results found for: CA2729  No components found for: HGQUANT  No results found for: CEA1 / No results found for: CEA1   No results found for: AFPTUMOR  No results found for: CHROMOGRNA  No results found for: PSA1  No visits with results within 3 Day(s) from this visit.  Latest known visit with results is:  Appointment on 03/29/2018  Component Date Value Ref Range Status  . WBC 03/29/2018 9.2  4.0 - 10.5 K/uL Final  . RBC 03/29/2018 3.81* 3.87 - 5.11 MIL/uL Final  . Hemoglobin 03/29/2018 12.7  12.0 - 15.0 g/dL Final  . HCT 03/29/2018 38.3  36.0 - 46.0 % Final  . MCV 03/29/2018 100.5* 80.0 - 100.0 fL Final  . MCH 03/29/2018 33.3  26.0 - 34.0 pg Final  . MCHC 03/29/2018 33.2  30.0 - 36.0 g/dL Final  . RDW 03/29/2018 14.0  11.5 -  15.5 % Final  . Platelets 03/29/2018 327  150 - 400 K/uL Final  . nRBC 03/29/2018 0.0  0.0 - 0.2 % Final  . Neutrophils Relative % 03/29/2018 62  % Final  . Neutro Abs 03/29/2018 5.8  1.7 - 7.7 K/uL Final  . Lymphocytes Relative 03/29/2018 27  % Final  . Lymphs Abs 03/29/2018 2.5  0.7 - 4.0 K/uL Final  . Monocytes Relative 03/29/2018 7  % Final  . Monocytes Absolute 03/29/2018 0.7  0.1 - 1.0 K/uL Final  . Eosinophils Relative 03/29/2018 2  % Final  . Eosinophils Absolute 03/29/2018 0.2  0.0 - 0.5 K/uL Final  . Basophils Relative 03/29/2018 1  % Final  . Basophils Absolute 03/29/2018 0.1  0.0 - 0.1 K/uL Final  . Immature Granulocytes 03/29/2018 1  % Final  . Abs Immature Granulocytes 03/29/2018 0.05  0.00 - 0.07 K/uL Final   Performed at [facility], [place] [address] [place], [place] [address]  . Sodium 03/29/2018 140  135 - 145 mmol/L Final  . Potassium 03/29/2018 4.2  3.5 - 5.1 mmol/L Final  . Chloride 03/29/2018 108  98 - 111 mmol/L Final  . CO2 03/29/2018 22  22 - 32 mmol/L Final  . Glucose, Bld 03/29/2018 100* 70 - 99 mg/dL Final  . BUN 03/29/2018 13  8 - 23 mg/dL Final  . Creatinine, Ser 03/29/2018 0.73  0.44 - 1.00 mg/dL Final  . Calcium 03/29/2018 9.6  8.9 - 10.3 mg/dL Final  . Total Protein 03/29/2018 7.1  6.5 - 8.1 g/dL Final  . Albumin 03/29/2018 3.7  3.5 - 5.0 g/dL Final  . AST 03/29/2018 12* 15 - 41 U/L Final  . ALT 03/29/2018 18  0 - 44 U/L Final  .  Alkaline Phosphatase 03/29/2018 93  38 - 126 U/L Final  . Total Bilirubin 03/29/2018 0.3  0.3 - 1.2 mg/dL Final  . GFR calc non Af Amer 03/29/2018 >60  >60 mL/min Final  . GFR calc Af Amer 03/29/2018 >60  >60 mL/min Final  . Anion gap 03/29/2018 10  5 - 15 Final   Performed at [facility], [place] [address] [place], [place] [address]    (this displays the last labs from the last 3 days)  Lab Results  Component Value Date   TOTALPROTELP 7.5 05/08/2017   (this displays  SPEP labs)  No results found for: KPAFRELGTCHN, LAMBDASER, KAPLAMBRATIO (kappa/lambda light chains)  No results found for: HGBA, HGBA2QUANT, HGBFQUANT, HGBSQUAN (Hemoglobinopathy evaluation)   Lab Results  Component Value Date   LDH 171 05/08/2017    No results found for: IRON, TIBC, IRONPCTSAT (Iron and TIBC)  No results found for: FERRITIN  Urinalysis    Component Value Date/Time   COLORURINE YELLOW 04/04/2014 2112   APPEARANCEUR CLEAR 04/04/2014 2112   LABSPEC 1.017 04/04/2014 2112   PHURINE 6.0 04/04/2014 2112   GLUCOSEU NEGATIVE 04/04/2014 2112   HGBUR NEGATIVE 04/04/2014 2112   BILIRUBINUR NEGATIVE 04/04/2014 2112   [place] NEGATIVE 04/04/2014 2112   PROTEINUR NEGATIVE 04/04/2014 2112   UROBILINOGEN 0.2 04/04/2014 2112   NITRITE NEGATIVE 04/04/2014 2112   LEUKOCYTESUR NEGATIVE 04/04/2014 2112     STUDIES:  No results found.    ELIGIBLE FOR AVAILABLE RESEARCH PROTOCOL: no   ASSESSMENT: 73 y.o. [place] woman status post left breast lower inner quadrant biopsy 01/16/2018 for a clinical T1c N0, stage IA invasive ductal carcinoma, grade 3, estrogen receptor weakly positive, progesterone receptor negative, with an MIB-1 of 80%, and HER-2 amplified  (1) genetics 03/06/2018 showed no deleterious mutations  (2) status post left lumpectomy and sentinel lymph node sampling 02/18/2018 for a pT2 pN0, stage IIA invasive ductal carcinoma, grade 3, with negative margins.  (a) a total of 5 lymph nodes were removed  (3) adjuvant chemo immunotherapy consisting of paclitaxel/trastuzumab weekly for 12 weeks, started 03/08/2018.  (4) trastuzumab to be continued to complete a year  (a) echocardiogram 02/06/2018 shows an ejection fraction in the 60-65% range  (5) adjuvant radiation as appropriate  (6) antiestrogens to follow at the completion of local treatment.  (7) tobacco abuse disorder: The patient has been strongly advised to discontinue smoking   PLAN: Priseis is  doing quite well with her chemo immunotherapy and we are proceeding with cycle 5 of her Taxol today.  She is beginning to lose her hair and I suspect by next week she will be bald.  She tells me she has wakes and had to deal with that  She keeps a very positive attitude and is trying to remain as active as possible.  I am not sure why she is having some leg pain in the afternoons.  She does better with activity and that usually is a sign that the pain is due to arthritis.  So far however there has been no evidence of neuropathy even though she has a little bit of funny feeling in both her pinkies at times.  She tells me she used to get B12 shots regularly through Dr. Chriss Czar office.  I am going to obtain some B12 studies today to see that something we need to keep up with  Otherwise she will return next week for her sixth dose.  I will see her 2 weeks from now.  She will need an echocardiogram in mid February  She knows to call for any other issues that may develop before then.   Shavar Gorka, Virgie Dad, MD  04/05/18 9:10 AM Medical Oncology and Hematology [facility] [address] [place], [place] [address] Tel. [phone]    Fax. [phone]   I, Jacqualyn Posey am acting as a Education administrator for Chauncey Cruel, MD.   I, Lurline Del MD, have reviewed the above documentation for accuracy and completeness, and I agree with the above.

## 2018-04-05 ENCOUNTER — Inpatient Hospital Stay: Payer: Medicare Other

## 2018-04-05 ENCOUNTER — Inpatient Hospital Stay (HOSPITAL_BASED_OUTPATIENT_CLINIC_OR_DEPARTMENT_OTHER): Payer: Medicare Other | Admitting: Oncology

## 2018-04-05 ENCOUNTER — Other Ambulatory Visit: Payer: Medicare Other

## 2018-04-05 VITALS — BP 133/77 | HR 83 | Temp 97.8°F | Resp 18 | Ht 68.0 in | Wt 165.8 lb

## 2018-04-05 DIAGNOSIS — Z79899 Other long term (current) drug therapy: Secondary | ICD-10-CM

## 2018-04-05 DIAGNOSIS — G629 Polyneuropathy, unspecified: Secondary | ICD-10-CM

## 2018-04-05 DIAGNOSIS — I10 Essential (primary) hypertension: Secondary | ICD-10-CM | POA: Diagnosis not present

## 2018-04-05 DIAGNOSIS — Z95828 Presence of other vascular implants and grafts: Secondary | ICD-10-CM

## 2018-04-05 DIAGNOSIS — Z90710 Acquired absence of both cervix and uterus: Secondary | ICD-10-CM | POA: Diagnosis not present

## 2018-04-05 DIAGNOSIS — Z170 Estrogen receptor positive status [ER+]: Principal | ICD-10-CM

## 2018-04-05 DIAGNOSIS — C50312 Malignant neoplasm of lower-inner quadrant of left female breast: Secondary | ICD-10-CM | POA: Diagnosis not present

## 2018-04-05 DIAGNOSIS — E538 Deficiency of other specified B group vitamins: Secondary | ICD-10-CM | POA: Insufficient documentation

## 2018-04-05 DIAGNOSIS — Z5112 Encounter for antineoplastic immunotherapy: Secondary | ICD-10-CM | POA: Diagnosis not present

## 2018-04-05 DIAGNOSIS — F17210 Nicotine dependence, cigarettes, uncomplicated: Secondary | ICD-10-CM | POA: Diagnosis not present

## 2018-04-05 DIAGNOSIS — Z720 Tobacco use: Secondary | ICD-10-CM

## 2018-04-05 DIAGNOSIS — Z5111 Encounter for antineoplastic chemotherapy: Secondary | ICD-10-CM | POA: Diagnosis not present

## 2018-04-05 LAB — CBC WITH DIFFERENTIAL/PLATELET
Abs Immature Granulocytes: 0.08 10*3/uL — ABNORMAL HIGH (ref 0.00–0.07)
Basophils Absolute: 0 10*3/uL (ref 0.0–0.1)
Basophils Relative: 0 %
Eosinophils Absolute: 0.2 10*3/uL (ref 0.0–0.5)
Eosinophils Relative: 2 %
HCT: 38.8 % (ref 36.0–46.0)
Hemoglobin: 12.8 g/dL (ref 12.0–15.0)
Immature Granulocytes: 1 %
Lymphocytes Relative: 24 %
Lymphs Abs: 2.7 10*3/uL (ref 0.7–4.0)
MCH: 33.6 pg (ref 26.0–34.0)
MCHC: 33 g/dL (ref 30.0–36.0)
MCV: 101.8 fL — ABNORMAL HIGH (ref 80.0–100.0)
Monocytes Absolute: 0.7 10*3/uL (ref 0.1–1.0)
Monocytes Relative: 6 %
NRBC: 0 % (ref 0.0–0.2)
Neutro Abs: 7.5 10*3/uL (ref 1.7–7.7)
Neutrophils Relative %: 67 %
Platelets: 341 10*3/uL (ref 150–400)
RBC: 3.81 MIL/uL — AB (ref 3.87–5.11)
RDW: 14.5 % (ref 11.5–15.5)
WBC: 11.3 10*3/uL — AB (ref 4.0–10.5)

## 2018-04-05 LAB — COMPREHENSIVE METABOLIC PANEL
ALK PHOS: 97 U/L (ref 38–126)
ALT: 22 U/L (ref 0–44)
AST: 13 U/L — AB (ref 15–41)
Albumin: 3.7 g/dL (ref 3.5–5.0)
Anion gap: 9 (ref 5–15)
BUN: 13 mg/dL (ref 8–23)
CO2: 22 mmol/L (ref 22–32)
CREATININE: 0.74 mg/dL (ref 0.44–1.00)
Calcium: 9.3 mg/dL (ref 8.9–10.3)
Chloride: 109 mmol/L (ref 98–111)
GFR calc Af Amer: >60 mL/min (ref >60)
GFR calc non Af Amer: >60 mL/min (ref >60)
Glucose, Bld: 116 mg/dL — ABNORMAL HIGH (ref 70–99)
Potassium: 4.3 mmol/L (ref 3.5–5.1)
Sodium: 140 mmol/L (ref 135–145)
Total Bilirubin: 0.3 mg/dL (ref 0.3–1.2)
Total Protein: 6.8 g/dL (ref 6.5–8.1)

## 2018-04-05 MED ORDER — HEPARIN SOD (PORK) LOCK FLUSH 100 UNIT/ML IV SOLN
500.0000 [IU] | Freq: Once | INTRAVENOUS | Status: AC | PRN
  Administered 2018-04-05: 500 [IU]
  Filled 2018-04-05: qty 5

## 2018-04-05 MED ORDER — SODIUM CHLORIDE 0.9% FLUSH
10.0000 mL | INTRAVENOUS | Status: DC | PRN
  Administered 2018-04-05: 10 mL
  Filled 2018-04-05: qty 10

## 2018-04-05 MED ORDER — FAMOTIDINE IN NACL 20-0.9 MG/50ML-% IV SOLN
20.0000 mg | Freq: Once | INTRAVENOUS | Status: AC
  Administered 2018-04-05: 20 mg via INTRAVENOUS

## 2018-04-05 MED ORDER — SODIUM CHLORIDE 0.9 % IV SOLN
Freq: Once | INTRAVENOUS | Status: AC
  Administered 2018-04-05: 11:00:00 via INTRAVENOUS
  Filled 2018-04-05: qty 250

## 2018-04-05 MED ORDER — SODIUM CHLORIDE 0.9 % IV SOLN
80.0000 mg/m2 | Freq: Once | INTRAVENOUS | Status: AC
  Administered 2018-04-05: 150 mg via INTRAVENOUS
  Filled 2018-04-05: qty 25

## 2018-04-05 MED ORDER — DIPHENHYDRAMINE HCL 50 MG/ML IJ SOLN
25.0000 mg | Freq: Once | INTRAMUSCULAR | Status: AC
  Administered 2018-04-05: 25 mg via INTRAVENOUS

## 2018-04-05 MED ORDER — DIPHENHYDRAMINE HCL 50 MG/ML IJ SOLN
INTRAMUSCULAR | Status: AC
  Filled 2018-04-05: qty 1

## 2018-04-05 MED ORDER — DEXAMETHASONE SODIUM PHOSPHATE 10 MG/ML IJ SOLN
INTRAMUSCULAR | Status: AC
  Filled 2018-04-05: qty 1

## 2018-04-05 MED ORDER — DEXAMETHASONE SODIUM PHOSPHATE 10 MG/ML IJ SOLN
4.0000 mg | Freq: Once | INTRAMUSCULAR | Status: AC
  Administered 2018-04-05: 4 mg via INTRAVENOUS

## 2018-04-05 MED ORDER — FAMOTIDINE IN NACL 20-0.9 MG/50ML-% IV SOLN
INTRAVENOUS | Status: AC
  Filled 2018-04-05: qty 50

## 2018-04-05 NOTE — Patient Instructions (Signed)
[facility] Discharge Instructions for Patients Receiving Chemotherapy  Today you received the following chemotherapy agents Paclitaxel (TAXOL).  To help prevent nausea and vomiting after your treatment, we encourage you to take your nausea medication as prescribed.  If you develop nausea and vomiting that is not controlled by your nausea medication, call the clinic.   BELOW ARE SYMPTOMS THAT SHOULD BE REPORTED IMMEDIATELY:  *FEVER GREATER THAN 100.5 F  *CHILLS WITH OR WITHOUT FEVER  NAUSEA AND VOMITING THAT IS NOT CONTROLLED WITH YOUR NAUSEA MEDICATION  *UNUSUAL SHORTNESS OF BREATH  *UNUSUAL BRUISING OR BLEEDING  TENDERNESS IN MOUTH AND THROAT WITH OR WITHOUT PRESENCE OF ULCERS  *URINARY PROBLEMS  *BOWEL PROBLEMS  UNUSUAL RASH Items with * indicate a potential emergency and should be followed up as soon as possible.  Feel free to call the clinic should you have any questions or concerns. The clinic phone number is [phone].  Please show the CHEMO ALERT CARD at check-in to the Emergency Department and triage nurse.   

## 2018-04-12 ENCOUNTER — Telehealth: Payer: Self-pay | Admitting: Adult Health

## 2018-04-12 ENCOUNTER — Inpatient Hospital Stay: Payer: Medicare Other

## 2018-04-12 ENCOUNTER — Inpatient Hospital Stay (HOSPITAL_BASED_OUTPATIENT_CLINIC_OR_DEPARTMENT_OTHER): Payer: Medicare Other | Admitting: Adult Health

## 2018-04-12 ENCOUNTER — Encounter: Payer: Self-pay | Admitting: Adult Health

## 2018-04-12 VITALS — BP 128/79 | HR 75 | Temp 97.6°F | Resp 18 | Ht 68.0 in | Wt 167.2 lb

## 2018-04-12 DIAGNOSIS — Z79899 Other long term (current) drug therapy: Secondary | ICD-10-CM

## 2018-04-12 DIAGNOSIS — F17210 Nicotine dependence, cigarettes, uncomplicated: Secondary | ICD-10-CM

## 2018-04-12 DIAGNOSIS — G629 Polyneuropathy, unspecified: Secondary | ICD-10-CM | POA: Diagnosis not present

## 2018-04-12 DIAGNOSIS — Z720 Tobacco use: Secondary | ICD-10-CM

## 2018-04-12 DIAGNOSIS — C50312 Malignant neoplasm of lower-inner quadrant of left female breast: Secondary | ICD-10-CM

## 2018-04-12 DIAGNOSIS — Z5111 Encounter for antineoplastic chemotherapy: Secondary | ICD-10-CM | POA: Diagnosis not present

## 2018-04-12 DIAGNOSIS — Z90710 Acquired absence of both cervix and uterus: Secondary | ICD-10-CM

## 2018-04-12 DIAGNOSIS — I10 Essential (primary) hypertension: Secondary | ICD-10-CM | POA: Diagnosis not present

## 2018-04-12 DIAGNOSIS — Z170 Estrogen receptor positive status [ER+]: Secondary | ICD-10-CM | POA: Diagnosis not present

## 2018-04-12 DIAGNOSIS — Z95828 Presence of other vascular implants and grafts: Secondary | ICD-10-CM

## 2018-04-12 DIAGNOSIS — Z5112 Encounter for antineoplastic immunotherapy: Secondary | ICD-10-CM | POA: Diagnosis not present

## 2018-04-12 LAB — CBC WITH DIFFERENTIAL/PLATELET
Abs Immature Granulocytes: 0.06 10*3/uL (ref 0.00–0.07)
Basophils Absolute: 0.1 10*3/uL (ref 0.0–0.1)
Basophils Relative: 1 %
Eosinophils Absolute: 0.2 10*3/uL (ref 0.0–0.5)
Eosinophils Relative: 2 %
HCT: 37.8 % (ref 36.0–46.0)
Hemoglobin: 12.8 g/dL (ref 12.0–15.0)
Immature Granulocytes: 1 %
Lymphocytes Relative: 28 %
Lymphs Abs: 2.6 10*3/uL (ref 0.7–4.0)
MCH: 33.9 pg (ref 26.0–34.0)
MCHC: 33.9 g/dL (ref 30.0–36.0)
MCV: 100 fL (ref 80.0–100.0)
Monocytes Absolute: 0.6 10*3/uL (ref 0.1–1.0)
Monocytes Relative: 7 %
Neutro Abs: 5.9 10*3/uL (ref 1.7–7.7)
Neutrophils Relative %: 61 %
Platelets: 312 10*3/uL (ref 150–400)
RBC: 3.78 MIL/uL — ABNORMAL LOW (ref 3.87–5.11)
RDW: 14.4 % (ref 11.5–15.5)
WBC: 9.4 10*3/uL (ref 4.0–10.5)
nRBC: 0 % (ref 0.0–0.2)

## 2018-04-12 LAB — COMPREHENSIVE METABOLIC PANEL
ALBUMIN: 3.9 g/dL (ref 3.5–5.0)
ALT: 22 U/L (ref 0–44)
AST: 13 U/L — ABNORMAL LOW (ref 15–41)
Alkaline Phosphatase: 90 U/L (ref 38–126)
Anion gap: 10 (ref 5–15)
BILIRUBIN TOTAL: 0.4 mg/dL (ref 0.3–1.2)
BUN: 16 mg/dL (ref 8–23)
CO2: 23 mmol/L (ref 22–32)
Calcium: 9.9 mg/dL (ref 8.9–10.3)
Chloride: 106 mmol/L (ref 98–111)
Creatinine, Ser: 0.75 mg/dL (ref 0.44–1.00)
GFR calc Af Amer: >60 mL/min (ref >60)
GFR calc non Af Amer: >60 mL/min (ref >60)
GLUCOSE: 99 mg/dL (ref 70–99)
Potassium: 4.4 mmol/L (ref 3.5–5.1)
Sodium: 139 mmol/L (ref 135–145)
TOTAL PROTEIN: 7.1 g/dL (ref 6.5–8.1)

## 2018-04-12 LAB — VITAMIN B12: Vitamin B-12: 325 pg/mL (ref 180–914)

## 2018-04-12 MED ORDER — DIPHENHYDRAMINE HCL 50 MG/ML IJ SOLN
25.0000 mg | Freq: Once | INTRAMUSCULAR | Status: AC
  Administered 2018-04-12: 25 mg via INTRAVENOUS

## 2018-04-12 MED ORDER — SODIUM CHLORIDE 0.9 % IV SOLN
80.0000 mg/m2 | Freq: Once | INTRAVENOUS | Status: AC
  Administered 2018-04-12: 150 mg via INTRAVENOUS
  Filled 2018-04-12: qty 25

## 2018-04-12 MED ORDER — SODIUM CHLORIDE 0.9% FLUSH
10.0000 mL | INTRAVENOUS | Status: DC | PRN
  Administered 2018-04-12: 10 mL
  Filled 2018-04-12: qty 10

## 2018-04-12 MED ORDER — FAMOTIDINE IN NACL 20-0.9 MG/50ML-% IV SOLN
20.0000 mg | Freq: Once | INTRAVENOUS | Status: AC
  Administered 2018-04-12: 20 mg via INTRAVENOUS

## 2018-04-12 MED ORDER — DIPHENHYDRAMINE HCL 50 MG/ML IJ SOLN
INTRAMUSCULAR | Status: AC
  Filled 2018-04-12: qty 1

## 2018-04-12 MED ORDER — DEXAMETHASONE SODIUM PHOSPHATE 10 MG/ML IJ SOLN
4.0000 mg | Freq: Once | INTRAMUSCULAR | Status: AC
  Administered 2018-04-12: 4 mg via INTRAVENOUS

## 2018-04-12 MED ORDER — DEXAMETHASONE SODIUM PHOSPHATE 10 MG/ML IJ SOLN
INTRAMUSCULAR | Status: AC
  Filled 2018-04-12: qty 1

## 2018-04-12 MED ORDER — HEPARIN SOD (PORK) LOCK FLUSH 100 UNIT/ML IV SOLN
500.0000 [IU] | Freq: Once | INTRAVENOUS | Status: AC | PRN
  Administered 2018-04-12: 500 [IU]
  Filled 2018-04-12: qty 5

## 2018-04-12 MED ORDER — FAMOTIDINE IN NACL 20-0.9 MG/50ML-% IV SOLN
INTRAVENOUS | Status: AC
  Filled 2018-04-12: qty 50

## 2018-04-12 MED ORDER — SODIUM CHLORIDE 0.9 % IV SOLN
Freq: Once | INTRAVENOUS | Status: AC
  Administered 2018-04-12: 11:00:00 via INTRAVENOUS
  Filled 2018-04-12: qty 250

## 2018-04-12 NOTE — Progress Notes (Signed)
[facility]  [phone] [phone]     ID: Margaret Hodges DOB: 11/27/1945  MR#: [id number]  CSN#:[id number]  Patient Care Team: Wolters, Sharon, MD as PCP - General (Family Medicine) Ingram, Haywood, MD as Consulting Physician (General Surgery) Magrinat, Gustav C, MD as Consulting Physician (Oncology) Moody, John, MD as Consulting Physician (Radiation Oncology) Hall, John, MD (Dermatology) McLean, Dalton S, MD as Consulting Physician (Cardiology) OTHER MD:  CHIEF COMPLAINT: Estrogen receptor positive breast cancer  CURRENT TREATMENT: Paclitaxel, trastuzumab   HISTORY OF CURRENT ILLNESS: From the original intake note:  Margaret Hodges had routine screening mammography on 01/03/2018 showing a possible abnormality in the left breast. She underwent bilateral diagnostic mammography with tomography and left breast ultrasonography at [facility] on 01/15/2018 showing: Breast density category B; a 1.3 cm round solid mass with an indistinct margin in the left breast at 7 o'clock posterior 8 cm from the nipple. No significant abnormalities found in the left axilla.  Accordingly on 01/16/2018 she proceeded to biopsy of the left breast area in question. The pathology from this procedure showed ([id number]): Invasive ductal carcinoma, grade III. Prognostic indicators significant for: estrogen receptor, 60% positive, with weak staining intensity; progesterone receptor negative, Proliferation marker Ki67 at 80%. HER2 positive by immunohistochemistry, 3+.  The patient's subsequent history is as detailed below.   INTERVAL HISTORY: Margaret Hodges returns today for follow-up and treatment of her estrogen receptor positive breast cancer.   The patient continues on paclitaxel weekly. Today is day 1 cycle 6 of 12 planned cycles.   She also receives trastuzumab, every 21 days, with a dose due next week on 04/19/2018.  Her last echocardiogram on 02/06/18 showed an ejection fraction  in the 60% - 65% range.  Her next echocardiogram is scheduled on 06/06/2018 with Dr. Bensimhon.   REVIEW OF SYSTEMS: Margaret Hodges is feeling well today.  She continues to tolerate her treatment well.  She notes an occasional intermittent peripheral neuroapthy in the very tips of her toes bilaterally.  She denies any in her fingertips.  She denies any motor changes, such as balance issues.  She denies any pain.    Margaret Hodges denies any fevers, chills, mouth sores, appetite changes.  She is without cough, shortness of breath, chest pain, or palpitations.  She has not bowel/bladder changes, nausea or vomiting.  A detailed ROS was otherwise non contributory today.     PAST MEDICAL HISTORY: Past Medical History:  Diagnosis Date  . Anginal pain (HCC)    with admission to hospital due to high blood pressure  . Complication of anesthesia   . Family history of ovarian cancer   . GERD (gastroesophageal reflux disease)   . Guillain Barr syndrome (HCC) 1959  . Guillain Barr syndrome (HCC) 1959  . Guillain Barr syndrome (HCC) 1959  . Headache    sinus headaches  . History of hiatal hernia    was told pt. had one, but no testing  . Hypertensive urgency 04/05/2014  . Malignant neoplasm of lower-inner quadrant of left breast in female, estrogen receptor positive (HCC) 01/24/2018  . Neuromuscular disorder (HCC)    Guillian- Barre Syndrome  . Pneumonia    hx. of walking pneumonia  . PONV (postoperative nausea and vomiting)     PAST SURGICAL HISTORY: Past Surgical History:  Procedure Laterality Date  . ABDOMINAL HYSTERECTOMY     Total  . Bladder lift  15 years ago,  Uterus prolapse causing intestinal damage and surgery    . BREAST LUMPECTOMY   WITH RADIOACTIVE SEED AND SENTINEL LYMPH NODE BIOPSY Left 02/18/2018   Procedure: LEFT BREAST LUMPECTOMY WITH RADIOACTIVE SEED AND LEFT AXILLARY DEEP SENTINEL LYMPH NODE BIOPSY, INJECT BLUE DYE LEFT BREAST;  Surgeon: Ingram, Haywood, MD;  Location: [facility];  Service: General;  Laterality: Left;  . PORTACATH PLACEMENT Right 02/18/2018   Procedure: INSERTION PORT-A-CATH;  Surgeon: Ingram, Haywood, MD;  Location:  SURGERY CENTER;  Service: General;  Laterality: Right;  . Right torn meniscus repair x 2    . THYROID LOBECTOMY N/A 02/04/2015   Procedure: TOTAL THYROIDECTOMY;  Surgeon: Todd Gerkin, MD;  Location: [facility];  Service: General;  Laterality: N/A;  . TONSILLECTOMY    . TUMOR REMOVAL    . Tumors on arms removed,  Tumor on gum removed,  Tumor removed from inside ear      FAMILY HISTORY Family History  Problem Relation Age of Onset  . Ovarian cancer Sister   . Lung cancer Sister   . Throat cancer Brother   . Brain cancer Sister    She notes that her father died from old age at age 86. Patients' mother died from multiple comorbidities at age 84. The patient has 5 brothers and 6 sisters. Patients' sister had ovarian cancer at age 62 and lung cancer at 56, brother with throat cancer at age 67, and sister with brain cancer at age 66.    GYNECOLOGIC HISTORY:  No LMP recorded. Patient has had a hysterectomy. 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 Her husband was a supervisor at [employer]. Her son, Troy Hakeem manages a call center for medical supplies. Her daughter, Michelle Leggett, is Patient Care surgical coordinator at [employer].  The patient has 2 grandchildren. She attends [employer].     ADVANCED DIRECTIVES: Her Healthcare Power of Attorney is her daughter, Michelle and can be reached at [phone].     HEALTH MAINTENANCE: Social History   Tobacco Use  . Smoking status: Current Every Day Smoker    Packs/day: 1.50    Types: Cigarettes  . Smokeless tobacco: Never Used    Substance Use Topics  . Alcohol use: No  . Drug use: No     Colonoscopy: Yes through [facility]  PAP:   Bone density:    Allergies  Allergen Reactions  . Allegra [Fexofenadine] Hives  . Clindamycin/Lincomycin Hives and Diarrhea  . Influenza A (H1n1) Monoval Vac Other (See Comments)    Guillian Barre Syndrome  . Keflex [Cephalexin] Hives  . Penicillins Swelling    Has patient had a PCN reaction causing immediate rash, facial/tongue/throat swelling, SOB or lightheadedness with hypotension: No Has patient had a PCN reaction causing severe rash involving mucus membranes or skin necrosis: No Has patient had a PCN reaction that required hospitalization No Has patient had a PCN reaction occurring within the last 10 years: No If all of the above answers are "NO", then may proceed with Cephalosporin use.   . Prednisone Other (See Comments)    "heart attack symptoms" but even had heart cath & had no cardiac disease  . Protonix [Pantoprazole Sodium] Diarrhea    Tolerates Prilosec  . Statins Other (See Comments)    myalgias    Current Outpatient Medications  Medication Sig Dispense   Refill  . amLODipine (NORVASC) 5 MG tablet Take 1 tablet (5 mg total) by mouth daily. 30 tablet 0  . Cholecalciferol (VITAMIN D) 2000 UNITS tablet Take 2,000 Units by mouth daily.    . diclofenac sodium (VOLTAREN) 1 % GEL Apply 4 g topically 4 (four) times daily as needed (arthritis pain).     Marland Kitchen HYDROcodone-acetaminophen (NORCO) 5-325 MG tablet Take 1-2 tablets by mouth every 6 (six) hours as needed for moderate pain or severe pain. 30 tablet 0  . hyoscyamine (LEVBID) 0.375 MG 12 hr tablet Take 0.375 mg by mouth every 12 (twelve) hours as needed (blaader spasms).    Marland Kitchen levothyroxine (SYNTHROID) 88 MCG tablet Take 1 tablet (88 mcg total) by mouth daily before breakfast. 30 tablet 3  . lidocaine-prilocaine (EMLA) cream Apply to affected area once 30 g 3  . omeprazole (PRILOSEC) 20 MG capsule Take 20 mg by mouth  daily.    Marland Kitchen Phenylephrine-Bromphen-DM (PHENYLEPHRINE COMPLEX PO) Take 5 mg by mouth as directed. Takes every morning and may takes up to 2 additional tablets if needed for sinus congestion during the day    . prochlorperazine (COMPAZINE) 10 MG tablet Take 1 tablet (10 mg total) by mouth every 6 (six) hours as needed (Nausea or vomiting). 30 tablet 1  . saccharomyces boulardii (FLORASTOR) 250 MG capsule Take 250 mg by mouth daily as needed (only when on antibiotic).     No current facility-administered medications for this visit.    Facility-Administered Medications Ordered in Other Visits  Medication Dose Route Frequency Provider Last Rate Last Dose  . heparin lock flush 100 unit/mL  500 Units Intracatheter Once PRN Magrinat, Virgie Dad, MD      . PACLitaxel (TAXOL) 150 mg in sodium chloride 0.9 % 250 mL chemo infusion (</= 75m/m2)  80 mg/m2 (Treatment Plan Recorded) Intravenous Once Magrinat, GVirgie Dad MD      . sodium chloride flush (NS) 0.9 % injection 10 mL  10 mL Intracatheter PRN Magrinat, GVirgie Dad MD        OBJECTIVE:  Vitals:   04/12/18 0957  BP: 128/79  Pulse: 75  Resp: 18  Temp: 97.6 F (36.4 C)  SpO2: 100%     Body mass index is 25.42 kg/m.   Wt Readings from Last 3 Encounters:  04/12/18 167 lb 3.2 oz (75.8 kg)  04/05/18 165 lb 12.8 oz (75.2 kg)  03/22/18 164 lb 6.4 oz (74.6 kg)      ECOG FS:1 - Symptomatic but completely ambulatory GENERAL: Patient is a well appearing female in no acute distress HEENT:  Sclerae anicteric.  Oropharynx clear and moist. No ulcerations or evidence of oropharyngeal candidiasis. Neck is supple.  NODES:  No cervical, supraclavicular, or axillary lymphadenopathy palpated.  BREAST EXAM:  S/p left breast lumpectomy, no sign of local recurrence, right breast benign LUNGS:  Clear to auscultation bilaterally.  No wheezes or rhonchi. HEART:  Regular rate and rhythm. No murmur appreciated. ABDOMEN:  Soft, nontender.  Positive, normoactive bowel  sounds. No organomegaly palpated. MSK:  No focal spinal tenderness to palpation. Full range of motion bilaterally in the upper extremities. EXTREMITIES:  No peripheral edema.   SKIN:  Clear with no obvious rashes or skin changes. No nail dyscrasia. NEURO:  Nonfocal. Well oriented.  Appropriate affect.    LAB RESULTS:  CMP     Component Value Date/Time   NA 139 04/12/2018 0940   K 4.4 04/12/2018 0940   CL 106 04/12/2018 0940   CO2  23 04/12/2018 0940   GLUCOSE 99 04/12/2018 0940   BUN 16 04/12/2018 0940   CREATININE 0.75 04/12/2018 0940   CREATININE 0.95 01/30/2018 1205   CALCIUM 9.9 04/12/2018 0940   PROT 7.1 04/12/2018 0940   ALBUMIN 3.9 04/12/2018 0940   AST 13 (L) 04/12/2018 0940   AST 13 (L) 01/30/2018 1205   ALT 22 04/12/2018 0940   ALT 17 01/30/2018 1205   ALKPHOS 90 04/12/2018 0940   BILITOT 0.4 04/12/2018 0940   BILITOT <0.2 (L) 01/30/2018 1205   GFRNONAA >60 04/12/2018 0940   GFRNONAA 59 (L) 01/30/2018 1205   GFRAA >60 04/12/2018 0940   GFRAA >60 01/30/2018 1205    Lab Results  Component Value Date   TOTALPROTELP 7.5 05/08/2017    No results found for: KPAFRELGTCHN, LAMBDASER, KAPLAMBRATIO  Lab Results  Component Value Date   WBC 9.4 04/12/2018   NEUTROABS 5.9 04/12/2018   HGB 12.8 04/12/2018   HCT 37.8 04/12/2018   MCV 100.0 04/12/2018   PLT 312 04/12/2018    _0 @  No results found for: LABCA2  No components found for: VELFYB017  No results for input(s): INR in the last 168 hours.  No results found for: LABCA2  No results found for: PZW258  No results found for: NID782  No results found for: UMP536  No results found for: CA2729  No components found for: HGQUANT  No results found for: CEA1 / No results found for: CEA1   No results found for: AFPTUMOR  No results found for: CHROMOGRNA  No results found for: PSA1  Appointment on 04/12/2018  Component Date Value Ref Range Status  . Vitamin B-12 04/12/2018 325  180 -  914 pg/mL Final   Comment: (NOTE) This assay is not validated for testing neonatal or myeloproliferative syndrome specimens for Vitamin B12 levels. Performed at [facility], [place] [address] [place], [place] [address]   . Sodium 04/12/2018 139  135 - 145 mmol/L Final  . Potassium 04/12/2018 4.4  3.5 - 5.1 mmol/L Final  . Chloride 04/12/2018 106  98 - 111 mmol/L Final  . CO2 04/12/2018 23  22 - 32 mmol/L Final  . Glucose, Bld 04/12/2018 99  70 - 99 mg/dL Final  . BUN 04/12/2018 16  8 - 23 mg/dL Final  . Creatinine, Ser 04/12/2018 0.75  0.44 - 1.00 mg/dL Final  . Calcium 04/12/2018 9.9  8.9 - 10.3 mg/dL Final  . Total Protein 04/12/2018 7.1  6.5 - 8.1 g/dL Final  . Albumin 04/12/2018 3.9  3.5 - 5.0 g/dL Final  . AST 04/12/2018 13* 15 - 41 U/L Final  . ALT 04/12/2018 22  0 - 44 U/L Final  . Alkaline Phosphatase 04/12/2018 90  38 - 126 U/L Final  . Total Bilirubin 04/12/2018 0.4  0.3 - 1.2 mg/dL Final  . GFR calc non Af Amer 04/12/2018 >60  >60 mL/min Final  . GFR calc Af Amer 04/12/2018 >60  >60 mL/min Final  . Anion gap 04/12/2018 10  5 - 15 Final   Performed at [facility], [place] [address] [place], [place] [address]  . WBC 04/12/2018 9.4  4.0 - 10.5 K/uL Final  . RBC 04/12/2018 3.78* 3.87 - 5.11 MIL/uL Final  . Hemoglobin 04/12/2018 12.8  12.0 - 15.0 g/dL Final  . HCT 04/12/2018 37.8  36.0 - 46.0 % Final  . MCV 04/12/2018 100.0  80.0 - 100.0 fL Final  . MCH 04/12/2018 33.9  26.0 -  34.0 pg Final  . MCHC 04/12/2018 33.9  30.0 - 36.0 g/dL Final  . RDW 04/12/2018 14.4  11.5 - 15.5 % Final  . Platelets 04/12/2018 312  150 - 400 K/uL Final  . nRBC 04/12/2018 0.0  0.0 - 0.2 % Final  . Neutrophils Relative % 04/12/2018 61  % Final  . Neutro Abs 04/12/2018 5.9  1.7 - 7.7 K/uL Final  . Lymphocytes Relative 04/12/2018 28  % Final  . Lymphs Abs 04/12/2018 2.6  0.7 - 4.0 K/uL Final  . Monocytes Relative 04/12/2018 7  % Final  . Monocytes Absolute  04/12/2018 0.6  0.1 - 1.0 K/uL Final  . Eosinophils Relative 04/12/2018 2  % Final  . Eosinophils Absolute 04/12/2018 0.2  0.0 - 0.5 K/uL Final  . Basophils Relative 04/12/2018 1  % Final  . Basophils Absolute 04/12/2018 0.1  0.0 - 0.1 K/uL Final  . Immature Granulocytes 04/12/2018 1  % Final  . Abs Immature Granulocytes 04/12/2018 0.06  0.00 - 0.07 K/uL Final   Performed at [facility], [address] [place], [place] [address]    (this displays the last labs from the last 3 days)  Lab Results  Component Value Date   TOTALPROTELP 7.5 05/08/2017   (this displays SPEP labs)  No results found for: KPAFRELGTCHN, LAMBDASER, KAPLAMBRATIO (kappa/lambda light chains)  No results found for: HGBA, HGBA2QUANT, HGBFQUANT, HGBSQUAN (Hemoglobinopathy evaluation)   Lab Results  Component Value Date   LDH 171 05/08/2017    No results found for: IRON, TIBC, IRONPCTSAT (Iron and TIBC)  No results found for: FERRITIN  Urinalysis    Component Value Date/Time   COLORURINE YELLOW 04/04/2014 2112   APPEARANCEUR CLEAR 04/04/2014 2112   LABSPEC 1.017 04/04/2014 2112   PHURINE 6.0 04/04/2014 2112   GLUCOSEU NEGATIVE 04/04/2014 2112   HGBUR NEGATIVE 04/04/2014 2112   BILIRUBINUR NEGATIVE 04/04/2014 2112   KETONESUR NEGATIVE 04/04/2014 2112   PROTEINUR NEGATIVE 04/04/2014 2112   UROBILINOGEN 0.2 04/04/2014 2112   NITRITE NEGATIVE 04/04/2014 2112   LEUKOCYTESUR NEGATIVE 04/04/2014 2112     STUDIES:  No results found.    ELIGIBLE FOR AVAILABLE RESEARCH PROTOCOL: no   ASSESSMENT: 72 y.o. [place] woman status post left breast lower inner quadrant biopsy 01/16/2018 for a clinical T1c N0, stage IA invasive ductal carcinoma, grade 3, estrogen receptor weakly positive, progesterone receptor negative, with an MIB-1 of 80%, and HER-2 amplified  (1) genetics 03/06/2018 showed no deleterious mutations  (2) status post left lumpectomy and sentinel lymph node  sampling 02/18/2018 for a pT2 pN0, stage IIA invasive ductal carcinoma, grade 3, with negative margins.  (a) a total of 5 lymph nodes were removed  (3) adjuvant chemo immunotherapy consisting of paclitaxel/trastuzumab weekly for 12 weeks, started 03/08/2018.  (4) trastuzumab to be continued to complete a year  (a) echocardiogram 02/06/2018 shows an ejection fraction in the 60-65% range  (5) adjuvant radiation as appropriate  (6) antiestrogens to follow at the completion of local treatment.  (7) tobacco abuse disorder: The patient has been strongly advised to discontinue smoking   PLAN: Margaret Hodges is doing well today.  Her labs so far ar stable today.  I reviewed those with her in detail.    We reviewed this issue of her grade 1 intermittent peripheral neuropathy.  She is icing her fingertips and toes.  We reviewed the risks of continuing with her treatments with the neuropathy.  We reviewed that she may very well have to stop   chemotherapy early, and discussed her getting a week off.  I let her know that after stopping the Paclitaxel, that the neuropathy can sometimes worsen.  Margaret Hodges verbalizes understanding of this and very much so wants to proceed with the Paclitaxel today.  She will do this, but will see Dr. Magrinat next week to review her neuropathy in detail and evaluate how she is doing.   She knows to call for any other issues that may develop before then.  A total of (20) minutes of face-to-face time was spent with this patient with greater than 50% of that time in counseling and care-coordination.    Lindsey Causey, NP  04/12/18 11:42 AM Medical Oncology and Hematology [facility] [address] [place], [place] [address] Tel. [phone]    Fax. [phone]      

## 2018-04-12 NOTE — Patient Instructions (Signed)
[facility] Discharge Instructions for Patients Receiving Chemotherapy  Today you received the following chemotherapy agents Paclitaxel (TAXOL).  To help prevent nausea and vomiting after your treatment, we encourage you to take your nausea medication as prescribed.  If you develop nausea and vomiting that is not controlled by your nausea medication, call the clinic.   BELOW ARE SYMPTOMS THAT SHOULD BE REPORTED IMMEDIATELY:  *FEVER GREATER THAN 100.5 F  *CHILLS WITH OR WITHOUT FEVER  NAUSEA AND VOMITING THAT IS NOT CONTROLLED WITH YOUR NAUSEA MEDICATION  *UNUSUAL SHORTNESS OF BREATH  *UNUSUAL BRUISING OR BLEEDING  TENDERNESS IN MOUTH AND THROAT WITH OR WITHOUT PRESENCE OF ULCERS  *URINARY PROBLEMS  *BOWEL PROBLEMS  UNUSUAL RASH Items with * indicate a potential emergency and should be followed up as soon as possible.  Feel free to call the clinic should you have any questions or concerns. The clinic phone number is [phone].  Please show the CHEMO ALERT CARD at check-in to the Emergency Department and triage nurse.   

## 2018-04-12 NOTE — Telephone Encounter (Signed)
No los °

## 2018-04-14 LAB — ANTI-PARIETAL ANTIBODY: Parietal Cell Antibody-IgG: 2.3 Units (ref 0.0–20.0)

## 2018-04-15 LAB — INTRINSIC FACTOR ANTIBODIES: Intrinsic Factor: 1.1 AU/mL (ref 0.0–1.1)

## 2018-04-18 NOTE — Progress Notes (Signed)
[facility]  [phone] [phone]     ID: [name] DOB: 10/08/1945  MR#: [id number]  CSN#:[id number]  Patient Care Team: Wolters, Sharon, MD as PCP - General (Family Medicine) Ingram, Haywood, MD as Consulting Physician (General Surgery) Magrinat, Gustav C, MD as Consulting Physician (Oncology) Moody, John, MD as Consulting Physician (Radiation Oncology) Hall, John, MD (Dermatology) McLean, Dalton S, MD as Consulting Physician (Cardiology) OTHER MD:  CHIEF COMPLAINT: Estrogen receptor positive breast cancer  CURRENT TREATMENT: trastuzumab   HISTORY OF CURRENT ILLNESS: From the original intake note:  Margaret Hodges had routine screening mammography on 01/03/2018 showing a possible abnormality in the left breast. She underwent bilateral diagnostic mammography with tomography and left breast ultrasonography at [facility] on 01/15/2018 showing: Breast density category B; a 1.3 cm round solid mass with an indistinct margin in the left breast at 7 o'clock posterior 8 cm from the nipple. No significant abnormalities found in the left axilla.  Accordingly on 01/16/2018 she proceeded to biopsy of the left breast area in question. The pathology from this procedure showed ([id number]): Invasive ductal carcinoma, grade III. Prognostic indicators significant for: estrogen receptor, 60% positive, with weak staining intensity; progesterone receptor negative, Proliferation marker Ki67 at 80%. HER2 positive by immunohistochemistry, 3+.  The patient's subsequent history is as detailed below.   INTERVAL HISTORY: Margaret Hodges returns today for follow-up and treatment of her estrogen receptor positive breast cancer.   She continues on paclitaxol. Today is day 1 cycle 7 of 12 planned cycles. She has started to get neuropathy in her fingers (1st, 4th, 5th digits mostly), toes, and the bottom of her feet.   She also continues on trastuzumab, received every 21 days, with a  dose due today.  [name]'s last echocardiogram on 02/06/2018, showed an ejection fraction in the 60% - 65% range.    REVIEW OF SYSTEMS: Rosette she gets tired and achy in the afternoons, but she tries to stay active during the day. The more active she is, the better she feels during the day, however. She did not lose her hair completely, but it has thinned considerably. The patient denies unusual headaches, visual changes, nausea, vomiting, or dizziness. There has been no unusual cough, phlegm production, or pleurisy. This been no change in bowel or bladder habits. The patient denies unexplained fatigue or unexplained weight loss, bleeding, rash, or fever. A detailed review of systems was otherwise noncontributory.    PAST MEDICAL HISTORY: Past Medical History:  Diagnosis Date  . Anginal pain (HCC)    with admission to hospital due to high blood pressure  . Complication of anesthesia   . Family history of ovarian cancer   . GERD (gastroesophageal reflux disease)   . Guillain Barr syndrome (HCC) 1959  . Guillain Barr syndrome (HCC) 1959  . Guillain Barr syndrome (HCC) 1959  . Headache    sinus headaches  . History of hiatal hernia    was told pt. had one, but no testing  . Hypertensive urgency 04/05/2014  . Malignant neoplasm of lower-inner quadrant of left breast in female, estrogen receptor positive (HCC) 01/24/2018  . Neuromuscular disorder (HCC)    Guillian- Barre Syndrome  . Pneumonia    hx. of walking pneumonia  . PONV (postoperative nausea and vomiting)     PAST SURGICAL HISTORY: Past Surgical History:  Procedure Laterality Date  . ABDOMINAL HYSTERECTOMY     Total  . Bladder lift  15 years ago,  Uterus prolapse causing intestinal damage and   surgery    . BREAST LUMPECTOMY WITH RADIOACTIVE SEED AND SENTINEL LYMPH NODE BIOPSY Left 02/18/2018   Procedure: LEFT BREAST LUMPECTOMY WITH RADIOACTIVE SEED AND LEFT AXILLARY DEEP SENTINEL LYMPH NODE BIOPSY, INJECT BLUE DYE LEFT  BREAST;  Surgeon: Fanny Skates, MD;  Location: [facility];  Service: General;  Laterality: Left;  . PORTACATH PLACEMENT Right 02/18/2018   Procedure: INSERTION PORT-A-CATH;  Surgeon: Fanny Skates, MD;  Location: [facility];  Service: General;  Laterality: Right;  . Right torn meniscus repair x 2    . THYROID LOBECTOMY N/A 02/04/2015   Procedure: TOTAL THYROIDECTOMY;  Surgeon: Armandina Gemma, MD;  Location: [facility];  Service: General;  Laterality: N/A;  . TONSILLECTOMY    . TUMOR REMOVAL    . Tumors on arms removed,  Tumor on gum removed,  Tumor removed from inside ear      FAMILY HISTORY Family History  Problem Relation Age of Onset  . Ovarian cancer Sister   . Lung cancer Sister   . Throat cancer Brother   . Brain cancer Sister    She notes that her father died from old age at age 75. Patients' mother died from multiple comorbidities at age 63. The patient has 5 brothers and 6 sisters. Patients' sister had ovarian cancer at age 2 and lung cancer at 24, brother with throat cancer at age 100, and sister with brain cancer at age 58.    GYNECOLOGIC HISTORY:  No LMP recorded. Patient has had a hysterectomy. 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 Her husband was a Librarian, academic at [employer].  He now has significant medical issues and the patient is his caregiver her son, [name] manages a call center for medical supplies. Her daughter, [name], is Patient Care surgical coordinator at [employer] clinic.  The patient has 2 grandchildren. She attends [employer].     ADVANCED DIRECTIVES: Her [place] is her daughter, [name] and can be reached at [phone].     HEALTH MAINTENANCE: Social  History   Tobacco Use  . Smoking status: Current Every Day Smoker    Packs/day: 1.50    Types: Cigarettes  . Smokeless tobacco: Never Used  Substance Use Topics  . Alcohol use: No  . Drug use: No     Colonoscopy: Yes through [facility]  PAP:   Bone density:    Allergies  Allergen Reactions  . Allegra [Fexofenadine] Hives  . Clindamycin/Lincomycin Hives and Diarrhea  . Influenza A (H1n1) Monoval Vac Other (See Comments)    Guillian Barre Syndrome  . Keflex [Cephalexin] Hives  . Penicillins Swelling    Has patient had a PCN reaction causing immediate rash, facial/tongue/throat swelling, SOB or lightheadedness with hypotension: No Has patient had a PCN reaction causing severe rash involving mucus membranes or skin necrosis: No Has patient had a PCN reaction that required hospitalization No Has patient had a PCN reaction occurring within the last 10 years: No If all of the above answers are "NO", then may proceed with Cephalosporin use.   . Prednisone Other (See Comments)    "heart attack symptoms" but even had heart cath & had no cardiac disease  . Protonix [Pantoprazole Sodium] Diarrhea    Tolerates Prilosec  .  Statins Other (See Comments)    myalgias    Current Outpatient Medications  Medication Sig Dispense Refill  . amLODipine (NORVASC) 5 MG tablet Take 1 tablet (5 mg total) by mouth daily. 30 tablet 0  . Cholecalciferol (VITAMIN D) 2000 UNITS tablet Take 2,000 Units by mouth daily.    . diclofenac sodium (VOLTAREN) 1 % GEL Apply 4 g topically 4 (four) times daily as needed (arthritis pain).     . HYDROcodone-acetaminophen (NORCO) 5-325 MG tablet Take 1-2 tablets by mouth every 6 (six) hours as needed for moderate pain or severe pain. 30 tablet 0  . hyoscyamine (LEVBID) 0.375 MG 12 hr tablet Take 0.375 mg by mouth every 12 (twelve) hours as needed (blaader spasms).    . levothyroxine (SYNTHROID) 88 MCG tablet Take 1 tablet (88 mcg total) by mouth daily before breakfast. 30  tablet 3  . omeprazole (PRILOSEC) 20 MG capsule Take 20 mg by mouth daily.    . Phenylephrine-Bromphen-DM (PHENYLEPHRINE COMPLEX PO) Take 5 mg by mouth as directed. Takes every morning and may takes up to 2 additional tablets if needed for sinus congestion during the day    . saccharomyces boulardii (FLORASTOR) 250 MG capsule Take 250 mg by mouth daily as needed (only when on antibiotic).     No current facility-administered medications for this visit.    Facility-Administered Medications Ordered in Other Visits  Medication Dose Route Frequency Provider Last Rate Last Dose  . sodium chloride flush (NS) 0.9 % injection 10 mL  10 mL Intracatheter PRN Magrinat, Gustav C, MD   10 mL at 04/19/18 1211    OBJECTIVE: Latest white woman in no acute distress Vitals:   04/19/18 0955  BP: 136/79  Pulse: 85  Resp: 20  Temp: 97.7 F (36.5 C)  SpO2: 100%     Body mass index is 25.47 kg/m.   Wt Readings from Last 3 Encounters:  04/19/18 167 lb 8 oz (76 kg)  04/12/18 167 lb 3.2 oz (75.8 kg)  04/05/18 165 lb 12.8 oz (75.2 kg)      ECOG FS:1 - Symptomatic but completely ambulatory  Sclerae unicteric, EOMs intact Oropharynx clear and moist No cervical or supraclavicular adenopathy Lungs no rales or rhonchi Heart regular rate and rhythm Abd soft, nontender, positive bowel sounds MSK no focal spinal tenderness, no upper extremity lymphedema Neuro: nonfocal, well oriented, appropriate affect Breasts: Right breast is unremarkable.  The left breast is status post lumpectomy.  There is no residual or recurrent disease.  Both axillae are benign.    LAB RESULTS:  CMP     Component Value Date/Time   NA 139 04/19/2018 0849   K 4.4 04/19/2018 0849   CL 106 04/19/2018 0849   CO2 24 04/19/2018 0849   GLUCOSE 101 (H) 04/19/2018 0849   BUN 16 04/19/2018 0849   CREATININE 0.76 04/19/2018 0849   CREATININE 0.95 01/30/2018 1205   CALCIUM 9.7 04/19/2018 0849   PROT 7.1 04/19/2018 0849   ALBUMIN  3.8 04/19/2018 0849   AST 13 (L) 04/19/2018 0849   AST 13 (L) 01/30/2018 1205   ALT 25 04/19/2018 0849   ALT 17 01/30/2018 1205   ALKPHOS 85 04/19/2018 0849   BILITOT 0.3 04/19/2018 0849   BILITOT <0.2 (L) 01/30/2018 1205   GFRNONAA >60 04/19/2018 0849   GFRNONAA 59 (L) 01/30/2018 1205   GFRAA >60 04/19/2018 0849   GFRAA >60 01/30/2018 1205    Lab Results  Component Value Date   TOTALPROTELP 7.5   05/08/2017    No results found for: KPAFRELGTCHN, LAMBDASER, KAPLAMBRATIO  Lab Results  Component Value Date   WBC 11.6 (H) 04/19/2018   NEUTROABS 7.6 04/19/2018   HGB 12.8 04/19/2018   HCT 39.0 04/19/2018   MCV 101.6 (H) 04/19/2018   PLT 330 04/19/2018    @LASTCHEMISTRY@  No results found for: LABCA2  No components found for: LABCAN125  No results for input(s): INR in the last 168 hours.  No results found for: LABCA2  No results found for: CAN199  No results found for: CAN125  No results found for: CAN153  No results found for: CA2729  No components found for: HGQUANT  No results found for: CEA1 / No results found for: CEA1   No results found for: AFPTUMOR  No results found for: CHROMOGRNA  No results found for: PSA1  Appointment on 04/19/2018  Component Date Value Ref Range Status  . Sodium 04/19/2018 139  135 - 145 mmol/L Final  . Potassium 04/19/2018 4.4  3.5 - 5.1 mmol/L Final  . Chloride 04/19/2018 106  98 - 111 mmol/L Final  . CO2 04/19/2018 24  22 - 32 mmol/L Final  . Glucose, Bld 04/19/2018 101* 70 - 99 mg/dL Final  . BUN 04/19/2018 16  8 - 23 mg/dL Final  . Creatinine, Ser 04/19/2018 0.76  0.44 - 1.00 mg/dL Final  . Calcium 04/19/2018 9.7  8.9 - 10.3 mg/dL Final  . Total Protein 04/19/2018 7.1  6.5 - 8.1 g/dL Final  . Albumin 04/19/2018 3.8  3.5 - 5.0 g/dL Final  . AST 04/19/2018 13* 15 - 41 U/L Final  . ALT 04/19/2018 25  0 - 44 U/L Final  . Alkaline Phosphatase 04/19/2018 85  38 - 126 U/L Final  . Total Bilirubin 04/19/2018 0.3  0.3 - 1.2  mg/dL Final  . GFR calc non Af Amer 04/19/2018 >60  >60 mL/min Final  . GFR calc Af Amer 04/19/2018 >60  >60 mL/min Final  . Anion gap 04/19/2018 9  5 - 15 Final   Performed at [facility], [address] [place], [place] [address]  . WBC 04/19/2018 11.6* 4.0 - 10.5 K/uL Final  . RBC 04/19/2018 3.84* 3.87 - 5.11 MIL/uL Final  . Hemoglobin 04/19/2018 12.8  12.0 - 15.0 g/dL Final  . HCT 04/19/2018 39.0  36.0 - 46.0 % Final  . MCV 04/19/2018 101.6* 80.0 - 100.0 fL Final  . MCH 04/19/2018 33.3  26.0 - 34.0 pg Final  . MCHC 04/19/2018 32.8  30.0 - 36.0 g/dL Final  . RDW 04/19/2018 14.7  11.5 - 15.5 % Final  . Platelets 04/19/2018 330  150 - 400 K/uL Final  . nRBC 04/19/2018 0.0  0.0 - 0.2 % Final  . Neutrophils Relative % 04/19/2018 65  % Final  . Neutro Abs 04/19/2018 7.6  1.7 - 7.7 K/uL Final  . Lymphocytes Relative 04/19/2018 24  % Final  . Lymphs Abs 04/19/2018 2.8  0.7 - 4.0 K/uL Final  . Monocytes Relative 04/19/2018 8  % Final  . Monocytes Absolute 04/19/2018 0.9  0.1 - 1.0 K/uL Final  . Eosinophils Relative 04/19/2018 1  % Final  . Eosinophils Absolute 04/19/2018 0.1  0.0 - 0.5 K/uL Final  . Basophils Relative 04/19/2018 1  % Final  . Basophils Absolute 04/19/2018 0.1  0.0 - 0.1 K/uL Final  . Immature Granulocytes 04/19/2018 1  % Final  . Abs Immature Granulocytes 04/19/2018 0.09* 0.00 - 0.07 K/uL Final     Performed at [facility], [address] [place], [place] [address]    (this displays the last labs from the last 3 days)  Lab Results  Component Value Date   TOTALPROTELP 7.5 05/08/2017   (this displays SPEP labs)  No results found for: KPAFRELGTCHN, LAMBDASER, KAPLAMBRATIO (kappa/lambda light chains)  No results found for: HGBA, HGBA2QUANT, HGBFQUANT, HGBSQUAN (Hemoglobinopathy evaluation)   Lab Results  Component Value Date   LDH 171 05/08/2017    No results found for: IRON, TIBC, IRONPCTSAT (Iron and  TIBC)  No results found for: FERRITIN  Urinalysis    Component Value Date/Time   COLORURINE YELLOW 04/04/2014 2112   APPEARANCEUR CLEAR 04/04/2014 2112   LABSPEC 1.017 04/04/2014 2112   PHURINE 6.0 04/04/2014 2112   GLUCOSEU NEGATIVE 04/04/2014 2112   HGBUR NEGATIVE 04/04/2014 2112   BILIRUBINUR NEGATIVE 04/04/2014 2112   KETONESUR NEGATIVE 04/04/2014 2112   PROTEINUR NEGATIVE 04/04/2014 2112   UROBILINOGEN 0.2 04/04/2014 2112   NITRITE NEGATIVE 04/04/2014 2112   LEUKOCYTESUR NEGATIVE 04/04/2014 2112     STUDIES:  No results found.    ELIGIBLE FOR AVAILABLE RESEARCH PROTOCOL: no   ASSESSMENT: 72 y.o. [place] woman status post left breast lower inner quadrant biopsy 01/16/2018 for a clinical T1c N0, stage IA invasive ductal carcinoma, grade 3, estrogen receptor weakly positive, progesterone receptor negative, with an MIB-1 of 80%, and HER-2 amplified  (1) genetics 03/06/2018 showed no deleterious mutations  (2) status post left lumpectomy and sentinel lymph node sampling 02/18/2018 for a pT2 pN0, stage IIA invasive ductal carcinoma, grade 3, with negative margins.  (a) a total of 5 lymph nodes were removed   (3) adjuvant chemo immunotherapy consisting of paclitaxel/trastuzumab weekly for 12 weeks, started 03/08/2018.  (a) paclitaxel discontinued after 6 doses because of peripheral neuropathy  (4) trastuzumab to be continued to complete a year  (a) echocardiogram 02/06/2018 shows an ejection fraction in the 60-65% range  (5) adjuvant radiation follow-up  (6) antiestrogens to follow at the completion of local treatment.  (a) bone density at [facility] 12/27/2017 shows a T score of -1.7  (7) tobacco abuse disorder: The patient has been strongly advised to discontinue smoking   PLAN: Marleen has developed peripheral neuropathy and we are stopping her paclitaxel.  We are continuing the trastuzumab through December of this year.  Her next echocardiogram is  scheduled for 06/06/2018  At this point I am referring her back to radiation oncology for adjuvant radiation treatment.  Once she completes radiation we will likely start Amoxifen or anastrozole.   She knows to call for any other issue that may develop before the next visit here.  Magrinat, Gustav C, MD  04/19/18 1:25 PM Medical Oncology and Hematology [facility] [address] , [place] [address] Tel. [phone]    Fax. [phone]  I, Amber Handy am acting as a scribe for Gustav C, Magrinat, MD.   I, Gustav Magrinat MD, have reviewed the above documentation for accuracy and completeness, and I agree with the above.   

## 2018-04-19 ENCOUNTER — Other Ambulatory Visit: Payer: Medicare Other

## 2018-04-19 ENCOUNTER — Inpatient Hospital Stay (HOSPITAL_BASED_OUTPATIENT_CLINIC_OR_DEPARTMENT_OTHER): Payer: Medicare Other | Admitting: Oncology

## 2018-04-19 ENCOUNTER — Inpatient Hospital Stay: Payer: Medicare Other

## 2018-04-19 ENCOUNTER — Telehealth: Payer: Self-pay | Admitting: Oncology

## 2018-04-19 VITALS — BP 136/79 | HR 85 | Temp 97.7°F | Resp 20 | Ht 68.0 in | Wt 167.5 lb

## 2018-04-19 DIAGNOSIS — I10 Essential (primary) hypertension: Secondary | ICD-10-CM

## 2018-04-19 DIAGNOSIS — Z5111 Encounter for antineoplastic chemotherapy: Secondary | ICD-10-CM | POA: Diagnosis not present

## 2018-04-19 DIAGNOSIS — M8580 Other specified disorders of bone density and structure, unspecified site: Secondary | ICD-10-CM

## 2018-04-19 DIAGNOSIS — Z79899 Other long term (current) drug therapy: Secondary | ICD-10-CM | POA: Diagnosis not present

## 2018-04-19 DIAGNOSIS — C50312 Malignant neoplasm of lower-inner quadrant of left female breast: Secondary | ICD-10-CM

## 2018-04-19 DIAGNOSIS — G629 Polyneuropathy, unspecified: Secondary | ICD-10-CM | POA: Diagnosis not present

## 2018-04-19 DIAGNOSIS — Z95828 Presence of other vascular implants and grafts: Secondary | ICD-10-CM

## 2018-04-19 DIAGNOSIS — Z720 Tobacco use: Secondary | ICD-10-CM

## 2018-04-19 DIAGNOSIS — Z170 Estrogen receptor positive status [ER+]: Principal | ICD-10-CM

## 2018-04-19 DIAGNOSIS — Z90710 Acquired absence of both cervix and uterus: Secondary | ICD-10-CM | POA: Diagnosis not present

## 2018-04-19 DIAGNOSIS — F17210 Nicotine dependence, cigarettes, uncomplicated: Secondary | ICD-10-CM

## 2018-04-19 DIAGNOSIS — Z5112 Encounter for antineoplastic immunotherapy: Secondary | ICD-10-CM | POA: Diagnosis not present

## 2018-04-19 LAB — COMPREHENSIVE METABOLIC PANEL
ALBUMIN: 3.8 g/dL (ref 3.5–5.0)
ALK PHOS: 85 U/L (ref 38–126)
ALT: 25 U/L (ref 0–44)
AST: 13 U/L — ABNORMAL LOW (ref 15–41)
Anion gap: 9 (ref 5–15)
BILIRUBIN TOTAL: 0.3 mg/dL (ref 0.3–1.2)
BUN: 16 mg/dL (ref 8–23)
CO2: 24 mmol/L (ref 22–32)
Calcium: 9.7 mg/dL (ref 8.9–10.3)
Chloride: 106 mmol/L (ref 98–111)
Creatinine, Ser: 0.76 mg/dL (ref 0.44–1.00)
GFR calc Af Amer: >60 mL/min (ref >60)
GFR calc non Af Amer: >60 mL/min (ref >60)
Glucose, Bld: 101 mg/dL — ABNORMAL HIGH (ref 70–99)
POTASSIUM: 4.4 mmol/L (ref 3.5–5.1)
Sodium: 139 mmol/L (ref 135–145)
Total Protein: 7.1 g/dL (ref 6.5–8.1)

## 2018-04-19 LAB — CBC WITH DIFFERENTIAL/PLATELET
ABS IMMATURE GRANULOCYTES: 0.09 10*3/uL — AB (ref 0.00–0.07)
Basophils Absolute: 0.1 10*3/uL (ref 0.0–0.1)
Basophils Relative: 1 %
Eosinophils Absolute: 0.1 10*3/uL (ref 0.0–0.5)
Eosinophils Relative: 1 %
HCT: 39 % (ref 36.0–46.0)
Hemoglobin: 12.8 g/dL (ref 12.0–15.0)
Immature Granulocytes: 1 %
LYMPHS PCT: 24 %
Lymphs Abs: 2.8 10*3/uL (ref 0.7–4.0)
MCH: 33.3 pg (ref 26.0–34.0)
MCHC: 32.8 g/dL (ref 30.0–36.0)
MCV: 101.6 fL — ABNORMAL HIGH (ref 80.0–100.0)
Monocytes Absolute: 0.9 10*3/uL (ref 0.1–1.0)
Monocytes Relative: 8 %
Neutro Abs: 7.6 10*3/uL (ref 1.7–7.7)
Neutrophils Relative %: 65 %
Platelets: 330 10*3/uL (ref 150–400)
RBC: 3.84 MIL/uL — ABNORMAL LOW (ref 3.87–5.11)
RDW: 14.7 % (ref 11.5–15.5)
WBC: 11.6 10*3/uL — ABNORMAL HIGH (ref 4.0–10.5)
nRBC: 0 % (ref 0.0–0.2)

## 2018-04-19 MED ORDER — SODIUM CHLORIDE 0.9 % IV SOLN
Freq: Once | INTRAVENOUS | Status: AC
  Administered 2018-04-19: 11:00:00 via INTRAVENOUS
  Filled 2018-04-19: qty 250

## 2018-04-19 MED ORDER — TRASTUZUMAB CHEMO 150 MG IV SOLR
450.0000 mg | Freq: Once | INTRAVENOUS | Status: AC
  Administered 2018-04-19: 450 mg via INTRAVENOUS
  Filled 2018-04-19: qty 21.43

## 2018-04-19 MED ORDER — ACETAMINOPHEN 325 MG PO TABS
650.0000 mg | ORAL_TABLET | Freq: Once | ORAL | Status: AC
  Administered 2018-04-19: 650 mg via ORAL

## 2018-04-19 MED ORDER — ACETAMINOPHEN 325 MG PO TABS
ORAL_TABLET | ORAL | Status: AC
  Filled 2018-04-19: qty 2

## 2018-04-19 MED ORDER — SODIUM CHLORIDE 0.9% FLUSH
10.0000 mL | INTRAVENOUS | Status: DC | PRN
  Administered 2018-04-19: 10 mL
  Filled 2018-04-19: qty 10

## 2018-04-19 MED ORDER — DIPHENHYDRAMINE HCL 25 MG PO CAPS
25.0000 mg | ORAL_CAPSULE | Freq: Once | ORAL | Status: AC
  Administered 2018-04-19: 25 mg via ORAL

## 2018-04-19 MED ORDER — DIPHENHYDRAMINE HCL 25 MG PO CAPS
ORAL_CAPSULE | ORAL | Status: AC
  Filled 2018-04-19: qty 1

## 2018-04-19 MED ORDER — HEPARIN SOD (PORK) LOCK FLUSH 100 UNIT/ML IV SOLN
500.0000 [IU] | Freq: Once | INTRAVENOUS | Status: AC | PRN
  Administered 2018-04-19: 500 [IU]
  Filled 2018-04-19: qty 5

## 2018-04-19 NOTE — Patient Instructions (Signed)
[facility] Discharge Instructions for Patients Receiving Immunotherapy   Today you received the following chemotherapy agents Herceptin   To help prevent nausea and vomiting after your treatment, we encourage you to take your nausea medication as directed.    If you develop nausea and vomiting that is not controlled by your nausea medication, call the clinic.   BELOW ARE SYMPTOMS THAT SHOULD BE REPORTED IMMEDIATELY:  *FEVER GREATER THAN 100.5 F  *CHILLS WITH OR WITHOUT FEVER  NAUSEA AND VOMITING THAT IS NOT CONTROLLED WITH YOUR NAUSEA MEDICATION  *UNUSUAL SHORTNESS OF BREATH  *UNUSUAL BRUISING OR BLEEDING  TENDERNESS IN MOUTH AND THROAT WITH OR WITHOUT PRESENCE OF ULCERS  *URINARY PROBLEMS  *BOWEL PROBLEMS  UNUSUAL RASH Items with * indicate a potential emergency and should be followed up as soon as possible.  Feel free to call the clinic should you have any questions or concerns. The clinic phone number is [phone].  Please show the [facility] at check-in to the Emergency Department and triage nurse.

## 2018-04-19 NOTE — Telephone Encounter (Signed)
Gave avs and calendar ° °

## 2018-04-26 ENCOUNTER — Ambulatory Visit: Payer: Medicare Other

## 2018-04-26 ENCOUNTER — Other Ambulatory Visit: Payer: Medicare Other

## 2018-05-03 ENCOUNTER — Other Ambulatory Visit: Payer: Medicare Other

## 2018-05-03 ENCOUNTER — Ambulatory Visit: Payer: Medicare Other

## 2018-05-09 NOTE — Progress Notes (Signed)
Margaret Hodges  Telephone:(336) [phone] Fax:(336) [phone]     ID: Margaret Hodges DOB: 02/21/1946  MR#: [id number]  ZHY#:[id number]  Patient Care Team: Margaret Jordan, MD as PCP - General (Family Medicine) Margaret Skates, MD as Consulting Physician (General Surgery) Margaret Hodges, Margaret Dad, MD as Consulting Physician (Oncology) Margaret Rudd, MD as Consulting Physician (Radiation Oncology) Margaret Kenner, MD (Dermatology) Margaret Dresser, MD as Consulting Physician (Cardiology) OTHER MD:  CHIEF COMPLAINT: Estrogen receptor positive breast cancer  CURRENT TREATMENT: trastuzumab; adjuvant radiation pending   HISTORY OF CURRENT ILLNESS: From the original intake note:  Margaret Hodges had routine screening mammography on 01/03/2018 showing a possible abnormality in the left breast. She underwent bilateral diagnostic mammography with tomography and left breast ultrasonography at [facility] on 01/15/2018 showing: Breast density category B; a 1.3 cm round solid mass with an indistinct margin in the left breast at 7 o'clock posterior 8 cm from the nipple. No significant abnormalities found in the left axilla.  Accordingly on 01/16/2018 she proceeded to biopsy of the left breast area in question. The pathology from this procedure showed [phone]): Invasive ductal carcinoma, grade III. Prognostic indicators significant for: estrogen receptor, 60% positive, with weak staining intensity; progesterone receptor negative, Proliferation marker Ki67 at 80%. HER2 positive by immunohistochemistry, 3+.  The patient's subsequent history is as detailed below.   INTERVAL HISTORY: Margaret Hodges returns today for follow-up and treatment of her estrogen receptor positive breast cancer.   She continues on trastuzumab. She has noticed that she feels more nauseated during the day; she is treating with Pepcid and omeprazole with good results.   Margaret Hodges's last echocardiogram on 02/06/2018, showed an ejection fraction in  the 60% - 65% range.   Since her last visit here, she has not undergone any additional studies.    REVIEW OF SYSTEMS: Margaret Hodges has arthritis in her hips and across her lumbar region in her back. She had some mouth sores recently, which she treated by rinsing with salt water.  She still has a little bit of neuropathy in her hands particularly when they get cold, but it is better.  For exercise, she tried to walk at least once per day; she also cleans the house and takes care of her husband, who has some medical conditions. The patient denies unusual headaches, visual changes, nausea, vomiting, or dizziness. There has been no unusual cough, phlegm production, or pleurisy. This been no change in bowel or bladder habits. The patient denies unexplained fatigue or unexplained weight loss, bleeding, rash, or fever. A detailed review of systems was otherwise noncontributory.    PAST MEDICAL HISTORY: Past Medical History:  Diagnosis Date  . Anginal pain ([place])    with admission to hospital due to high blood pressure  . Complication of anesthesia   . Family history of ovarian cancer   . GERD (gastroesophageal reflux disease)   . Guillain Barr syndrome ([place]) 1959  . Guillain Barr syndrome ([place]) 1959  . Guillain Barr syndrome ([place]) 1959  . Headache    sinus headaches  . History of hiatal hernia    was told pt. had one, but no testing  . Hypertensive urgency 04/05/2014  . Malignant neoplasm of lower-inner quadrant of left breast in female, estrogen receptor positive ([place]) 01/24/2018  . Neuromuscular disorder ([place])    Guillian- Barre Syndrome  . Pneumonia    hx. of walking pneumonia  . PONV (postoperative nausea and vomiting)     PAST SURGICAL HISTORY: Past Surgical History:  Procedure  Laterality Date  . ABDOMINAL HYSTERECTOMY     Total  . Bladder lift  15 years ago,  Uterus prolapse causing intestinal damage and surgery    . BREAST LUMPECTOMY WITH RADIOACTIVE SEED AND SENTINEL LYMPH NODE  BIOPSY Left 02/18/2018   Procedure: LEFT BREAST LUMPECTOMY WITH RADIOACTIVE SEED AND LEFT AXILLARY DEEP SENTINEL LYMPH NODE BIOPSY, INJECT BLUE DYE LEFT BREAST;  Surgeon: Margaret Skates, MD;  Location: [facility];  Service: General;  Laterality: Left;  . PORTACATH PLACEMENT Right 02/18/2018   Procedure: INSERTION PORT-A-CATH;  Surgeon: Margaret Skates, MD;  Location: [facility];  Service: General;  Laterality: Right;  . Right torn meniscus repair x 2    . THYROID LOBECTOMY N/A 02/04/2015   Procedure: TOTAL THYROIDECTOMY;  Surgeon: Armandina Gemma, MD;  Location: [facility];  Service: General;  Laterality: N/A;  . TONSILLECTOMY    . TUMOR REMOVAL    . Tumors on arms removed,  Tumor on gum removed,  Tumor removed from inside ear      FAMILY HISTORY Family History  Problem Relation Age of Onset  . Ovarian cancer Sister   . Lung cancer Sister   . Throat cancer Brother   . Brain cancer Sister    She notes that her father died from old age at age 68. Patients' mother died from multiple comorbidities at age 19. The patient has 5 brothers and 6 sisters. Patients' sister had ovarian cancer at age 40 and lung cancer at 47, brother with throat cancer at age 9, and sister with brain cancer at age 47.    GYNECOLOGIC HISTORY:  No LMP recorded. Patient has had a hysterectomy. 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 Her husband was a Librarian, academic at [employer].  He now has significant medical issues and the patient is his caregiver her son, Margaret Hodges manages a call center for medical supplies. Her daughter, Margaret Hodges, is Patient Care surgical coordinator at [employer] clinic.  The patient has 2 grandchildren. She attends [employer].      ADVANCED DIRECTIVES: Her [place] is her daughter, Margaret Hodges and can be reached at [phone].     HEALTH MAINTENANCE: Social History   Tobacco Use  . Smoking status: Current Every Day Smoker    Packs/day: 1.50    Types: Cigarettes  . Smokeless tobacco: Never Used  Substance Use Topics  . Alcohol use: No  . Drug use: No     Colonoscopy: Yes through [facility]  PAP:   Bone density:    Allergies  Allergen Reactions  . Allegra [Fexofenadine] Hives  . Clindamycin/Lincomycin Hives and Diarrhea  . Influenza A (H1n1) Monoval Vac Other (See Comments)    Guillian Barre Syndrome  . Keflex [Cephalexin] Hives  . Penicillins Swelling    Has patient had a PCN reaction causing immediate rash, facial/tongue/throat swelling, SOB or lightheadedness with hypotension: No Has patient had a PCN reaction causing severe rash involving mucus membranes or skin necrosis: No Has patient had a PCN reaction that required hospitalization No Has patient had a PCN reaction occurring within the last 10 years: No If all of the above answers are "NO", then may proceed with Cephalosporin use.   . Prednisone Other (See Comments)    "  heart attack symptoms" but even had heart cath & had no cardiac disease  . Protonix [Pantoprazole Sodium] Diarrhea    Tolerates Prilosec  . Statins Other (See Comments)    myalgias    Current Outpatient Medications  Medication Sig Dispense Refill  . amLODipine (NORVASC) 5 MG tablet Take 1 tablet (5 mg total) by mouth daily. 30 tablet 0  . Cholecalciferol (VITAMIN D) 2000 UNITS tablet Take 2,000 Units by mouth daily.    . diclofenac sodium (VOLTAREN) 1 % GEL Apply 4 g topically 4 (four) times daily as needed (arthritis pain).     . famotidine (PEPCID) 20 MG tablet Take 1 tablet (20 mg total) by mouth 2 (two) times daily.    Marland Kitchen HYDROcodone-acetaminophen (NORCO) 5-325 MG tablet Take 1-2 tablets by mouth every 6 (six) hours as needed for moderate pain or  severe pain. 30 tablet 0  . hyoscyamine (LEVBID) 0.375 MG 12 hr tablet Take 0.375 mg by mouth every 12 (twelve) hours as needed (blaader spasms).    Marland Kitchen levothyroxine (SYNTHROID) 88 MCG tablet Take 1 tablet (88 mcg total) by mouth daily before breakfast. 30 tablet 3  . omeprazole (PRILOSEC) 40 MG capsule Take 1 capsule (40 mg total) by mouth daily. 60 capsule 4  . saccharomyces boulardii (FLORASTOR) 250 MG capsule Take 250 mg by mouth daily as needed (only when on antibiotic).     No current facility-administered medications for this visit.     OBJECTIVE: Latest white woman who appears stated age  18:   05/10/18 0940  BP: (!) 144/76  Pulse: 93  Resp: 18  Temp: 97.8 F (36.6 C)  SpO2: 99%     Body mass index is 25.06 kg/m.   Wt Readings from Last 3 Encounters:  05/10/18 164 lb 12.8 oz (74.8 kg)  04/19/18 167 lb 8 oz (76 kg)  04/12/18 167 lb 3.2 oz (75.8 kg)      ECOG FS:1 - Symptomatic but completely ambulatory  Sclerae unicteric, pupils round and equal No cervical or supraclavicular adenopathy Lungs no rales or rhonchi Heart regular rate and rhythm Abd soft, nontender, positive bowel sounds MSK no focal spinal tenderness, no upper extremity lymphedema Neuro: nonfocal, well oriented, appropriate affect Breasts: The right breast is benign.  The left breast has undergone lumpectomy.  There is no evidence of residual or recurrent disease.  Both axillae are benign.   LAB RESULTS:  CMP     Component Value Date/Time   NA 139 04/19/2018 0849   K 4.4 04/19/2018 0849   CL 106 04/19/2018 0849   CO2 24 04/19/2018 0849   GLUCOSE 101 (H) 04/19/2018 0849   BUN 16 04/19/2018 0849   CREATININE 0.76 04/19/2018 0849   CREATININE 0.95 01/30/2018 1205   CALCIUM 9.7 04/19/2018 0849   PROT 7.1 04/19/2018 0849   ALBUMIN 3.8 04/19/2018 0849   AST 13 (L) 04/19/2018 0849   AST 13 (L) 01/30/2018 1205   ALT 25 04/19/2018 0849   ALT 17 01/30/2018 1205   ALKPHOS 85 04/19/2018 0849    BILITOT 0.3 04/19/2018 0849   BILITOT <0.2 (L) 01/30/2018 1205   GFRNONAA >60 04/19/2018 0849   GFRNONAA 59 (L) 01/30/2018 1205   GFRAA >60 04/19/2018 0849   GFRAA >60 01/30/2018 1205    Lab Results  Component Value Date   TOTALPROTELP 7.5 05/08/2017    No results found for: KPAFRELGTCHN, LAMBDASER, KAPLAMBRATIO  Lab Results  Component Value Date   WBC 12.0 (H) 05/10/2018   NEUTROABS  7.9 (H) 05/10/2018   HGB 13.1 05/10/2018   HCT 39.6 05/10/2018   MCV 102.1 (H) 05/10/2018   PLT 412 (H) 05/10/2018    '@LASTCHEMISTRY' @  No results found for: LABCA2  No components found for: [id number]  No results for input(s): INR in the last 168 hours.  No results found for: LABCA2  No results found for: RNH657  No results found for: XUX833  No results found for: XOV291  No results found for: CA2729  No components found for: HGQUANT  No results found for: CEA1 / No results found for: CEA1   No results found for: AFPTUMOR  No results found for: CHROMOGRNA  No results found for: PSA1  Appointment on 05/10/2018  Component Date Value Ref Range Status  . WBC 05/10/2018 12.0* 4.0 - 10.5 K/uL Final  . RBC 05/10/2018 3.88  3.87 - 5.11 MIL/uL Final  . Hemoglobin 05/10/2018 13.1  12.0 - 15.0 g/dL Final  . HCT 05/10/2018 39.6  36.0 - 46.0 % Final  . MCV 05/10/2018 102.1* 80.0 - 100.0 fL Final  . MCH 05/10/2018 33.8  26.0 - 34.0 pg Final  . MCHC 05/10/2018 33.1  30.0 - 36.0 g/dL Final  . RDW 05/10/2018 13.7  11.5 - 15.5 % Final  . Platelets 05/10/2018 412* 150 - 400 K/uL Final  . nRBC 05/10/2018 0.0  0.0 - 0.2 % Final  . Neutrophils Relative % 05/10/2018 66  % Final  . Neutro Abs 05/10/2018 7.9* 1.7 - 7.7 K/uL Final  . Lymphocytes Relative 05/10/2018 23  % Final  . Lymphs Abs 05/10/2018 2.8  0.7 - 4.0 K/uL Final  . Monocytes Relative 05/10/2018 7  % Final  . Monocytes Absolute 05/10/2018 0.8  0.1 - 1.0 K/uL Final  . Eosinophils Relative 05/10/2018 3  % Final  . Eosinophils  Absolute 05/10/2018 0.3  0.0 - 0.5 K/uL Final  . Basophils Relative 05/10/2018 1  % Final  . Basophils Absolute 05/10/2018 0.1  0.0 - 0.1 K/uL Final  . Immature Granulocytes 05/10/2018 0  % Final  . Abs Immature Granulocytes 05/10/2018 0.05  0.00 - 0.07 K/uL Final   Performed at [facility], [address] [place], [place] [address]    (this displays the last labs from the last 3 days)  Lab Results  Component Value Date   TOTALPROTELP 7.5 05/08/2017   (this displays SPEP labs)  No results found for: KPAFRELGTCHN, LAMBDASER, KAPLAMBRATIO (kappa/lambda light chains)  No results found for: HGBA, HGBA2QUANT, HGBFQUANT, HGBSQUAN (Hemoglobinopathy evaluation)   Lab Results  Component Value Date   LDH 171 05/08/2017    No results found for: IRON, TIBC, IRONPCTSAT (Iron and TIBC)  No results found for: FERRITIN  Urinalysis    Component Value Date/Time   COLORURINE YELLOW 04/04/2014 2112   APPEARANCEUR CLEAR 04/04/2014 2112   LABSPEC 1.017 04/04/2014 2112   PHURINE 6.0 04/04/2014 2112   GLUCOSEU NEGATIVE 04/04/2014 2112   HGBUR NEGATIVE 04/04/2014 2112   BILIRUBINUR NEGATIVE 04/04/2014 2112   [place] NEGATIVE 04/04/2014 2112   PROTEINUR NEGATIVE 04/04/2014 2112   UROBILINOGEN 0.2 04/04/2014 2112   NITRITE NEGATIVE 04/04/2014 2112   LEUKOCYTESUR NEGATIVE 04/04/2014 2112     STUDIES:  Next echocardiogram scheduled for 06/06/2018   ELIGIBLE FOR AVAILABLE RESEARCH PROTOCOL: no   ASSESSMENT: 73 y.o. [place] woman status post left breast lower inner quadrant biopsy 01/16/2018 for a clinical T1c N0, stage IA invasive ductal carcinoma, grade 3, estrogen receptor weakly positive, progesterone receptor negative,  with an MIB-1 of 80%, and HER-2 amplified  (1) genetics 03/06/2018 showed no deleterious mutations  (2) status post left lumpectomy and sentinel lymph node sampling 02/18/2018 for a pT2 pN0, stage IIA invasive ductal carcinoma, grade 3,  with negative margins.  (a) a total of 5 lymph nodes were removed   (3) adjuvant chemo immunotherapy consisting of paclitaxel/trastuzumab weekly for 12 weeks, started 03/08/2018.  (a) paclitaxel discontinued after 6 doses because of peripheral neuropathy; last dose 04/12/2018  (4) trastuzumab to be continued to complete a year  (a) echocardiogram 02/06/2018 shows an ejection fraction in the 60-65% range  (5) adjuvant radiation pending  (6) antiestrogens to follow at the completion of local treatment.  (a) bone density at [facility] 12/27/2017 shows a T score of -1.7  (7) tobacco abuse disorder: The patient has been strongly advised to discontinue smoking   PLAN: Maude still has a little bit of peripheral neuropathy particularly in her hands and a little bit on her feet.  It is worse with cold.  Hopefully this will continue to improve.  The good news is that it does not interfere with her activities of daily living.  She is very stressed because of her husband's declining health.  Nevertheless she is doing her best to exercise on a daily basis  She is now ready for adjuvant radiation.  Hopefully this can be started within the next week or 2.  We will continue the trastuzumab right through the radiation treatments and she will see me again in about 9 weeks at which time we will likely start an antiestrogen  She knows to call for any other issues that may develop before the next visit.  Alyssa Mancera, Margaret Dad, MD  05/10/18 10:12 AM Medical Oncology and Hematology [facility] [address] [place], [place] [address] Tel. [phone]    Fax. [phone]  I, Jacqualyn Posey am acting as a Education administrator for Chauncey Cruel, MD.  I, Lurline Del MD, have reviewed the above documentation for accuracy and completeness, and I agree with the above.

## 2018-05-10 ENCOUNTER — Ambulatory Visit: Payer: Medicare Other

## 2018-05-10 ENCOUNTER — Inpatient Hospital Stay: Payer: Medicare Other | Attending: Oncology

## 2018-05-10 ENCOUNTER — Telehealth: Payer: Self-pay | Admitting: Oncology

## 2018-05-10 ENCOUNTER — Inpatient Hospital Stay (HOSPITAL_BASED_OUTPATIENT_CLINIC_OR_DEPARTMENT_OTHER): Payer: Medicare Other | Admitting: Oncology

## 2018-05-10 ENCOUNTER — Inpatient Hospital Stay: Payer: Medicare Other

## 2018-05-10 ENCOUNTER — Other Ambulatory Visit: Payer: Medicare Other

## 2018-05-10 VITALS — BP 144/76 | HR 93 | Temp 97.8°F | Resp 18 | Ht 68.0 in | Wt 164.8 lb

## 2018-05-10 DIAGNOSIS — G620 Drug-induced polyneuropathy: Secondary | ICD-10-CM

## 2018-05-10 DIAGNOSIS — F17210 Nicotine dependence, cigarettes, uncomplicated: Secondary | ICD-10-CM | POA: Insufficient documentation

## 2018-05-10 DIAGNOSIS — Z9221 Personal history of antineoplastic chemotherapy: Secondary | ICD-10-CM

## 2018-05-10 DIAGNOSIS — Z170 Estrogen receptor positive status [ER+]: Secondary | ICD-10-CM | POA: Diagnosis not present

## 2018-05-10 DIAGNOSIS — Z95828 Presence of other vascular implants and grafts: Secondary | ICD-10-CM

## 2018-05-10 DIAGNOSIS — Z5112 Encounter for antineoplastic immunotherapy: Secondary | ICD-10-CM | POA: Diagnosis not present

## 2018-05-10 DIAGNOSIS — Z9222 Personal history of monoclonal drug therapy: Secondary | ICD-10-CM | POA: Diagnosis not present

## 2018-05-10 DIAGNOSIS — T451X5A Adverse effect of antineoplastic and immunosuppressive drugs, initial encounter: Secondary | ICD-10-CM | POA: Diagnosis not present

## 2018-05-10 DIAGNOSIS — C50312 Malignant neoplasm of lower-inner quadrant of left female breast: Secondary | ICD-10-CM | POA: Diagnosis not present

## 2018-05-10 DIAGNOSIS — Z720 Tobacco use: Secondary | ICD-10-CM

## 2018-05-10 LAB — COMPREHENSIVE METABOLIC PANEL
ALBUMIN: 3.7 g/dL (ref 3.5–5.0)
ALT: 14 U/L (ref 0–44)
AST: 11 U/L — ABNORMAL LOW (ref 15–41)
Alkaline Phosphatase: 109 U/L (ref 38–126)
Anion gap: 11 (ref 5–15)
BILIRUBIN TOTAL: 0.2 mg/dL — AB (ref 0.3–1.2)
BUN: 16 mg/dL (ref 8–23)
CO2: 23 mmol/L (ref 22–32)
Calcium: 9.9 mg/dL (ref 8.9–10.3)
Chloride: 106 mmol/L (ref 98–111)
Creatinine, Ser: 0.83 mg/dL (ref 0.44–1.00)
GFR calc Af Amer: >60 mL/min (ref >60)
GFR calc non Af Amer: >60 mL/min (ref >60)
Glucose, Bld: 103 mg/dL — ABNORMAL HIGH (ref 70–99)
Potassium: 4.3 mmol/L (ref 3.5–5.1)
SODIUM: 140 mmol/L (ref 135–145)
Total Protein: 7.4 g/dL (ref 6.5–8.1)

## 2018-05-10 LAB — CBC WITH DIFFERENTIAL/PLATELET
Abs Immature Granulocytes: 0.05 10*3/uL (ref 0.00–0.07)
Basophils Absolute: 0.1 10*3/uL (ref 0.0–0.1)
Basophils Relative: 1 %
Eosinophils Absolute: 0.3 10*3/uL (ref 0.0–0.5)
Eosinophils Relative: 3 %
HEMATOCRIT: 39.6 % (ref 36.0–46.0)
HEMOGLOBIN: 13.1 g/dL (ref 12.0–15.0)
Immature Granulocytes: 0 %
Lymphocytes Relative: 23 %
Lymphs Abs: 2.8 10*3/uL (ref 0.7–4.0)
MCH: 33.8 pg (ref 26.0–34.0)
MCHC: 33.1 g/dL (ref 30.0–36.0)
MCV: 102.1 fL — ABNORMAL HIGH (ref 80.0–100.0)
Monocytes Absolute: 0.8 10*3/uL (ref 0.1–1.0)
Monocytes Relative: 7 %
Neutro Abs: 7.9 10*3/uL — ABNORMAL HIGH (ref 1.7–7.7)
Neutrophils Relative %: 66 %
Platelets: 412 10*3/uL — ABNORMAL HIGH (ref 150–400)
RBC: 3.88 MIL/uL (ref 3.87–5.11)
RDW: 13.7 % (ref 11.5–15.5)
WBC: 12 10*3/uL — ABNORMAL HIGH (ref 4.0–10.5)
nRBC: 0 % (ref 0.0–0.2)

## 2018-05-10 MED ORDER — DIPHENHYDRAMINE HCL 25 MG PO CAPS
ORAL_CAPSULE | ORAL | Status: AC
  Filled 2018-05-10: qty 1

## 2018-05-10 MED ORDER — OMEPRAZOLE 40 MG PO CPDR: 40.0000 mg | DELAYED_RELEASE_CAPSULE | Freq: Every day | ORAL | 4 refills | Status: DC

## 2018-05-10 MED ORDER — ACETAMINOPHEN 325 MG PO TABS
650.0000 mg | ORAL_TABLET | Freq: Once | ORAL | Status: AC
  Administered 2018-05-10: 650 mg via ORAL

## 2018-05-10 MED ORDER — DIPHENHYDRAMINE HCL 25 MG PO CAPS
25.0000 mg | ORAL_CAPSULE | Freq: Once | ORAL | Status: AC
  Administered 2018-05-10: 25 mg via ORAL

## 2018-05-10 MED ORDER — SODIUM CHLORIDE 0.9% FLUSH
10.0000 mL | INTRAVENOUS | Status: DC | PRN
  Administered 2018-05-10: 10 mL
  Filled 2018-05-10: qty 10

## 2018-05-10 MED ORDER — ACETAMINOPHEN 325 MG PO TABS
ORAL_TABLET | ORAL | Status: AC
  Filled 2018-05-10: qty 2

## 2018-05-10 MED ORDER — HEPARIN SOD (PORK) LOCK FLUSH 100 UNIT/ML IV SOLN
500.0000 [IU] | Freq: Once | INTRAVENOUS | Status: AC | PRN
  Administered 2018-05-10: 500 [IU]
  Filled 2018-05-10: qty 5

## 2018-05-10 MED ORDER — SODIUM CHLORIDE 0.9 % IV SOLN
Freq: Once | INTRAVENOUS | Status: AC
  Administered 2018-05-10: 12:00:00 via INTRAVENOUS
  Filled 2018-05-10: qty 250

## 2018-05-10 MED ORDER — TRASTUZUMAB CHEMO 150 MG IV SOLR
450.0000 mg | Freq: Once | INTRAVENOUS | Status: AC
  Administered 2018-05-10: 450 mg via INTRAVENOUS
  Filled 2018-05-10: qty 21.43

## 2018-05-10 NOTE — Progress Notes (Signed)
Pt reports that she has an ECHO scheduled in March 2020

## 2018-05-10 NOTE — Patient Instructions (Signed)
[facility] Discharge Instructions for Patients Receiving Immunotherapy   Today you received the following chemotherapy agents Herceptin   To help prevent nausea and vomiting after your treatment, we encourage you to take your nausea medication as directed.    If you develop nausea and vomiting that is not controlled by your nausea medication, call the clinic.   BELOW ARE SYMPTOMS THAT SHOULD BE REPORTED IMMEDIATELY:  *FEVER GREATER THAN 100.5 F  *CHILLS WITH OR WITHOUT FEVER  NAUSEA AND VOMITING THAT IS NOT CONTROLLED WITH YOUR NAUSEA MEDICATION  *UNUSUAL SHORTNESS OF BREATH  *UNUSUAL BRUISING OR BLEEDING  TENDERNESS IN MOUTH AND THROAT WITH OR WITHOUT PRESENCE OF ULCERS  *URINARY PROBLEMS  *BOWEL PROBLEMS  UNUSUAL RASH Items with * indicate a potential emergency and should be followed up as soon as possible.  Feel free to call the clinic should you have any questions or concerns. The clinic phone number is [phone]) [phone].  Please show the [place] [place] at check-in to the Emergency Department and triage nurse.

## 2018-05-10 NOTE — Telephone Encounter (Signed)
Gave avs and calendar ° °

## 2018-05-17 ENCOUNTER — Other Ambulatory Visit: Payer: Medicare Other

## 2018-05-17 ENCOUNTER — Ambulatory Visit: Payer: Medicare Other

## 2018-05-22 ENCOUNTER — Ambulatory Visit
Admission: RE | Admit: 2018-05-22 | Discharge: 2018-05-22 | Disposition: A | Discharge status: Home / Self Care | Payer: Medicare Other | Source: Ambulatory Visit | Attending: Radiation Oncology | Admitting: Radiation Oncology

## 2018-05-22 ENCOUNTER — Other Ambulatory Visit: Payer: Self-pay

## 2018-05-22 ENCOUNTER — Encounter: Payer: Self-pay | Admitting: Radiation Oncology

## 2018-05-22 VITALS — BP 136/70 | HR 82 | Temp 97.6°F | Resp 18 | Wt 166.4 lb

## 2018-05-22 DIAGNOSIS — K219 Gastro-esophageal reflux disease without esophagitis: Secondary | ICD-10-CM | POA: Insufficient documentation

## 2018-05-22 DIAGNOSIS — Z79899 Other long term (current) drug therapy: Secondary | ICD-10-CM | POA: Diagnosis not present

## 2018-05-22 DIAGNOSIS — Z170 Estrogen receptor positive status [ER+]: Secondary | ICD-10-CM | POA: Insufficient documentation

## 2018-05-22 DIAGNOSIS — G629 Polyneuropathy, unspecified: Secondary | ICD-10-CM | POA: Insufficient documentation

## 2018-05-22 DIAGNOSIS — Z803 Family history of malignant neoplasm of breast: Secondary | ICD-10-CM | POA: Diagnosis not present

## 2018-05-22 DIAGNOSIS — C50312 Malignant neoplasm of lower-inner quadrant of left female breast: Secondary | ICD-10-CM | POA: Diagnosis not present

## 2018-05-22 DIAGNOSIS — R51 Headache: Secondary | ICD-10-CM | POA: Diagnosis not present

## 2018-05-22 DIAGNOSIS — Z9889 Other specified postprocedural states: Secondary | ICD-10-CM | POA: Diagnosis not present

## 2018-05-22 DIAGNOSIS — K449 Diaphragmatic hernia without obstruction or gangrene: Secondary | ICD-10-CM | POA: Diagnosis not present

## 2018-05-22 DIAGNOSIS — Z510 Encounter for antineoplastic radiation therapy: Secondary | ICD-10-CM | POA: Diagnosis not present

## 2018-05-22 DIAGNOSIS — F17210 Nicotine dependence, cigarettes, uncomplicated: Secondary | ICD-10-CM | POA: Insufficient documentation

## 2018-05-22 DIAGNOSIS — Z9221 Personal history of antineoplastic chemotherapy: Secondary | ICD-10-CM | POA: Diagnosis not present

## 2018-05-22 NOTE — Progress Notes (Signed)
  Radiation Oncology         [phone] ________________________________  Name: Margaret Hodges MRN: [id number]  Date: 05/22/2018  DOB: Sep 15, 1945   DIAGNOSIS:     ICD-10-CM   1. Malignant neoplasm of lower-inner quadrant of left breast in female, estrogen receptor positive ([place]) C50.312    Z17.0     SIMULATION AND TREATMENT PLANNING NOTE  The patient presented for simulation prior to beginning her course of radiation treatment for her diagnosis of left-sided breast cancer. The patient was placed in a supine position on a breast board. A customized vac-lock bag was constructed and this complex treatment device will be used on a daily basis during her treatment. In this fashion, a CT scan was obtained through the chest area and an isocenter was placed near the chest wall within the breast.  The patient will be planned to receive a course of radiation initially to a dose of 42.56 Gy. This will consist of a whole breast radiotherapy technique. To accomplish this, 2 customized blocks have been designed which will correspond to medial and lateral whole breast tangent fields. This treatment will be accomplished at 2.66 Gy per fraction. A forward planning technique will also be evaluated to determine if this approach improves the plan. It is anticipated that the patient will then receive a 8 Gy boost to the seroma cavity which has been contoured. This will be accomplished at 2 Gy per fraction.   This initial treatment will consist of a 3-D conformal technique. The seroma has been contoured as the primary target structure. Additionally, dose volume histograms of both this target as well as the lungs and heart will also be evaluated. Such an approach is necessary to ensure that the target area is adequately covered while the nearby critical  normal structures are adequately spared.  Plan:  The final anticipated total dose therefore will correspond to 50.56 Gy.  Special treatment procedure was  performed today due to the extra time and effort required by myself to plan and prepare this patient for deep inspiration breath hold technique.  I have determined cardiac sparing to be of benefit to this patient to prevent long term cardiac damage due to radiation of the heart.  Bellows were placed on the patient's abdomen. To facilitate cardiac sparing, the patient was coached by the radiation therapists on breath hold techniques and breathing practice was performed. Practice waveforms were obtained. The patient was then scanned while maintaining breath hold in the treatment position.  This image was then transferred over to the imaging specialist. The imaging specialist then created a fusion of the free breathing and breath hold scans using the chest wall as the stable structure. I personally reviewed the fusion in axial, coronal and sagittal image planes.  Excellent cardiac sparing was obtained.  I felt the patient is an appropriate candidate for breath hold and the patient will be treated as such.  The image fusion was then reviewed with the patient to reinforce the necessity of reproducible breath hold.     _______________________________   Jodelle Gross, MD, PhD

## 2018-05-22 NOTE — Progress Notes (Signed)
Radiation Oncology         [phone] ________________________________  Name: Margaret Hodges        MRN: [id number]  Date of Service: 05/22/2018 DOB: Sep 16, 1945  CC:[name], MD  Wyatt Portela, MD     REFERRING PHYSICIAN: Wyatt Portela, MD   DIAGNOSIS: The encounter diagnosis was Malignant neoplasm of lower-inner quadrant of left breast in female, estrogen receptor positive ([place]).   HISTORY OF PRESENT ILLNESS: Margaret Hodges is a 73 y.o. female originally seen in the multidisciplinary breast clinic for a  diagnosis of left breast cancer. The patient was noted to have a screening detected mass in the left breast.  This prompted additional diagnostic imaging which revealed a 13 mm mass at the 7 o'clock position in the left breast, her axilla was negative for adenopathy.  She underwent a biopsy on 01/16/2018 which revealed a grade 3 invasive ductal carcinoma, ER positive, PR negative, HER-2 amplified with a Ki-67 of 80%.  She proceeded with lumpectomy on 02/18/18 and this revealed a 2.7 cm grade 3 invasive ductal carcinoma and her margins were negative, with 5 sampled nodes that were all negative. She went on begin Taxol/Herceptin on 03/08/18 and completed the Taxol on 04/12/2018. This was discontinued due to neuropathy. She continues with Herceptin and comes today to discuss adjuvant radiotherapy.   PREVIOUS RADIATION THERAPY: No   PAST MEDICAL HISTORY:  Past Medical History:  Diagnosis Date  . Anginal pain ([place])    with admission to hospital due to high blood pressure  . Complication of anesthesia   . Family history of ovarian cancer   . GERD (gastroesophageal reflux disease)   . Guillain Barr syndrome ([place]) 1959  . Guillain Barr syndrome ([place]) 1959  . Guillain Barr syndrome ([place]) 1959  . Headache    sinus headaches  . History of hiatal hernia    was told pt. had one, but no testing  . Hypertensive urgency 04/05/2014  . Malignant neoplasm of lower-inner quadrant of  left breast in female, estrogen receptor positive ([place]) 01/24/2018  . Neuromuscular disorder ([place])    Guillian- Barre Syndrome  . Pneumonia    hx. of walking pneumonia  . PONV (postoperative nausea and vomiting)        PAST SURGICAL HISTORY: Past Surgical History:  Procedure Laterality Date  . ABDOMINAL HYSTERECTOMY     Total  . Bladder lift  15 years ago,  Uterus prolapse causing intestinal damage and surgery    . BREAST LUMPECTOMY WITH RADIOACTIVE SEED AND SENTINEL LYMPH NODE BIOPSY Left 02/18/2018   Procedure: LEFT BREAST LUMPECTOMY WITH RADIOACTIVE SEED AND LEFT AXILLARY DEEP SENTINEL LYMPH NODE BIOPSY, INJECT BLUE DYE LEFT BREAST;  Surgeon: Fanny Skates, MD;  Location: [facility];  Service: General;  Laterality: Left;  . PORTACATH PLACEMENT Right 02/18/2018   Procedure: INSERTION PORT-A-CATH;  Surgeon: Fanny Skates, MD;  Location: [facility];  Service: General;  Laterality: Right;  . Right torn meniscus repair x 2    . THYROID LOBECTOMY N/A 02/04/2015   Procedure: TOTAL THYROIDECTOMY;  Surgeon: Armandina Gemma, MD;  Location: [facility];  Service: General;  Laterality: N/A;  . TONSILLECTOMY    . TUMOR REMOVAL    . Tumors on arms removed,  Tumor on gum removed,  Tumor removed from inside ear       FAMILY HISTORY:  Family History  Problem Relation Age of Onset  . Ovarian cancer Sister   . Lung cancer  Sister   . Throat cancer Brother   . Brain cancer Sister      SOCIAL HISTORY:  reports that she has been smoking cigarettes. She has been smoking about 1.50 packs per day. She has never used smokeless tobacco. She reports that she does not drink alcohol or use drugs.  The patient is married and lives in [place]. She is accompanied by her daughter. 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    ALLERGIES: Allegra [fexofenadine]; Clindamycin/lincomycin; Influenza a (h1n1) monoval vac; Keflex [cephalexin]; Penicillins; Prednisone;  Protonix [pantoprazole sodium]; and Statins   MEDICATIONS:  Current Outpatient Medications  Medication Sig Dispense Refill  . amLODipine (NORVASC) 5 MG tablet Take 1 tablet (5 mg total) by mouth daily. 30 tablet 0  . Cholecalciferol (VITAMIN D) 2000 UNITS tablet Take 2,000 Units by mouth daily.    . diclofenac sodium (VOLTAREN) 1 % GEL Apply 4 g topically 4 (four) times daily as needed (arthritis pain).     . famotidine (PEPCID) 20 MG tablet Take 1 tablet (20 mg total) by mouth 2 (two) times daily.    Marland Kitchen HYDROcodone-acetaminophen (NORCO) 5-325 MG tablet Take 1-2 tablets by mouth every 6 (six) hours as needed for moderate pain or severe pain. 30 tablet 0  . hyoscyamine (LEVBID) 0.375 MG 12 hr tablet Take 0.375 mg by mouth every 12 (twelve) hours as needed (blaader spasms).    Marland Kitchen levothyroxine (SYNTHROID) 88 MCG tablet Take 1 tablet (88 mcg total) by mouth daily before breakfast. 30 tablet 3  . omeprazole (PRILOSEC) 40 MG capsule Take 1 capsule (40 mg total) by mouth daily. 60 capsule 4  . saccharomyces boulardii (FLORASTOR) 250 MG capsule Take 250 mg by mouth daily as needed (only when on antibiotic).     No current facility-administered medications for this visit.      REVIEW OF SYSTEMS: On review of systems, the patient reports that she is doing well overall. She does have some neuropathy due to her chemotherapy. She denies any chest pain, shortness of breath, cough, fevers, chills, night sweats, unintended weight changes. She denies any bowel or bladder disturbances, and denies abdominal pain, nausea or vomiting. She denies any new musculoskeletal or joint aches or pains. A complete review of systems is obtained and is otherwise negative.     PHYSICAL EXAM:  Wt Readings from Last 3 Encounters:  05/10/18 164 lb 12.8 oz (74.8 kg)  04/19/18 167 lb 8 oz (76 kg)  04/12/18 167 lb 3.2 oz (75.8 kg)   Temp Readings from Last 3 Encounters:  05/10/18 97.8 F (36.6 C) (Oral)  04/19/18 97.7 F  (36.5 C) (Oral)  04/12/18 97.6 F (36.4 C) (Oral)   BP Readings from Last 3 Encounters:  05/10/18 (!) 144/76  04/19/18 136/79  04/12/18 128/79   Pulse Readings from Last 3 Encounters:  05/10/18 93  04/19/18 85  04/12/18 75     In general this is a well appearing Caucasian female in no acute distress. She is alert and oriented x4 and appropriate throughout the examination. HEENT reveals that the patient is normocephalic, atraumatic. EOMs are intact. Skin is intact without any evidence of gross lesions. Cardiopulmonary assessment is negative for acute distress and she exhibits normal effort. Her left breast reveals a well healed lumpectomy site and axillary incision site. No fluctuance is noted.   ECOG = 0  0 - Asymptomatic (Fully active, able to carry on all predisease activities without restriction)  1 - Symptomatic but completely ambulatory (  Restricted in physically strenuous activity but ambulatory and able to carry out work of a light or sedentary nature. For example, light housework, office work)  2 - Symptomatic, <50% in bed during the day (Ambulatory and capable of all self care but unable to carry out any work activities. Up and about more than 50% of waking hours)  3 - Symptomatic, >50% in bed, but not bedbound (Capable of only limited self-care, confined to bed or chair 50% or more of waking hours)  4 - Bedbound (Completely disabled. Cannot carry on any self-care. Totally confined to bed or chair)  5 - Death   Eustace Pen MM, Creech RH, Tormey DC, et al. [phone]). "Toxicity and response criteria of the [facility]". [place] Oncol. 5 (6): 649-55    LABORATORY DATA:  Lab Results  Component Value Date   WBC 12.0 (H) 05/10/2018   HGB 13.1 05/10/2018   HCT 39.6 05/10/2018   MCV 102.1 (H) 05/10/2018   PLT 412 (H) 05/10/2018   Lab Results  Component Value Date   NA 140 05/10/2018   K 4.3 05/10/2018   CL 106 05/10/2018   CO2 23 05/10/2018    Lab Results  Component Value Date   ALT 14 05/10/2018   AST 11 (L) 05/10/2018   ALKPHOS 109 05/10/2018   BILITOT 0.2 (L) 05/10/2018      RADIOGRAPHY: No results found.     IMPRESSION/PLAN: 1. Stage IIA, pT2N0M0, grade 3 ER positive, HER2 amplified invasive ductal carcinoma of the left breast. Dr. Lisbeth Renshaw discusses the final surgical pathology findings and reviews the nature of left breast disease. She has done well since her surgery and with the completion of chemotherapy. She is ready to proceed with adjuvant radiotherapy. We discussed the risks, benefits, short, and long term effects of radiotherapy, and the patient is interested in proceeding. Dr. Lisbeth Renshaw discusses the delivery and logistics of radiotherapy and anticipates a course of 4 weeks of radiotherapy with deep inspiration breath-hold technique. Written consent is obtained and placed in the chart, a copy was provided to the patient.    In a visit lasting 25 minutes, greater than 50% of the time was spent face to face discussing her case, and coordinating the patient's care.   The above documentation reflects my direct findings during this shared patient visit. Please see the separate note by Dr. Lisbeth Renshaw on this date for the remainder of the patient's plan of care.    Carola Rhine, PAC

## 2018-05-22 NOTE — Progress Notes (Signed)
  Radiation Oncology         [phone] ________________________________  Name: Margaret Hodges MRN: [id number]  Date: 05/22/2018  DOB: 1945-08-21  Optical Surface Tracking Plan:  Since intensity modulated radiotherapy (IMRT) and 3D conformal radiation treatment methods are predicated on accurate and precise positioning for treatment, intrafraction motion monitoring is medically necessary to ensure accurate and safe treatment delivery.  The ability to quantify intrafraction motion without excessive ionizing radiation dose can only be performed with optical surface tracking. Accordingly, surface imaging offers the opportunity to obtain 3D measurements of patient position throughout IMRT and 3D treatments without excessive radiation exposure.  I am ordering optical surface tracking for this patient's upcoming course of radiotherapy. ________________________________  Kyung Rudd, MD 05/22/2018 5:03 PM    Reference:   Particia Jasper, et al. Surface imaging-based analysis of intrafraction motion for breast radiotherapy patients.Journal of [place], n. 6, nov. 2014. ISSN [id number].   Available at: <http://www.jacmp.org/index.php/jacmp/article/view/4957>.

## 2018-05-22 NOTE — Progress Notes (Signed)
Location of Breast Cancer: Left Breast  Histology per Pathology Report: 02/18/18  Diagnosis 1. Breast, lumpectomy, left - INVASIVE DUCTAL CARCINOMA, MULTIFOCAL, LARGER TUMOR MEASURES 2.7 CM, NOTTINGHAM GRADE III OF III. - MARGINS OF RESECTION ARE NOT INVOLVED (CLOSEST MARGIN: 7 MM, INFERIOR). - BIOPSY SITE CHANGES. - SEE ONCOLOGY TABLE. 2. Lymph node, sentinel, biopsy, Left - ONE LYMPH NODE, NEGATIVE FOR CARCINOMA (0/1). 3. Lymph node, sentinel, biopsy, left - ONE LYMPH NODE, NEGATIVE FOR CARCINOMA (0/1). 4. Lymph node, sentinel, biopsy, left - ONE LYMPH NODE, NEGATIVE FOR CARCINOMA (0/1). 5. Lymph node, sentinel, biopsy, Left - ONE LYMPH NODE, NEGATIVE FOR CARCINOMA (0/1). 6. Lymph node, sentinel, biopsy, left - ONE LYMPH NODE, NEGATIVE FOR CARCINOMA (0/ Receptor Status:05/10/18  Estrogen Receptor: 60%, positive, weak Progesterone Receptor: 0%, negative HER2: Positive (3+) Ki-67: 80%  Did patient present with symptoms (if so, please note symptoms) or was this found on screening mammography?: Yes  Past/Anticipated interventions by surgeon, if any:02/18/18 Dr Fanny Skates  LEFT BREAST LUMPECTOMY [place], INJECT BLUE DYE LEFT BREAST    INSERTION PORT-A-CATH      Past/Anticipated interventions by medical oncology, if any: Chemotherapy   Lymphedema issues, if any:  None Pain issues, if any:  Patient states that she has sharp shooting pains in the breast area sometime  SAFETY ISSUES:  Prior radiation? No  Pacemaker/ICD? No  Possible current pregnancy? No  Is the patient on methotrexate? No  Current Complaints / other details:     Mardene Sayer, LPN 2/54/8628,2:41 AM Vitals:   05/22/18 0859  BP: 136/70  Pulse: 82  Resp: 18  Temp: 97.6 F (36.4 C)  TempSrc: Oral  SpO2: 100%  Weight: 166 lb 6 oz (75.5 kg)   Wt Readings from Last 3 Encounters:  05/22/18 166 lb 6 oz (75.5 kg)  05/22/18 166 lb 9.6  oz (75.6 kg)  05/10/18 164 lb 12.8 oz (74.8 kg)

## 2018-05-24 ENCOUNTER — Other Ambulatory Visit: Payer: Medicare Other

## 2018-05-24 ENCOUNTER — Ambulatory Visit: Payer: Medicare Other

## 2018-05-28 DIAGNOSIS — Z170 Estrogen receptor positive status [ER+]: Secondary | ICD-10-CM | POA: Diagnosis not present

## 2018-05-28 DIAGNOSIS — Z510 Encounter for antineoplastic radiation therapy: Secondary | ICD-10-CM | POA: Insufficient documentation

## 2018-05-28 DIAGNOSIS — C50312 Malignant neoplasm of lower-inner quadrant of left female breast: Secondary | ICD-10-CM | POA: Insufficient documentation

## 2018-05-30 ENCOUNTER — Ambulatory Visit
Admission: RE | Admit: 2018-05-30 | Discharge: 2018-05-30 | Disposition: A | Discharge status: Home / Self Care | Payer: Medicare Other | Source: Ambulatory Visit | Attending: Radiation Oncology | Admitting: Radiation Oncology

## 2018-05-30 DIAGNOSIS — Z510 Encounter for antineoplastic radiation therapy: Secondary | ICD-10-CM | POA: Diagnosis not present

## 2018-05-30 DIAGNOSIS — C50312 Malignant neoplasm of lower-inner quadrant of left female breast: Secondary | ICD-10-CM | POA: Diagnosis not present

## 2018-05-30 DIAGNOSIS — Z170 Estrogen receptor positive status [ER+]: Secondary | ICD-10-CM | POA: Diagnosis not present

## 2018-05-31 ENCOUNTER — Inpatient Hospital Stay: Payer: Medicare Other

## 2018-05-31 ENCOUNTER — Ambulatory Visit
Admission: RE | Admit: 2018-05-31 | Discharge: 2018-05-31 | Disposition: A | Discharge status: Home / Self Care | Payer: Medicare Other | Source: Ambulatory Visit | Attending: Radiation Oncology | Admitting: Radiation Oncology

## 2018-05-31 ENCOUNTER — Inpatient Hospital Stay: Payer: Medicare Other | Attending: Oncology

## 2018-05-31 VITALS — BP 123/73 | HR 74 | Temp 98.0°F | Resp 16

## 2018-05-31 DIAGNOSIS — C50312 Malignant neoplasm of lower-inner quadrant of left female breast: Secondary | ICD-10-CM | POA: Diagnosis not present

## 2018-05-31 DIAGNOSIS — Z5112 Encounter for antineoplastic immunotherapy: Secondary | ICD-10-CM | POA: Insufficient documentation

## 2018-05-31 DIAGNOSIS — Z170 Estrogen receptor positive status [ER+]: Principal | ICD-10-CM

## 2018-05-31 DIAGNOSIS — Z95828 Presence of other vascular implants and grafts: Secondary | ICD-10-CM

## 2018-05-31 DIAGNOSIS — Z510 Encounter for antineoplastic radiation therapy: Secondary | ICD-10-CM | POA: Diagnosis not present

## 2018-05-31 DIAGNOSIS — Z720 Tobacco use: Secondary | ICD-10-CM

## 2018-05-31 LAB — CBC WITH DIFFERENTIAL/PLATELET
Abs Immature Granulocytes: 0.03 10*3/uL (ref 0.00–0.07)
Basophils Absolute: 0.1 10*3/uL (ref 0.0–0.1)
Basophils Relative: 1 %
Eosinophils Absolute: 0.3 10*3/uL (ref 0.0–0.5)
Eosinophils Relative: 3 %
HCT: 41.1 % (ref 36.0–46.0)
Hemoglobin: 13.5 g/dL (ref 12.0–15.0)
Immature Granulocytes: 0 %
Lymphocytes Relative: 23 %
Lymphs Abs: 2.5 10*3/uL (ref 0.7–4.0)
MCH: 33.6 pg (ref 26.0–34.0)
MCHC: 32.8 g/dL (ref 30.0–36.0)
MCV: 102.2 fL — ABNORMAL HIGH (ref 80.0–100.0)
Monocytes Absolute: 0.9 10*3/uL (ref 0.1–1.0)
Monocytes Relative: 8 %
Neutro Abs: 7 10*3/uL (ref 1.7–7.7)
Neutrophils Relative %: 65 %
Platelets: 297 10*3/uL (ref 150–400)
RBC: 4.02 MIL/uL (ref 3.87–5.11)
RDW: 13.6 % (ref 11.5–15.5)
WBC: 10.7 10*3/uL — ABNORMAL HIGH (ref 4.0–10.5)
nRBC: 0 % (ref 0.0–0.2)

## 2018-05-31 LAB — COMPREHENSIVE METABOLIC PANEL
ALT: 19 U/L (ref 0–44)
AST: 16 U/L (ref 15–41)
Albumin: 4.1 g/dL (ref 3.5–5.0)
Alkaline Phosphatase: 97 U/L (ref 38–126)
Anion gap: 10 (ref 5–15)
BUN: 13 mg/dL (ref 8–23)
CO2: 22 mmol/L (ref 22–32)
Calcium: 9.5 mg/dL (ref 8.9–10.3)
Chloride: 106 mmol/L (ref 98–111)
Creatinine, Ser: 0.76 mg/dL (ref 0.44–1.00)
GFR calc Af Amer: >60 mL/min (ref >60)
GFR calc non Af Amer: >60 mL/min (ref >60)
Glucose, Bld: 99 mg/dL (ref 70–99)
POTASSIUM: 4.3 mmol/L (ref 3.5–5.1)
Sodium: 138 mmol/L (ref 135–145)
Total Bilirubin: 0.3 mg/dL (ref 0.3–1.2)
Total Protein: 7.4 g/dL (ref 6.5–8.1)

## 2018-05-31 MED ORDER — HEPARIN SOD (PORK) LOCK FLUSH 100 UNIT/ML IV SOLN
500.0000 [IU] | Freq: Once | INTRAVENOUS | Status: AC | PRN
  Administered 2018-05-31: 500 [IU]
  Filled 2018-05-31: qty 5

## 2018-05-31 MED ORDER — DIPHENHYDRAMINE HCL 25 MG PO CAPS
ORAL_CAPSULE | ORAL | Status: AC
  Filled 2018-05-31: qty 1

## 2018-05-31 MED ORDER — RADIAPLEXRX EX GEL
Freq: Once | CUTANEOUS | Status: AC
  Administered 2018-05-31: 15:00:00 via TOPICAL

## 2018-05-31 MED ORDER — ACETAMINOPHEN 325 MG PO TABS
ORAL_TABLET | ORAL | Status: AC
  Filled 2018-05-31: qty 2

## 2018-05-31 MED ORDER — SODIUM CHLORIDE 0.9% FLUSH
10.0000 mL | INTRAVENOUS | Status: DC | PRN
  Administered 2018-05-31: 10 mL
  Filled 2018-05-31: qty 10

## 2018-05-31 MED ORDER — DIPHENHYDRAMINE HCL 25 MG PO CAPS
25.0000 mg | ORAL_CAPSULE | Freq: Once | ORAL | Status: AC
  Administered 2018-05-31: 25 mg via ORAL

## 2018-05-31 MED ORDER — TRASTUZUMAB CHEMO 150 MG IV SOLR
450.0000 mg | Freq: Once | INTRAVENOUS | Status: AC
  Administered 2018-05-31: 450 mg via INTRAVENOUS
  Filled 2018-05-31: qty 21.43

## 2018-05-31 MED ORDER — ALRA NON-METALLIC DEODORANT (RAD-ONC)
1.0000 "application " | Freq: Once | TOPICAL | Status: AC
  Administered 2018-05-31: 1 via TOPICAL

## 2018-05-31 MED ORDER — SODIUM CHLORIDE 0.9 % IV SOLN
Freq: Once | INTRAVENOUS | Status: AC
  Administered 2018-05-31: 11:00:00 via INTRAVENOUS
  Filled 2018-05-31: qty 250

## 2018-05-31 MED ORDER — ACETAMINOPHEN 325 MG PO TABS
650.0000 mg | ORAL_TABLET | Freq: Once | ORAL | Status: AC
  Administered 2018-05-31: 650 mg via ORAL

## 2018-05-31 NOTE — Progress Notes (Signed)
Pt here for patient teaching.  Pt given Radiation and You booklet, skin care instructions, Alra deodorant and Radiaplex gel.  Reviewed areas of pertinence such as fatigue, hair loss, skin changes, breast tenderness and breast swelling . Pt able to give teach back of to pat skin and use unscented/gentle soap,apply Radiaplex bid, avoid applying anything to skin within 4 hours of treatment, avoid wearing an under wire bra and to use an electric razor if they must shave. Pt verbalizes understanding of information given and will contact nursing with any questions or concerns.     Matraca Hunkins M. Chyler Creely RN, BSN      

## 2018-05-31 NOTE — Patient Instructions (Signed)
[facility] Discharge Instructions for Patients Receiving Chemotherapy  Today you received the following chemotherapy agents Herceptin  To help prevent nausea and vomiting after your treatment, we encourage you to take your nausea medication as directed   If you develop nausea and vomiting that is not controlled by your nausea medication, call the clinic.   BELOW ARE SYMPTOMS THAT SHOULD BE REPORTED IMMEDIATELY:  *FEVER GREATER THAN 100.5 F  *CHILLS WITH OR WITHOUT FEVER  NAUSEA AND VOMITING THAT IS NOT CONTROLLED WITH YOUR NAUSEA MEDICATION  *UNUSUAL SHORTNESS OF BREATH  *UNUSUAL BRUISING OR BLEEDING  TENDERNESS IN MOUTH AND THROAT WITH OR WITHOUT PRESENCE OF ULCERS  *URINARY PROBLEMS  *BOWEL PROBLEMS  UNUSUAL RASH Items with * indicate a potential emergency and should be followed up as soon as possible.  Feel free to call the clinic should you have any questions or concerns. The clinic phone number is [phone].  Please show the CHEMO ALERT CARD at check-in to the Emergency Department and triage nurse.   

## 2018-05-31 NOTE — Progress Notes (Signed)
Patient is scheduled for echocardiogram next week.

## 2018-06-03 ENCOUNTER — Ambulatory Visit
Admission: RE | Admit: 2018-06-03 | Discharge: 2018-06-03 | Disposition: A | Discharge status: Home / Self Care | Payer: Medicare Other | Source: Ambulatory Visit | Attending: Radiation Oncology | Admitting: Radiation Oncology

## 2018-06-03 DIAGNOSIS — C50312 Malignant neoplasm of lower-inner quadrant of left female breast: Secondary | ICD-10-CM | POA: Diagnosis not present

## 2018-06-03 DIAGNOSIS — Z170 Estrogen receptor positive status [ER+]: Secondary | ICD-10-CM | POA: Diagnosis not present

## 2018-06-03 DIAGNOSIS — Z510 Encounter for antineoplastic radiation therapy: Secondary | ICD-10-CM | POA: Diagnosis not present

## 2018-06-04 ENCOUNTER — Ambulatory Visit
Admission: RE | Admit: 2018-06-04 | Discharge: 2018-06-04 | Disposition: A | Discharge status: Home / Self Care | Payer: Medicare Other | Source: Ambulatory Visit | Attending: Radiation Oncology | Admitting: Radiation Oncology

## 2018-06-04 DIAGNOSIS — Z510 Encounter for antineoplastic radiation therapy: Secondary | ICD-10-CM | POA: Diagnosis not present

## 2018-06-04 DIAGNOSIS — Z170 Estrogen receptor positive status [ER+]: Secondary | ICD-10-CM | POA: Diagnosis not present

## 2018-06-04 DIAGNOSIS — C50312 Malignant neoplasm of lower-inner quadrant of left female breast: Secondary | ICD-10-CM | POA: Diagnosis not present

## 2018-06-05 ENCOUNTER — Ambulatory Visit
Admission: RE | Admit: 2018-06-05 | Discharge: 2018-06-05 | Disposition: A | Discharge status: Home / Self Care | Payer: Medicare Other | Source: Ambulatory Visit | Attending: Radiation Oncology | Admitting: Radiation Oncology

## 2018-06-05 ENCOUNTER — Other Ambulatory Visit: Payer: Self-pay

## 2018-06-05 DIAGNOSIS — C50312 Malignant neoplasm of lower-inner quadrant of left female breast: Secondary | ICD-10-CM | POA: Diagnosis not present

## 2018-06-05 DIAGNOSIS — Z510 Encounter for antineoplastic radiation therapy: Secondary | ICD-10-CM | POA: Diagnosis not present

## 2018-06-05 DIAGNOSIS — Z170 Estrogen receptor positive status [ER+]: Secondary | ICD-10-CM | POA: Diagnosis not present

## 2018-06-06 ENCOUNTER — Ambulatory Visit (HOSPITAL_BASED_OUTPATIENT_CLINIC_OR_DEPARTMENT_OTHER)
Admission: RE | Admit: 2018-06-06 | Discharge: 2018-06-06 | Disposition: A | Discharge status: Home / Self Care | Payer: Medicare Other | Source: Ambulatory Visit | Attending: Internal Medicine | Admitting: Internal Medicine

## 2018-06-06 ENCOUNTER — Other Ambulatory Visit: Payer: Self-pay

## 2018-06-06 ENCOUNTER — Ambulatory Visit (HOSPITAL_COMMUNITY)
Admission: RE | Admit: 2018-06-06 | Discharge: 2018-06-06 | Disposition: A | Discharge status: Home / Self Care | Payer: Medicare Other | Source: Ambulatory Visit | Attending: Internal Medicine | Admitting: Internal Medicine

## 2018-06-06 ENCOUNTER — Ambulatory Visit
Admission: RE | Admit: 2018-06-06 | Discharge: 2018-06-06 | Disposition: A | Discharge status: Home / Self Care | Payer: Medicare Other | Source: Ambulatory Visit | Attending: Radiation Oncology | Admitting: Radiation Oncology

## 2018-06-06 ENCOUNTER — Encounter (HOSPITAL_COMMUNITY): Payer: Self-pay | Admitting: Internal Medicine

## 2018-06-06 VITALS — BP 118/68 | HR 84 | Wt 167.4 lb

## 2018-06-06 DIAGNOSIS — Z170 Estrogen receptor positive status [ER+]: Secondary | ICD-10-CM | POA: Diagnosis not present

## 2018-06-06 DIAGNOSIS — Z888 Allergy status to other drugs, medicaments and biological substances status: Secondary | ICD-10-CM | POA: Diagnosis not present

## 2018-06-06 DIAGNOSIS — Z79899 Other long term (current) drug therapy: Secondary | ICD-10-CM | POA: Insufficient documentation

## 2018-06-06 DIAGNOSIS — Z79890 Hormone replacement therapy (postmenopausal): Secondary | ICD-10-CM | POA: Insufficient documentation

## 2018-06-06 DIAGNOSIS — Z887 Allergy status to serum and vaccine status: Secondary | ICD-10-CM | POA: Diagnosis not present

## 2018-06-06 DIAGNOSIS — Z801 Family history of malignant neoplasm of trachea, bronchus and lung: Secondary | ICD-10-CM | POA: Insufficient documentation

## 2018-06-06 DIAGNOSIS — F17200 Nicotine dependence, unspecified, uncomplicated: Secondary | ICD-10-CM

## 2018-06-06 DIAGNOSIS — Z8049 Family history of malignant neoplasm of other genital organs: Secondary | ICD-10-CM | POA: Diagnosis not present

## 2018-06-06 DIAGNOSIS — Z880 Allergy status to penicillin: Secondary | ICD-10-CM | POA: Insufficient documentation

## 2018-06-06 DIAGNOSIS — Z881 Allergy status to other antibiotic agents status: Secondary | ICD-10-CM | POA: Diagnosis not present

## 2018-06-06 DIAGNOSIS — J449 Chronic obstructive pulmonary disease, unspecified: Secondary | ICD-10-CM | POA: Insufficient documentation

## 2018-06-06 DIAGNOSIS — Z808 Family history of malignant neoplasm of other organs or systems: Secondary | ICD-10-CM | POA: Insufficient documentation

## 2018-06-06 DIAGNOSIS — Z510 Encounter for antineoplastic radiation therapy: Secondary | ICD-10-CM | POA: Diagnosis not present

## 2018-06-06 DIAGNOSIS — K219 Gastro-esophageal reflux disease without esophagitis: Secondary | ICD-10-CM | POA: Diagnosis not present

## 2018-06-06 DIAGNOSIS — F17210 Nicotine dependence, cigarettes, uncomplicated: Secondary | ICD-10-CM | POA: Diagnosis not present

## 2018-06-06 DIAGNOSIS — C50312 Malignant neoplasm of lower-inner quadrant of left female breast: Secondary | ICD-10-CM | POA: Diagnosis not present

## 2018-06-06 DIAGNOSIS — I10 Essential (primary) hypertension: Secondary | ICD-10-CM | POA: Diagnosis not present

## 2018-06-06 NOTE — Progress Notes (Signed)
Cardio-Oncology Clinic Consult Note   Referring Physician: Dr. Jana Hakim Primary Care: [name], MD Primary Cardiologist: New  HPI:  Margaret Hodges is a 73 y.o. female with past medical history of COPD with ongoing tobacco use, HTN, Guillane Barre syndrome and left breast cancer who has been referred by Dr. Jana Hakim to establish in the cardio-oncology clinic for monitoring of cardio-toxicity while undergoing chemotherapy.  Oncologic history 73 y.o. [place] woman status post left breast lower inner quadrant biopsy 01/16/2018 for a clinical T1c N0, stage IA invasive ductal carcinoma, grade 3, estrogen receptor weakly positive, progesterone receptor negative, with an MIB-1 of 80%, and HER-2 amplified  (1) Genetics 03/06/2018 showed no deleterious mutations  (2) Status post left lumpectomy and sentinel lymph node sampling 02/18/2018 for a pT2 pN0, stage IIA invasive ductal carcinoma, grade 3, with negative margins.             (a) a total of 5 lymph nodes were removed   (3) Adjuvant chemo immunotherapy consisting of paclitaxel/trastuzumab weekly for 12 weeks, started 03/08/2018.             (a) paclitaxel discontinued after 6 doses because of peripheral neuropathy; last dose 04/12/2018  (4) Trastuzumab to be continued to complete a year  (5) Adjuvant radiation started 05/31/18  (6) Antiestrogens to follow at the completion of local treatment.             (a) bone density at [facility] 12/27/2017 shows a T score of -1.7   She presents today to establish in the cardio-oncology clinic. She is feeling well overall, considering. She has tolerated radiation and chemo for the most part. Has had nausea from herceptin, neuropathy on paclitaxel, which has been stopped. Denies any SOB doing extensive house and yard work. Denies CP. Prior to her diagnosis, she was doing aerobics and was very active. Less so now, but continues to remain active. No orthopnea or PND. Mild  lightheadedness at times, but not marked or limiting. Lives at home with husband, who is terminally ill with multiple medical conditions. Denies claudication.   Echo today shows 60-65%, Personally reviewed   Echo 02/06/2018 LVEF 60-65%, GLS -22.1%, No AI, No MR.   Review of Systems: [y] = yes, [ ] = no    General: Weight gain [ ]; Weight loss [ ]; Anorexia [ ]; Fatigue Blue.Reese ]; Fever [ ]; Chills [ ]; Weakness [ ]   Cardiac: Chest pain/pressure [ ]; Resting SOB [ ]; Exertional SOB [ ]; Orthopnea [ ]; Pedal Edema [ ]; Palpitations [ ]; Syncope [ ]; Presyncope [ ]; Paroxysmal nocturnal dyspnea[ ]   Pulmonary: Cough [ ]; Wheezing[ ]; Hemoptysis[ ]; Sputum [ ]; Snoring [ ]   GI: Vomiting[ ]; Dysphagia[ ]; Melena[ ]; Hematochezia [ ]; Heartburn[ ]; Abdominal pain [ ]; Constipation [ ]; Diarrhea [ ]; BRBPR [ ]   GU: Hematuria[ ]; Dysuria [ ]; Nocturia[ ]   Vascular: Pain in legs with walking [ ]; Pain in feet with lying flat [ ]; Non-healing sores [ ]; Stroke [ ]; TIA [ ]; Slurred speech [ ];   Neuro: Headaches[ ]; Vertigo[ ]; Seizures[ ]; Paresthesias[ ];Blurred vision [ ]; Diplopia [ ]; Vision changes [ ]   Ortho/Skin: Arthritis [y]; Joint pain [y]; Muscle pain [ ]; Joint swelling [ ]; Back Pain [ ]; Rash [ ]   Psych: Depression[ ]; Anxiety[ ]   Heme: Bleeding problems [ ]; Clotting disorders [ ]; Anemia [ ]  Endocrine: Diabetes [ ]; Thyroid dysfunction[ ]  Past Medical History:  Diagnosis Date   Anginal pain ([place])    with admission to hospital due to high blood pressure   Complication of anesthesia    Family history of ovarian cancer    GERD (gastroesophageal reflux disease)    Guillain Barr syndrome ([place]) 1959   Guillain Barr syndrome ([place]) 1959   Guillain Barr syndrome ([place]) 1959   Headache    sinus headaches   History of hiatal hernia    was told pt. had one, but no testing   Hypertensive urgency 04/05/2014   Malignant neoplasm of lower-inner quadrant of left  breast in female, estrogen receptor positive ([place]) 01/24/2018   Neuromuscular disorder ([place])    Guillian- Barre Syndrome   Pneumonia    hx. of walking pneumonia   PONV (postoperative nausea and vomiting)    Current Outpatient Medications  Medication Sig Dispense Refill   amLODipine (NORVASC) 5 MG tablet Take 1 tablet (5 mg total) by mouth daily. 30 tablet 0   Cholecalciferol (VITAMIN D) 2000 UNITS tablet Take 2,000 Units by mouth daily.     diclofenac sodium (VOLTAREN) 1 % GEL Apply 4 g topically 4 (four) times daily as needed (arthritis pain).      HYDROcodone-acetaminophen (NORCO) 5-325 MG tablet Take 1-2 tablets by mouth every 6 (six) hours as needed for moderate pain or severe pain. 30 tablet 0   hyoscyamine (LEVBID) 0.375 MG 12 hr tablet Take 0.375 mg by mouth every 12 (twelve) hours as needed (blaader spasms).     levothyroxine (SYNTHROID, LEVOTHROID) 100 MCG tablet Take 100 mcg by mouth daily.     omeprazole (PRILOSEC) 40 MG capsule Take 1 capsule (40 mg total) by mouth daily. 60 capsule 4   saccharomyces boulardii (FLORASTOR) 250 MG capsule Take 250 mg by mouth daily as needed (only when on antibiotic).     No current facility-administered medications for this encounter.    Allergies  Allergen Reactions   Allegra [Fexofenadine] Hives   Clindamycin/Lincomycin Hives and Diarrhea   Influenza A (H1n1) Monoval Vac Other (See Comments)    Guillian Barre Syndrome   Keflex [Cephalexin] Hives   Penicillins Swelling    Has patient had a PCN reaction causing immediate rash, facial/tongue/throat swelling, SOB or lightheadedness with hypotension: No Has patient had a PCN reaction causing severe rash involving mucus membranes or skin necrosis: No Has patient had a PCN reaction that required hospitalization No Has patient had a PCN reaction occurring within the last 10 years: No If all of the above answers are "NO", then may proceed with Cephalosporin use.    Prednisone  Other (See Comments)    "heart attack symptoms" but even had heart cath & had no cardiac disease   Protonix [Pantoprazole Sodium] Diarrhea    Tolerates Prilosec   Statins Other (See Comments)    myalgias   Social History   Socioeconomic History   Marital status: Married    Spouse name: Not on file   Number of children: Not on file   Years of education: Not on file   Highest education level: Not on file  Occupational History   Not on file  Social Needs   Financial resource strain: Not on file   Food insecurity:    Worry: Not on file    Inability: Not on file   Transportation needs:    Medical: Not on file    Non-medical: Not on file  Tobacco Use  Smoking status: Current Every Day Smoker    Packs/day: 1.50    Types: Cigarettes   Smokeless tobacco: Never Used  Substance and Sexual Activity   Alcohol use: No   Drug use: No   Sexual activity: Not Currently  Lifestyle   Physical activity:    Days per week: Not on file    Minutes per session: Not on file   Stress: Not on file  Relationships   Social connections:    Talks on phone: Not on file    Gets together: Not on file    Attends religious service: Not on file    Active member of club or organization: Not on file    Attends meetings of clubs or organizations: Not on file    Relationship status: Not on file   Intimate partner violence:    Fear of current or ex partner: Not on file    Emotionally abused: Not on file    Physically abused: Not on file    Forced sexual activity: Not on file  Other Topics Concern   Not on file  Social History Narrative   Not on file    Family History  Problem Relation Age of Onset   Ovarian cancer Sister    Lung cancer Sister    Throat cancer Brother    Brain cancer Sister    Vitals:   06/06/18 1457  BP: 118/68  Pulse: 84  SpO2: 96%  Weight: 75.9 kg (167 lb 6.4 oz)   PHYSICAL EXAM: General:  Well appearing. No respiratory difficulty HEENT:  normal Neck: supple. no JVD. Carotids 2+ bilat; no bruits. No lymphadenopathy or thyromegaly appreciated. Cor: PMI nondisplaced. Regular rate & rhythm. No rubs, gallops or murmurs. + pot Lungs: clear with markedly decreased BS throughout  Abdomen: soft, nontender, nondistended. No hepatosplenomegaly. No bruits or masses. Good bowel sounds. Extremities: no cyanosis, clubbing, rash, edema Neuro: alert & oriented x 3, cranial nerves grossly intact. moves all 4 extremities w/o difficulty. Affect pleasant.  ASSESSMENT & PLAN:  1. Left breast cancer - Invasive ductal carcinoma, grade III. Prognostic indicators significant for: estrogen receptor, 60% positive, with weak staining intensity; progesterone receptor negative, Proliferation marker Ki67 at 80%. HER2 positive by immunohistochemistry, 3+. - Echo today shows normal EF and strain pattern. Continue herceptin - OK to continue Herceptin.  - RTC 3 months with repeat Echo.  2. Tobacco abuse - Smokes 1 ppd. Counseled on smoking cessation/reduction.   Shirley Friar, PA-C 06/06/18   Patient seen and examined with the above-signed Advanced Practice Provider and/or Housestaff. I personally reviewed laboratory data, imaging studies and relevant notes. I independently examined the patient and formulated the important aspects of the plan. I have edited the note to reflect any of my changes or salient points. I have personally discussed the plan with the patient and/or family.  Explained incidence of Herceptin cardiotoxicity and role of Cardio-oncology clinic at length. Echo images reviewed personally. All parameters stable. Reviewed signs and symptoms of HF to look for. Continue Herceptin. Follow-up with echo in 3 months. Discussed efforts to try and cut back on smoking.   Glori Bickers, MD  11:45 PM

## 2018-06-06 NOTE — Progress Notes (Signed)
  Echocardiogram 2D Echocardiogram has been performed.  Randa Lynn Dantrell Schertzer 06/06/2018, 2:36 PM

## 2018-06-06 NOTE — Patient Instructions (Signed)
Your physician has requested that you have an echocardiogram. Echocardiography is a painless test that uses sound waves to create images of your heart. It provides your doctor with information about the size and shape of your heart and how well your heart's chambers and valves are working. This procedure takes approximately one hour. There are no restrictions for this procedure.  Your physician recommends that you schedule a follow-up appointment in: 3 MONTHS

## 2018-06-07 ENCOUNTER — Other Ambulatory Visit: Payer: Medicare Other

## 2018-06-07 ENCOUNTER — Ambulatory Visit: Payer: Medicare Other

## 2018-06-07 ENCOUNTER — Ambulatory Visit
Admission: RE | Admit: 2018-06-07 | Discharge: 2018-06-07 | Disposition: A | Discharge status: Home / Self Care | Payer: Medicare Other | Source: Ambulatory Visit | Attending: Radiation Oncology | Admitting: Radiation Oncology

## 2018-06-07 ENCOUNTER — Other Ambulatory Visit: Payer: Self-pay

## 2018-06-07 DIAGNOSIS — Z510 Encounter for antineoplastic radiation therapy: Secondary | ICD-10-CM | POA: Diagnosis not present

## 2018-06-07 DIAGNOSIS — Z170 Estrogen receptor positive status [ER+]: Secondary | ICD-10-CM | POA: Diagnosis not present

## 2018-06-07 DIAGNOSIS — C50312 Malignant neoplasm of lower-inner quadrant of left female breast: Secondary | ICD-10-CM | POA: Diagnosis not present

## 2018-06-10 ENCOUNTER — Other Ambulatory Visit: Payer: Self-pay

## 2018-06-10 ENCOUNTER — Ambulatory Visit
Admission: RE | Admit: 2018-06-10 | Discharge: 2018-06-10 | Disposition: A | Discharge status: Home / Self Care | Payer: Medicare Other | Source: Ambulatory Visit | Attending: Radiation Oncology | Admitting: Radiation Oncology

## 2018-06-10 DIAGNOSIS — Z170 Estrogen receptor positive status [ER+]: Secondary | ICD-10-CM | POA: Diagnosis not present

## 2018-06-10 DIAGNOSIS — Z510 Encounter for antineoplastic radiation therapy: Secondary | ICD-10-CM | POA: Diagnosis not present

## 2018-06-10 DIAGNOSIS — C50312 Malignant neoplasm of lower-inner quadrant of left female breast: Secondary | ICD-10-CM | POA: Diagnosis not present

## 2018-06-11 ENCOUNTER — Other Ambulatory Visit: Payer: Self-pay

## 2018-06-11 ENCOUNTER — Ambulatory Visit
Admission: RE | Admit: 2018-06-11 | Discharge: 2018-06-11 | Disposition: A | Discharge status: Home / Self Care | Payer: Medicare Other | Source: Ambulatory Visit | Attending: Radiation Oncology | Admitting: Radiation Oncology

## 2018-06-11 DIAGNOSIS — Z510 Encounter for antineoplastic radiation therapy: Secondary | ICD-10-CM | POA: Diagnosis not present

## 2018-06-11 DIAGNOSIS — Z170 Estrogen receptor positive status [ER+]: Secondary | ICD-10-CM | POA: Diagnosis not present

## 2018-06-11 DIAGNOSIS — C50312 Malignant neoplasm of lower-inner quadrant of left female breast: Secondary | ICD-10-CM | POA: Diagnosis not present

## 2018-06-12 ENCOUNTER — Ambulatory Visit
Admission: RE | Admit: 2018-06-12 | Discharge: 2018-06-12 | Disposition: A | Discharge status: Home / Self Care | Payer: Medicare Other | Source: Ambulatory Visit | Attending: Radiation Oncology | Admitting: Radiation Oncology

## 2018-06-12 ENCOUNTER — Other Ambulatory Visit: Payer: Self-pay

## 2018-06-12 DIAGNOSIS — C50312 Malignant neoplasm of lower-inner quadrant of left female breast: Secondary | ICD-10-CM | POA: Diagnosis not present

## 2018-06-12 DIAGNOSIS — Z170 Estrogen receptor positive status [ER+]: Secondary | ICD-10-CM | POA: Diagnosis not present

## 2018-06-12 DIAGNOSIS — Z510 Encounter for antineoplastic radiation therapy: Secondary | ICD-10-CM | POA: Diagnosis not present

## 2018-06-13 ENCOUNTER — Other Ambulatory Visit: Payer: Self-pay

## 2018-06-13 ENCOUNTER — Ambulatory Visit
Admission: RE | Admit: 2018-06-13 | Discharge: 2018-06-13 | Disposition: A | Discharge status: Home / Self Care | Payer: Medicare Other | Source: Ambulatory Visit | Attending: Radiation Oncology | Admitting: Radiation Oncology

## 2018-06-13 DIAGNOSIS — Z170 Estrogen receptor positive status [ER+]: Secondary | ICD-10-CM | POA: Diagnosis not present

## 2018-06-13 DIAGNOSIS — Z510 Encounter for antineoplastic radiation therapy: Secondary | ICD-10-CM | POA: Diagnosis not present

## 2018-06-13 DIAGNOSIS — C50312 Malignant neoplasm of lower-inner quadrant of left female breast: Secondary | ICD-10-CM | POA: Diagnosis not present

## 2018-06-14 ENCOUNTER — Other Ambulatory Visit: Payer: Medicare Other

## 2018-06-14 ENCOUNTER — Other Ambulatory Visit: Payer: Self-pay

## 2018-06-14 ENCOUNTER — Ambulatory Visit: Payer: Medicare Other

## 2018-06-14 ENCOUNTER — Ambulatory Visit
Admission: RE | Admit: 2018-06-14 | Discharge: 2018-06-14 | Disposition: A | Discharge status: Home / Self Care | Payer: Medicare Other | Source: Ambulatory Visit | Attending: Radiation Oncology | Admitting: Radiation Oncology

## 2018-06-14 DIAGNOSIS — Z510 Encounter for antineoplastic radiation therapy: Secondary | ICD-10-CM | POA: Diagnosis not present

## 2018-06-14 DIAGNOSIS — Z170 Estrogen receptor positive status [ER+]: Secondary | ICD-10-CM | POA: Diagnosis not present

## 2018-06-14 DIAGNOSIS — C50312 Malignant neoplasm of lower-inner quadrant of left female breast: Secondary | ICD-10-CM | POA: Diagnosis not present

## 2018-06-17 ENCOUNTER — Other Ambulatory Visit: Payer: Self-pay

## 2018-06-17 ENCOUNTER — Ambulatory Visit
Admission: RE | Admit: 2018-06-17 | Discharge: 2018-06-17 | Disposition: A | Discharge status: Home / Self Care | Payer: Medicare Other | Source: Ambulatory Visit | Attending: Radiation Oncology | Admitting: Radiation Oncology

## 2018-06-17 DIAGNOSIS — C50312 Malignant neoplasm of lower-inner quadrant of left female breast: Secondary | ICD-10-CM | POA: Diagnosis not present

## 2018-06-17 DIAGNOSIS — Z170 Estrogen receptor positive status [ER+]: Secondary | ICD-10-CM | POA: Diagnosis not present

## 2018-06-17 DIAGNOSIS — Z510 Encounter for antineoplastic radiation therapy: Secondary | ICD-10-CM | POA: Diagnosis not present

## 2018-06-18 ENCOUNTER — Other Ambulatory Visit: Payer: Self-pay

## 2018-06-18 ENCOUNTER — Ambulatory Visit
Admission: RE | Admit: 2018-06-18 | Discharge: 2018-06-18 | Disposition: A | Discharge status: Home / Self Care | Payer: Medicare Other | Source: Ambulatory Visit | Attending: Radiation Oncology | Admitting: Radiation Oncology

## 2018-06-18 DIAGNOSIS — C50312 Malignant neoplasm of lower-inner quadrant of left female breast: Secondary | ICD-10-CM | POA: Diagnosis not present

## 2018-06-18 DIAGNOSIS — Z170 Estrogen receptor positive status [ER+]: Secondary | ICD-10-CM | POA: Diagnosis not present

## 2018-06-18 DIAGNOSIS — Z510 Encounter for antineoplastic radiation therapy: Secondary | ICD-10-CM | POA: Diagnosis not present

## 2018-06-19 ENCOUNTER — Other Ambulatory Visit: Payer: Self-pay

## 2018-06-19 ENCOUNTER — Ambulatory Visit
Admission: RE | Admit: 2018-06-19 | Discharge: 2018-06-19 | Disposition: A | Discharge status: Home / Self Care | Payer: Medicare Other | Source: Ambulatory Visit | Attending: Radiation Oncology | Admitting: Radiation Oncology

## 2018-06-19 DIAGNOSIS — C50312 Malignant neoplasm of lower-inner quadrant of left female breast: Secondary | ICD-10-CM | POA: Diagnosis not present

## 2018-06-19 DIAGNOSIS — Z170 Estrogen receptor positive status [ER+]: Secondary | ICD-10-CM | POA: Diagnosis not present

## 2018-06-19 DIAGNOSIS — Z510 Encounter for antineoplastic radiation therapy: Secondary | ICD-10-CM | POA: Diagnosis not present

## 2018-06-20 ENCOUNTER — Other Ambulatory Visit: Payer: Self-pay

## 2018-06-20 ENCOUNTER — Ambulatory Visit
Admission: RE | Admit: 2018-06-20 | Discharge: 2018-06-20 | Disposition: A | Discharge status: Home / Self Care | Payer: Medicare Other | Source: Ambulatory Visit | Attending: Radiation Oncology | Admitting: Radiation Oncology

## 2018-06-20 DIAGNOSIS — C50312 Malignant neoplasm of lower-inner quadrant of left female breast: Secondary | ICD-10-CM | POA: Diagnosis not present

## 2018-06-20 DIAGNOSIS — Z170 Estrogen receptor positive status [ER+]: Secondary | ICD-10-CM | POA: Diagnosis not present

## 2018-06-20 DIAGNOSIS — Z510 Encounter for antineoplastic radiation therapy: Secondary | ICD-10-CM | POA: Diagnosis not present

## 2018-06-21 ENCOUNTER — Inpatient Hospital Stay: Payer: Medicare Other

## 2018-06-21 ENCOUNTER — Other Ambulatory Visit: Payer: Self-pay

## 2018-06-21 ENCOUNTER — Ambulatory Visit
Admission: RE | Admit: 2018-06-21 | Discharge: 2018-06-21 | Disposition: A | Discharge status: Home / Self Care | Payer: Medicare Other | Source: Ambulatory Visit | Attending: Radiation Oncology | Admitting: Radiation Oncology

## 2018-06-21 ENCOUNTER — Ambulatory Visit: Payer: Medicare Other | Admitting: Radiation Oncology

## 2018-06-21 VITALS — BP 146/68 | HR 78 | Temp 98.5°F | Resp 18

## 2018-06-21 DIAGNOSIS — Z170 Estrogen receptor positive status [ER+]: Principal | ICD-10-CM

## 2018-06-21 DIAGNOSIS — C50312 Malignant neoplasm of lower-inner quadrant of left female breast: Secondary | ICD-10-CM | POA: Diagnosis not present

## 2018-06-21 DIAGNOSIS — Z5112 Encounter for antineoplastic immunotherapy: Secondary | ICD-10-CM | POA: Diagnosis not present

## 2018-06-21 DIAGNOSIS — Z95828 Presence of other vascular implants and grafts: Secondary | ICD-10-CM

## 2018-06-21 DIAGNOSIS — Z510 Encounter for antineoplastic radiation therapy: Secondary | ICD-10-CM | POA: Diagnosis not present

## 2018-06-21 DIAGNOSIS — Z720 Tobacco use: Secondary | ICD-10-CM

## 2018-06-21 LAB — COMPREHENSIVE METABOLIC PANEL
ALBUMIN: 3.8 g/dL (ref 3.5–5.0)
ALT: 19 U/L (ref 0–44)
AST: 14 U/L — ABNORMAL LOW (ref 15–41)
Alkaline Phosphatase: 103 U/L (ref 38–126)
Anion gap: 8 (ref 5–15)
BUN: 12 mg/dL (ref 8–23)
CHLORIDE: 107 mmol/L (ref 98–111)
CO2: 25 mmol/L (ref 22–32)
Calcium: 9.6 mg/dL (ref 8.9–10.3)
Creatinine, Ser: 0.79 mg/dL (ref 0.44–1.00)
GFR calc Af Amer: >60 mL/min (ref >60)
GFR calc non Af Amer: >60 mL/min (ref >60)
Glucose, Bld: 103 mg/dL — ABNORMAL HIGH (ref 70–99)
Potassium: 4.2 mmol/L (ref 3.5–5.1)
SODIUM: 140 mmol/L (ref 135–145)
Total Bilirubin: 0.4 mg/dL (ref 0.3–1.2)
Total Protein: 7.3 g/dL (ref 6.5–8.1)

## 2018-06-21 LAB — CBC WITH DIFFERENTIAL/PLATELET
ABS IMMATURE GRANULOCYTES: 0.02 10*3/uL (ref 0.00–0.07)
Basophils Absolute: 0 10*3/uL (ref 0.0–0.1)
Basophils Relative: 1 %
Eosinophils Absolute: 0.3 10*3/uL (ref 0.0–0.5)
Eosinophils Relative: 3 %
HCT: 42 % (ref 36.0–46.0)
Hemoglobin: 13.7 g/dL (ref 12.0–15.0)
IMMATURE GRANULOCYTES: 0 %
Lymphocytes Relative: 17 %
Lymphs Abs: 1.4 10*3/uL (ref 0.7–4.0)
MCH: 33.2 pg (ref 26.0–34.0)
MCHC: 32.6 g/dL (ref 30.0–36.0)
MCV: 101.7 fL — ABNORMAL HIGH (ref 80.0–100.0)
Monocytes Absolute: 0.8 10*3/uL (ref 0.1–1.0)
Monocytes Relative: 10 %
NEUTROS PCT: 69 %
Neutro Abs: 5.5 10*3/uL (ref 1.7–7.7)
Platelets: 282 10*3/uL (ref 150–400)
RBC: 4.13 MIL/uL (ref 3.87–5.11)
RDW: 13 % (ref 11.5–15.5)
WBC: 8 10*3/uL (ref 4.0–10.5)
nRBC: 0 % (ref 0.0–0.2)

## 2018-06-21 MED ORDER — DIPHENHYDRAMINE HCL 25 MG PO CAPS
ORAL_CAPSULE | ORAL | Status: AC
  Filled 2018-06-21: qty 1

## 2018-06-21 MED ORDER — ACETAMINOPHEN 325 MG PO TABS
650.0000 mg | ORAL_TABLET | Freq: Once | ORAL | Status: AC
  Administered 2018-06-21: 650 mg via ORAL

## 2018-06-21 MED ORDER — SODIUM CHLORIDE 0.9% FLUSH
10.0000 mL | INTRAVENOUS | Status: DC | PRN
  Administered 2018-06-21: 10 mL
  Filled 2018-06-21: qty 10

## 2018-06-21 MED ORDER — ACETAMINOPHEN 325 MG PO TABS
ORAL_TABLET | ORAL | Status: AC
  Filled 2018-06-21: qty 2

## 2018-06-21 MED ORDER — HEPARIN SOD (PORK) LOCK FLUSH 100 UNIT/ML IV SOLN
500.0000 [IU] | Freq: Once | INTRAVENOUS | Status: AC | PRN
  Administered 2018-06-21: 500 [IU]
  Filled 2018-06-21: qty 5

## 2018-06-21 MED ORDER — SODIUM CHLORIDE 0.9 % IV SOLN
Freq: Once | INTRAVENOUS | Status: AC
  Administered 2018-06-21: 08:00:00 via INTRAVENOUS
  Filled 2018-06-21: qty 250

## 2018-06-21 MED ORDER — TRASTUZUMAB CHEMO 150 MG IV SOLR
450.0000 mg | Freq: Once | INTRAVENOUS | Status: AC
  Administered 2018-06-21: 450 mg via INTRAVENOUS
  Filled 2018-06-21: qty 21.43

## 2018-06-21 MED ORDER — DIPHENHYDRAMINE HCL 25 MG PO CAPS
25.0000 mg | ORAL_CAPSULE | Freq: Once | ORAL | Status: AC
  Administered 2018-06-21: 25 mg via ORAL

## 2018-06-21 NOTE — Patient Instructions (Signed)
Coronavirus (COVID-19) Are you at risk?  Are you at risk for the Coronavirus (COVID-19)?  To be considered HIGH RISK for Coronavirus (COVID-19), you have to meet the following criteria:  . Traveled to [place], [place], [place], [place] or [place]; or in the [place] to [place], [place], [place], or [place]; and have fever, cough, and shortness of breath within the last 2 weeks of travel OR . Been in close contact with a person diagnosed with COVID-19 within the last 2 weeks and have fever, cough, and shortness of breath . IF YOU DO NOT MEET THESE CRITERIA, YOU ARE CONSIDERED LOW RISK FOR COVID-19.  What to do if you are HIGH RISK for COVID-19?  . If you are having a medical emergency, call 911. . Seek medical care right away. Before you go to a doctor's office, urgent care or emergency department, call ahead and tell them about your recent travel, contact with someone diagnosed with COVID-19, and your symptoms. You should receive instructions from your physician's office regarding next steps of care.  . When you arrive at healthcare provider, tell the healthcare staff immediately you have returned from visiting [place], [place], [place], [place] or [place]; or traveled in the [place] to [place], [place], [place], or [place]; in the last two weeks or you have been in close contact with a person diagnosed with COVID-19 in the last 2 weeks.   . Tell the health care staff about your symptoms: fever, cough and shortness of breath. . After you have been seen by a medical provider, you will be either: o Tested for (COVID-19) and discharged home on quarantine except to seek medical care if symptoms worsen, and asked to  - Stay home and avoid contact with others until you get your results (4-5 days)  - Avoid travel on public transportation if possible (such as bus, train, or airplane) or o Sent to the Emergency Department by EMS for evaluation, COVID-19 testing, and possible  admission depending on your condition and test results.  What to do if you are LOW RISK for COVID-19?  Reduce your risk of any infection by using the same precautions used for avoiding the common cold or flu:  . Wash your hands often with soap and warm water for at least 20 seconds.  If soap and water are not readily available, use an alcohol-based hand sanitizer with at least 60% alcohol.  . If coughing or sneezing, cover your mouth and nose by coughing or sneezing into the elbow areas of your shirt or coat, into a tissue or into your sleeve (not your hands). . Avoid shaking hands with others and consider head nods or verbal greetings only. . Avoid touching your eyes, nose, or mouth with unwashed hands.  . Avoid close contact with people who are sick. . Avoid places or events with large numbers of people in one location, like concerts or sporting events. . Carefully consider travel plans you have or are making. . If you are planning any travel outside or inside the US, visit the CDC's Travelers' Health webpage for the latest health notices. . If you have some symptoms but not all symptoms, continue to monitor at home and seek medical attention if your symptoms worsen. . If you are having a medical emergency, call 911.   ADDITIONAL HEALTHCARE OPTIONS FOR PATIENTS  [facility] / e-Visit: [email or web address]         [facility]: [phone]     Urgent Care: [phone]                   [facility]: [phone]    [facility] Discharge Instructions for Patients Receiving Chemotherapy  Today you received the following chemotherapy agents Herceptin   To help prevent nausea and vomiting after your treatment, we encourage you to take your nausea medication as directed.    If you develop nausea and vomiting that is not controlled by your nausea medication, call the clinic.   BELOW  ARE SYMPTOMS THAT SHOULD BE REPORTED IMMEDIATELY:  *FEVER GREATER THAN 100.5 F  *CHILLS WITH OR WITHOUT FEVER  NAUSEA AND VOMITING THAT IS NOT CONTROLLED WITH YOUR NAUSEA MEDICATION  *UNUSUAL SHORTNESS OF BREATH  *UNUSUAL BRUISING OR BLEEDING  TENDERNESS IN MOUTH AND THROAT WITH OR WITHOUT PRESENCE OF ULCERS  *URINARY PROBLEMS  *BOWEL PROBLEMS  UNUSUAL RASH Items with * indicate a potential emergency and should be followed up as soon as possible.  Feel free to call the clinic should you have any questions or concerns. The clinic phone number is [phone].  Please show the CHEMO ALERT CARD at check-in to the Emergency Department and triage nurse.   

## 2018-06-24 ENCOUNTER — Ambulatory Visit
Admission: RE | Admit: 2018-06-24 | Discharge: 2018-06-24 | Disposition: A | Discharge status: Home / Self Care | Payer: Medicare Other | Source: Ambulatory Visit | Attending: Radiation Oncology | Admitting: Radiation Oncology

## 2018-06-24 ENCOUNTER — Other Ambulatory Visit: Payer: Self-pay

## 2018-06-24 DIAGNOSIS — Z510 Encounter for antineoplastic radiation therapy: Secondary | ICD-10-CM | POA: Diagnosis not present

## 2018-06-24 DIAGNOSIS — C50312 Malignant neoplasm of lower-inner quadrant of left female breast: Secondary | ICD-10-CM | POA: Diagnosis not present

## 2018-06-24 DIAGNOSIS — Z170 Estrogen receptor positive status [ER+]: Secondary | ICD-10-CM | POA: Diagnosis not present

## 2018-06-25 ENCOUNTER — Ambulatory Visit
Admission: RE | Admit: 2018-06-25 | Discharge: 2018-06-25 | Disposition: A | Discharge status: Home / Self Care | Payer: Medicare Other | Source: Ambulatory Visit | Attending: Radiation Oncology | Admitting: Radiation Oncology

## 2018-06-25 ENCOUNTER — Other Ambulatory Visit: Payer: Self-pay

## 2018-06-25 DIAGNOSIS — Z170 Estrogen receptor positive status [ER+]: Secondary | ICD-10-CM | POA: Diagnosis not present

## 2018-06-25 DIAGNOSIS — Z510 Encounter for antineoplastic radiation therapy: Secondary | ICD-10-CM | POA: Diagnosis not present

## 2018-06-25 DIAGNOSIS — C50312 Malignant neoplasm of lower-inner quadrant of left female breast: Secondary | ICD-10-CM | POA: Diagnosis not present

## 2018-06-26 ENCOUNTER — Encounter: Payer: Self-pay | Admitting: Radiation Oncology

## 2018-06-26 ENCOUNTER — Ambulatory Visit
Admission: RE | Admit: 2018-06-26 | Discharge: 2018-06-26 | Disposition: A | Discharge status: Home / Self Care | Payer: Medicare Other | Source: Ambulatory Visit | Attending: Radiation Oncology | Admitting: Radiation Oncology

## 2018-06-26 ENCOUNTER — Other Ambulatory Visit: Payer: Self-pay

## 2018-06-26 DIAGNOSIS — Z510 Encounter for antineoplastic radiation therapy: Secondary | ICD-10-CM | POA: Insufficient documentation

## 2018-06-26 DIAGNOSIS — Z170 Estrogen receptor positive status [ER+]: Secondary | ICD-10-CM | POA: Insufficient documentation

## 2018-06-26 DIAGNOSIS — C50312 Malignant neoplasm of lower-inner quadrant of left female breast: Secondary | ICD-10-CM | POA: Diagnosis not present

## 2018-07-11 ENCOUNTER — Encounter: Payer: Self-pay | Admitting: *Deleted

## 2018-07-11 NOTE — Progress Notes (Signed)
Margaret Hodges  [phone] [phone]     ID: Margaret Hodges DOB: [birth year]  MR#: [id number]  GTX#:[id number]  Patient Care Team: [name], MD as PCP - General (Family Medicine) Fanny Skates, MD as Consulting Physician (General Surgery) Maryln Eastham, Virgie Dad, MD as Consulting Physician (Oncology) Kyung Rudd, MD as Consulting Physician (Radiation Oncology) Allyn Kenner, MD (Dermatology) Larey Dresser, MD as Consulting Physician (Cardiology) Bensimhon, Shaune Pascal, MD as Consulting Physician (Cardiology) OTHER MD:   CHIEF COMPLAINT: Estrogen receptor positive breast cancer  CURRENT TREATMENT: trastuzumab   HISTORY OF CURRENT ILLNESS: From the original intake note:  Margaret Hodges had routine screening mammography on 01/03/2018 showing a possible abnormality in the left breast. She underwent bilateral diagnostic mammography with tomography and left breast ultrasonography at [facility] on 01/15/2018 showing: Breast density category B; a 1.3 cm round solid mass with an indistinct margin in the left breast at 7 o'clock posterior 8 cm from the nipple. No significant abnormalities found in the left axilla.  Accordingly on 01/16/2018 she proceeded to biopsy of the left breast area in question. The pathology from this procedure showed [phone]): Invasive ductal carcinoma, grade III. Prognostic indicators significant for: estrogen receptor, 60% positive, with weak staining intensity; progesterone receptor negative, Proliferation marker Ki67 at 80%. HER2 positive by immunohistochemistry, 3+.  The patient's subsequent history is as detailed below.   INTERVAL HISTORY: Margaret Hodges returns today for follow-up and treatment of her estrogen receptor positive breast cancer.    She continues on trastuzumab.   Margaret Hodges's last echocardiogram on 06/06/2018, showed an ejection fraction in the 60% - 65% range. Her next echocardiogram is scheduled for 09/09/2018.   Since  her last visit here, she completed her radiation treatments, last dose 06/26/2018. She notes that she burned a little from treatment, with some tenderness. She had fatigue following her final treatment as well; she feels more tired in the afternoons.  She is now ready to start antiestrogens.  She had a bone density screening at [facility] 12/27/2017 showing osteopenia with a T score of -1.7.   REVIEW OF SYSTEMS: Margaret Hodges does some shopping, but her daughter does most of the grocery shopping. She does not have a formal exercise routine, but she is working around her house and outside. She does have some mild burning with urination, which has been treated with an antibiotic. The patient denies unusual headaches, visual changes, nausea, vomiting, or dizziness. There has been no unusual cough, phlegm production, or pleurisy. This been no change in bowel habits. The patient denies unexplained fatigue or unexplained weight loss, bleeding, rash, or fever. A detailed review of systems was otherwise noncontributory.    PAST MEDICAL HISTORY: Past Medical History:  Diagnosis Date   Anginal pain ([place])    with admission to hospital due to high blood pressure   Complication of anesthesia    Family history of ovarian cancer    GERD (gastroesophageal reflux disease)    Guillain Barr syndrome ([place]) 1959   Guillain Barr syndrome ([place]) 1959   Guillain Barr syndrome ([place]) 1959   Headache    sinus headaches   History of hiatal hernia    was told pt. had one, but no testing   Hypertensive urgency 04/05/2014   Malignant neoplasm of lower-inner quadrant of left breast in female, estrogen receptor positive ([place]) 01/24/2018   Neuromuscular disorder ([place])    Guillian- Barre Syndrome   Pneumonia    hx. of walking pneumonia   PONV (postoperative nausea  and vomiting)     PAST SURGICAL HISTORY: Past Surgical History:  Procedure Laterality Date   ABDOMINAL HYSTERECTOMY     Total    Bladder lift  15 years ago,  Uterus prolapse causing intestinal damage and surgery     BREAST LUMPECTOMY WITH RADIOACTIVE SEED AND SENTINEL LYMPH NODE BIOPSY Left 02/18/2018   Procedure: LEFT BREAST LUMPECTOMY WITH RADIOACTIVE SEED AND LEFT AXILLARY DEEP SENTINEL LYMPH NODE BIOPSY, INJECT BLUE DYE LEFT BREAST;  Surgeon: Fanny Skates, MD;  Location: [facility];  Service: General;  Laterality: Left;   PORTACATH PLACEMENT Right 02/18/2018   Procedure: INSERTION PORT-A-CATH;  Surgeon: Fanny Skates, MD;  Location: [facility];  Service: General;  Laterality: Right;   Right torn meniscus repair x 2     THYROID LOBECTOMY N/A 02/04/2015   Procedure: TOTAL THYROIDECTOMY;  Surgeon: Armandina Gemma, MD;  Location: [facility];  Service: General;  Laterality: N/A;   TONSILLECTOMY     TUMOR REMOVAL     Tumors on arms removed,  Tumor on gum removed,  Tumor removed from inside ear      FAMILY HISTORY Family History  Problem Relation Age of Onset   Ovarian cancer Sister    Lung cancer Sister    Throat cancer Brother    Brain cancer Sister    She notes that her father died from old age at age 59. Patients' mother died from multiple comorbidities at age 49. The patient has 5 brothers and 6 sisters. Patients' sister had ovarian cancer at age 42 and lung cancer at 33, brother with throat cancer at age 45, and sister with brain cancer at age 65.    GYNECOLOGIC HISTORY:  No LMP recorded. Patient has had a hysterectomy. 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 Her husband was a Librarian, academic at [employer].  He now has significant medical issues and the patient is his caregiver her son, Margaret Hodges manages a call center for medical supplies. Her daughter,  Margaret Hodges, is Patient Care surgical coordinator at [employer] [facility] [employer] [facility] [facility] clinic.  The patient has 2 grandchildren. She attends [employer].    ADVANCED DIRECTIVES: Her [place] is her daughter, Margaret Hodges and can be reached at [phone].     HEALTH MAINTENANCE: Social History   Tobacco Use   Smoking status: Current Every Day Smoker    Packs/day: 1.50    Types: Cigarettes   Smokeless tobacco: Never Used  Substance Use Topics   Alcohol use: No   Drug use: No     Colonoscopy: Yes through [facility]  PAP:   Bone density:    Allergies  Allergen Reactions   Allegra [Fexofenadine] Hives   Clindamycin/Lincomycin Hives and Diarrhea   Influenza A (H1n1) Monoval Vac Other (See Comments)    Guillian Barre Syndrome   Keflex [Cephalexin] Hives   Penicillins Swelling    Has patient had a PCN reaction causing immediate rash, facial/tongue/throat swelling, SOB or lightheadedness with hypotension: No Has patient had a PCN reaction causing severe rash involving mucus membranes or skin necrosis: No Has patient had a PCN reaction that required hospitalization No Has patient had a PCN reaction occurring within the last 10 years: No If all of the above answers are "NO", then may  proceed with Cephalosporin use.    Prednisone Other (See Comments)    "heart attack symptoms" but even had heart cath & had no cardiac disease   Protonix [Pantoprazole Sodium] Diarrhea    Tolerates Prilosec   Statins Other (See Comments)    myalgias    Current Outpatient Medications  Medication Sig Dispense Refill   amLODipine (NORVASC) 5 MG tablet Take 1 tablet (5 mg total) by mouth daily. 30 tablet 0   Cholecalciferol (VITAMIN D) 2000 UNITS tablet Take 2,000 Units by mouth daily.     diclofenac sodium (VOLTAREN) 1 % GEL Apply 4 g topically 4 (four) times daily as needed (arthritis pain).      HYDROcodone-acetaminophen (NORCO) 5-325 MG tablet Take 1-2 tablets by mouth every  6 (six) hours as needed for moderate pain or severe pain. 30 tablet 0   hyoscyamine (LEVBID) 0.375 MG 12 hr tablet Take 0.375 mg by mouth every 12 (twelve) hours as needed (blaader spasms).     levothyroxine (SYNTHROID, LEVOTHROID) 100 MCG tablet Take 100 mcg by mouth daily.     omeprazole (PRILOSEC) 40 MG capsule Take 1 capsule (40 mg total) by mouth daily. 60 capsule 4   saccharomyces boulardii (FLORASTOR) 250 MG capsule Take 250 mg by mouth daily as needed (only when on antibiotic).     No current facility-administered medications for this visit.    Facility-Administered Medications Ordered in Other Visits  Medication Dose Route Frequency Provider Last Rate Last Dose   sodium chloride flush (NS) 0.9 % injection 10 mL  10 mL Intracatheter PRN Damion Kant, Virgie Dad, MD   10 mL at 07/12/18 1125    OBJECTIVE: Latest white woman in no acute distress  Vitals:   07/12/18 0847  BP: 137/79  Pulse: 78  Resp: 16  Temp: 98.2 F (36.8 C)  SpO2: 100%     Body mass index is 25.47 kg/m.   Wt Readings from Last 3 Encounters:  07/12/18 167 lb 8 oz (76 kg)  06/06/18 167 lb 6.4 oz (75.9 kg)  05/22/18 166 lb 6 oz (75.5 kg)      ECOG FS:1 - Symptomatic but completely ambulatory  Sclerae unicteric, EOMs intact No cervical or supraclavicular adenopathy Lungs no rales or rhonchi Heart regular rate and rhythm Abd soft, nontender, positive bowel sounds MSK no focal spinal tenderness, no upper extremity lymphedema Neuro: nonfocal, well oriented, appropriate affect Breasts: The right breast is unremarkable.  The left breast is status post lumpectomy and radiation.  There is mild hyperpigmentation.  There is no residual desquamation.  There is no evidence of recurrent disease.  Both axillae are benign.  LAB RESULTS:  CMP     Component Value Date/Time   NA 141 07/12/2018 0840   K 4.3 07/12/2018 0840   CL 108 07/12/2018 0840   CO2 23 07/12/2018 0840   GLUCOSE 94 07/12/2018 0840   BUN 16  07/12/2018 0840   CREATININE 0.78 07/12/2018 0840   CREATININE 0.95 01/30/2018 1205   CALCIUM 9.4 07/12/2018 0840   PROT 7.2 07/12/2018 0840   ALBUMIN 3.6 07/12/2018 0840   AST 14 (L) 07/12/2018 0840   AST 13 (L) 01/30/2018 1205   ALT 18 07/12/2018 0840   ALT 17 01/30/2018 1205   ALKPHOS 99 07/12/2018 0840   BILITOT 0.3 07/12/2018 0840   BILITOT <0.2 (L) 01/30/2018 1205   GFRNONAA >60 07/12/2018 0840   GFRNONAA 59 (L) 01/30/2018 1205   GFRAA >60 07/12/2018 0840   GFRAA >60 01/30/2018 1205  Lab Results  Component Value Date   TOTALPROTELP 7.5 05/08/2017    No results found for: Nils Pyle, [facility]  Lab Results  Component Value Date   WBC 8.1 07/12/2018   NEUTROABS 5.3 07/12/2018   HGB 13.3 07/12/2018   HCT 39.5 07/12/2018   MCV 100.8 (H) 07/12/2018   PLT 273 07/12/2018    '@LASTCHEMISTRY' @  No results found for: LABCA2  No components found for: [id number]  No results for input(s): INR in the last 168 hours.  No results found for: LABCA2  No results found for: CHY850  No results found for: YDX412  No results found for: INO676  No results found for: CA2729  No components found for: HGQUANT  No results found for: CEA1 / No results found for: CEA1   No results found for: AFPTUMOR  No results found for: CHROMOGRNA  No results found for: PSA1  Appointment on 07/12/2018  Component Date Value Ref Range Status   Sodium 07/12/2018 141  135 - 145 mmol/L Final   Potassium 07/12/2018 4.3  3.5 - 5.1 mmol/L Final   Chloride 07/12/2018 108  98 - 111 mmol/L Final   CO2 07/12/2018 23  22 - 32 mmol/L Final   Glucose, Bld 07/12/2018 94  70 - 99 mg/dL Final   BUN 07/12/2018 16  8 - 23 mg/dL Final   Creatinine, Ser 07/12/2018 0.78  0.44 - 1.00 mg/dL Final   Calcium 07/12/2018 9.4  8.9 - 10.3 mg/dL Final   Total Protein 07/12/2018 7.2  6.5 - 8.1 g/dL Final   Albumin 07/12/2018 3.6  3.5 - 5.0 g/dL Final   AST 07/12/2018 14* 15 - 41 U/L  Final   ALT 07/12/2018 18  0 - 44 U/L Final   Alkaline Phosphatase 07/12/2018 99  38 - 126 U/L Final   Total Bilirubin 07/12/2018 0.3  0.3 - 1.2 mg/dL Final   GFR calc non Af Amer 07/12/2018 >60  >60 mL/min Final   GFR calc Af Amer 07/12/2018 >60  >60 mL/min Final   Anion gap 07/12/2018 10  5 - 15 Final   Performed at [facility], [place] [address], [place], [place] [address]   WBC 07/12/2018 8.1  4.0 - 10.5 K/uL Final   RBC 07/12/2018 3.92  3.87 - 5.11 MIL/uL Final   Hemoglobin 07/12/2018 13.3  12.0 - 15.0 g/dL Final   HCT 07/12/2018 39.5  36.0 - 46.0 % Final   MCV 07/12/2018 100.8* 80.0 - 100.0 fL Final   MCH 07/12/2018 33.9  26.0 - 34.0 pg Final   MCHC 07/12/2018 33.7  30.0 - 36.0 g/dL Final   RDW 07/12/2018 12.8  11.5 - 15.5 % Final   Platelets 07/12/2018 273  150 - 400 K/uL Final   nRBC 07/12/2018 0.0  0.0 - 0.2 % Final   Neutrophils Relative % 07/12/2018 66  % Final   Neutro Abs 07/12/2018 5.3  1.7 - 7.7 K/uL Final   Lymphocytes Relative 07/12/2018 19  % Final   Lymphs Abs 07/12/2018 1.6  0.7 - 4.0 K/uL Final   Monocytes Relative 07/12/2018 11  % Final   Monocytes Absolute 07/12/2018 0.9  0.1 - 1.0 K/uL Final   Eosinophils Relative 07/12/2018 3  % Final   Eosinophils Absolute 07/12/2018 0.2  0.0 - 0.5 K/uL Final   Basophils Relative 07/12/2018 1  % Final   Basophils Absolute 07/12/2018 0.1  0.0 - 0.1 K/uL Final   Immature Granulocytes 07/12/2018 0  % Final  Abs Immature Granulocytes 07/12/2018 0.03  0.00 - 0.07 K/uL Final   Performed at [facility], [address] [place],  [address]    (this displays the last labs from the last 3 days)  Lab Results  Component Value Date   TOTALPROTELP 7.5 05/08/2017   (this displays SPEP labs)  No results found for: KPAFRELGTCHN, LAMBDASER, KAPLAMBRATIO (kappa/lambda light chains)  No results found for: HGBA, HGBA2QUANT, HGBFQUANT,  HGBSQUAN (Hemoglobinopathy evaluation)   Lab Results  Component Value Date   LDH 171 05/08/2017    No results found for: IRON, TIBC, IRONPCTSAT (Iron and TIBC)  No results found for: FERRITIN  Urinalysis    Component Value Date/Time   COLORURINE YELLOW 04/04/2014 2112   APPEARANCEUR CLEAR 04/04/2014 2112   LABSPEC 1.017 04/04/2014 2112   PHURINE 6.0 04/04/2014 2112   GLUCOSEU NEGATIVE 04/04/2014 2112   HGBUR NEGATIVE 04/04/2014 2112   BILIRUBINUR NEGATIVE 04/04/2014 2112   [place] NEGATIVE 04/04/2014 2112   PROTEINUR NEGATIVE 04/04/2014 2112   UROBILINOGEN 0.2 04/04/2014 2112   NITRITE NEGATIVE 04/04/2014 2112   LEUKOCYTESUR NEGATIVE 04/04/2014 2112     STUDIES:  No results found.    ELIGIBLE FOR AVAILABLE RESEARCH PROTOCOL: no   ASSESSMENT: 73 y.o. [place] woman status post left breast lower inner quadrant biopsy 01/16/2018 for a clinical T1c N0, stage IA invasive ductal carcinoma, grade 3, estrogen receptor weakly positive, progesterone receptor negative, with an MIB-1 of 80%, and HER-2 amplified  (1) genetics 03/06/2018 showed no deleterious mutations  (2) status post left lumpectomy and sentinel lymph node sampling 02/18/2018 for a pT2 pN0, stage IIA invasive ductal carcinoma, grade 3, with negative margins.  (a) a total of 5 lymph nodes were removed   (3) adjuvant chemo immunotherapy consisting of paclitaxel/trastuzumab weekly for 12 weeks, started 03/08/2018.  (a) paclitaxel discontinued after 6 doses because of peripheral neuropathy; last dose 04/12/2018  (4) trastuzumab continued to complete a year (through December 2020).  (a) echocardiogram 02/06/2018 shows an ejection fraction in the 60-65% range  (b) echocardiogram 06/06/2018 shows an ejection fraction in the 60-65% range.  (5) adjuvant radiation completed 06/26/2018  (6) antiestrogens to follow at the completion of local treatment.  (a) bone density at [facility] 12/27/2017 shows a T  score of -1.7  (7) tobacco abuse disorder:  Margaret Hodges stopped smoking 06/26/2018   PLAN: Lesli has completed local treatment for her breast cancer.  She tolerated her surgery radiation generally well although she is still recovering from the radiation treatments, with some discomfort in the breast, not requiring significant analgesics.  She is taking appropriate pandemic precautions and staying active.  She is now ready to consider antiestrogens. '@PATIENTFIRSTNAME' @   has completed her local treatment and is now ready to start anti-estrogens.  We discussed the difference between tamoxifen and anastrozole in detail. She understands that anastrozole and the aromatase inhibitors in general work by blocking estrogen production. Accordingly vaginal dryness, decrease in bone density, and of course hot flashes can result. The aromatase inhibitors can also negatively affect the cholesterol profile, although that is a minor effect. One out of 5 women on aromatase inhibitors we will feel "old and achy". This arthralgia/myalgia syndrome, which resembles fibromyalgia clinically, does resolve with stopping the medications. Accordingly this is not a reason to not try an aromatase inhibitor but it is a frequent reason to stop it (in other words 20% of women will not be able to tolerate these medications).  Tamoxifen on the other hand  does not block estrogen production. It does not "take away a woman's estrogen". It blocks the estrogen receptor in breast cells. Like anastrozole, it can also cause hot flashes. As opposed to anastrozole, tamoxifen has many estrogen-like effects. It is technically an estrogen receptor modulator. This means that in some tissues tamoxifen works like estrogen-- for example it helps strengthen the bones. It tends to improve the cholesterol profile. It can cause thickening of the endometrial lining, and even endometrial polyps or rarely cancer of the uterus.(The risk of uterine cancer due to  tamoxifen is one additional cancer per thousand women year). It can cause vaginal wetness or stickiness. It can cause blood clots through this estrogen-like effect--the risk of blood clots with tamoxifen is exactly the same as with birth control pills or hormone replacement.  Neither of these agents causes mood changes or weight gain, despite the popular belief that they can have these side effects. We have data from studies comparing either of these drugs with placebo, and in those cases the control group had the same amount of weight gain and depression as the group that took the drug.  After much discussion and particularly because of her concern regarding bone density and because she is status post hysterectomy, we opted for tamoxifen.  She will call if she has any unusual side effects.  At this point I think it is more important for her to not come to the cancer center then to return in 3 weeks for her next trastuzumab and therefore I am postponing her next treatment to May 26.  She will receive a loading dose again on that date  She knows to call for any other issues that may develop before that visit.    Margaret Hodges, Virgie Dad, MD  07/12/18 12:54 PM Medical Oncology and Hematology [facility] [address] [place], [place] [address] Tel. [phone]    Fax. [phone]  I, Jacqualyn Posey am acting as a Education administrator for Chauncey Cruel, MD.   I, Lurline Del MD, have reviewed the above documentation for accuracy and completeness, and I agree with the above.

## 2018-07-12 ENCOUNTER — Other Ambulatory Visit: Payer: Self-pay

## 2018-07-12 ENCOUNTER — Inpatient Hospital Stay: Payer: Medicare Other

## 2018-07-12 ENCOUNTER — Inpatient Hospital Stay (HOSPITAL_BASED_OUTPATIENT_CLINIC_OR_DEPARTMENT_OTHER): Payer: Medicare Other | Admitting: Oncology

## 2018-07-12 ENCOUNTER — Inpatient Hospital Stay: Payer: Medicare Other | Attending: Oncology

## 2018-07-12 VITALS — BP 137/79 | HR 78 | Temp 98.2°F | Resp 16 | Ht 68.0 in | Wt 167.5 lb

## 2018-07-12 DIAGNOSIS — Z809 Family history of malignant neoplasm, unspecified: Secondary | ICD-10-CM | POA: Diagnosis not present

## 2018-07-12 DIAGNOSIS — Z170 Estrogen receptor positive status [ER+]: Secondary | ICD-10-CM

## 2018-07-12 DIAGNOSIS — Z79899 Other long term (current) drug therapy: Secondary | ICD-10-CM

## 2018-07-12 DIAGNOSIS — Z808 Family history of malignant neoplasm of other organs or systems: Secondary | ICD-10-CM | POA: Insufficient documentation

## 2018-07-12 DIAGNOSIS — Z791 Long term (current) use of non-steroidal anti-inflammatories (NSAID): Secondary | ICD-10-CM | POA: Diagnosis not present

## 2018-07-12 DIAGNOSIS — Z801 Family history of malignant neoplasm of trachea, bronchus and lung: Secondary | ICD-10-CM

## 2018-07-12 DIAGNOSIS — Z720 Tobacco use: Secondary | ICD-10-CM

## 2018-07-12 DIAGNOSIS — Z8041 Family history of malignant neoplasm of ovary: Secondary | ICD-10-CM

## 2018-07-12 DIAGNOSIS — F17210 Nicotine dependence, cigarettes, uncomplicated: Secondary | ICD-10-CM | POA: Insufficient documentation

## 2018-07-12 DIAGNOSIS — M8580 Other specified disorders of bone density and structure, unspecified site: Secondary | ICD-10-CM | POA: Diagnosis not present

## 2018-07-12 DIAGNOSIS — C50312 Malignant neoplasm of lower-inner quadrant of left female breast: Secondary | ICD-10-CM

## 2018-07-12 DIAGNOSIS — Z95828 Presence of other vascular implants and grafts: Secondary | ICD-10-CM

## 2018-07-12 DIAGNOSIS — Z90710 Acquired absence of both cervix and uterus: Secondary | ICD-10-CM

## 2018-07-12 DIAGNOSIS — Z5112 Encounter for antineoplastic immunotherapy: Secondary | ICD-10-CM | POA: Insufficient documentation

## 2018-07-12 LAB — CBC WITH DIFFERENTIAL/PLATELET
Abs Immature Granulocytes: 0.03 10*3/uL (ref 0.00–0.07)
Basophils Absolute: 0.1 10*3/uL (ref 0.0–0.1)
Basophils Relative: 1 %
Eosinophils Absolute: 0.2 10*3/uL (ref 0.0–0.5)
Eosinophils Relative: 3 %
HCT: 39.5 % (ref 36.0–46.0)
Hemoglobin: 13.3 g/dL (ref 12.0–15.0)
Immature Granulocytes: 0 %
Lymphocytes Relative: 19 %
Lymphs Abs: 1.6 10*3/uL (ref 0.7–4.0)
MCH: 33.9 pg (ref 26.0–34.0)
MCHC: 33.7 g/dL (ref 30.0–36.0)
MCV: 100.8 fL — ABNORMAL HIGH (ref 80.0–100.0)
Monocytes Absolute: 0.9 10*3/uL (ref 0.1–1.0)
Monocytes Relative: 11 %
Neutro Abs: 5.3 10*3/uL (ref 1.7–7.7)
Neutrophils Relative %: 66 %
Platelets: 273 10*3/uL (ref 150–400)
RBC: 3.92 MIL/uL (ref 3.87–5.11)
RDW: 12.8 % (ref 11.5–15.5)
WBC: 8.1 10*3/uL (ref 4.0–10.5)
nRBC: 0 % (ref 0.0–0.2)

## 2018-07-12 LAB — COMPREHENSIVE METABOLIC PANEL
ALT: 18 U/L (ref 0–44)
AST: 14 U/L — ABNORMAL LOW (ref 15–41)
Albumin: 3.6 g/dL (ref 3.5–5.0)
Alkaline Phosphatase: 99 U/L (ref 38–126)
Anion gap: 10 (ref 5–15)
BUN: 16 mg/dL (ref 8–23)
CO2: 23 mmol/L (ref 22–32)
Calcium: 9.4 mg/dL (ref 8.9–10.3)
Chloride: 108 mmol/L (ref 98–111)
Creatinine, Ser: 0.78 mg/dL (ref 0.44–1.00)
GFR calc Af Amer: >60 mL/min (ref >60)
GFR calc non Af Amer: >60 mL/min (ref >60)
Glucose, Bld: 94 mg/dL (ref 70–99)
Potassium: 4.3 mmol/L (ref 3.5–5.1)
Sodium: 141 mmol/L (ref 135–145)
Total Bilirubin: 0.3 mg/dL (ref 0.3–1.2)
Total Protein: 7.2 g/dL (ref 6.5–8.1)

## 2018-07-12 MED ORDER — TRASTUZUMAB CHEMO 150 MG IV SOLR
450.0000 mg | Freq: Once | INTRAVENOUS | Status: AC
  Administered 2018-07-12: 450 mg via INTRAVENOUS
  Filled 2018-07-12: qty 21.43

## 2018-07-12 MED ORDER — TAMOXIFEN CITRATE 20 MG PO TABS: 20.0000 mg | ORAL_TABLET | Freq: Every day | ORAL | 12 refills | Status: AC

## 2018-07-12 MED ORDER — ACETAMINOPHEN 325 MG PO TABS
650.0000 mg | ORAL_TABLET | Freq: Once | ORAL | Status: AC
  Administered 2018-07-12: 650 mg via ORAL

## 2018-07-12 MED ORDER — SODIUM CHLORIDE 0.9 % IV SOLN
Freq: Once | INTRAVENOUS | Status: AC
  Administered 2018-07-12: 10:00:00 via INTRAVENOUS
  Filled 2018-07-12: qty 250

## 2018-07-12 MED ORDER — DIPHENHYDRAMINE HCL 25 MG PO CAPS
ORAL_CAPSULE | ORAL | Status: AC
  Filled 2018-07-12: qty 1

## 2018-07-12 MED ORDER — SODIUM CHLORIDE 0.9% FLUSH
10.0000 mL | INTRAVENOUS | Status: DC | PRN
  Administered 2018-07-12: 10 mL
  Filled 2018-07-12: qty 10

## 2018-07-12 MED ORDER — DIPHENHYDRAMINE HCL 25 MG PO CAPS
25.0000 mg | ORAL_CAPSULE | Freq: Once | ORAL | Status: AC
  Administered 2018-07-12: 25 mg via ORAL

## 2018-07-12 MED ORDER — ACETAMINOPHEN 325 MG PO TABS
ORAL_TABLET | ORAL | Status: AC
  Filled 2018-07-12: qty 2

## 2018-07-12 MED ORDER — HEPARIN SOD (PORK) LOCK FLUSH 100 UNIT/ML IV SOLN
500.0000 [IU] | Freq: Once | INTRAVENOUS | Status: AC | PRN
  Administered 2018-07-12: 500 [IU]
  Filled 2018-07-12: qty 5

## 2018-07-12 MED ORDER — TAMOXIFEN CITRATE 20 MG PO TABS: 20.0000 mg | ORAL_TABLET | Freq: Every day | ORAL | 12 refills | Status: DC

## 2018-07-12 NOTE — Patient Instructions (Signed)
[facility] Discharge Instructions for Patients Receiving Chemotherapy  Today you received the following chemotherapy agents Herceptin  To help prevent nausea and vomiting after your treatment, we encourage you to take your nausea medication as directed   If you develop nausea and vomiting that is not controlled by your nausea medication, call the clinic.   BELOW ARE SYMPTOMS THAT SHOULD BE REPORTED IMMEDIATELY:  *FEVER GREATER THAN 100.5 F  *CHILLS WITH OR WITHOUT FEVER  NAUSEA AND VOMITING THAT IS NOT CONTROLLED WITH YOUR NAUSEA MEDICATION  *UNUSUAL SHORTNESS OF BREATH  *UNUSUAL BRUISING OR BLEEDING  TENDERNESS IN MOUTH AND THROAT WITH OR WITHOUT PRESENCE OF ULCERS  *URINARY PROBLEMS  *BOWEL PROBLEMS  UNUSUAL RASH Items with * indicate a potential emergency and should be followed up as soon as possible.  Feel free to call the clinic should you have any questions or concerns. The clinic phone number is [phone].  Please show the CHEMO ALERT CARD at check-in to the Emergency Department and triage nurse.   

## 2018-07-15 ENCOUNTER — Telehealth: Payer: Self-pay | Admitting: Oncology

## 2018-07-15 DIAGNOSIS — G894 Chronic pain syndrome: Secondary | ICD-10-CM | POA: Diagnosis not present

## 2018-07-15 DIAGNOSIS — S70362A Insect bite (nonvenomous), left thigh, initial encounter: Secondary | ICD-10-CM | POA: Diagnosis not present

## 2018-07-15 DIAGNOSIS — W57XXXA Bitten or stung by nonvenomous insect and other nonvenomous arthropods, initial encounter: Secondary | ICD-10-CM | POA: Diagnosis not present

## 2018-07-15 DIAGNOSIS — I10 Essential (primary) hypertension: Secondary | ICD-10-CM | POA: Diagnosis not present

## 2018-07-15 DIAGNOSIS — C50912 Malignant neoplasm of unspecified site of left female breast: Secondary | ICD-10-CM | POA: Diagnosis not present

## 2018-07-15 DIAGNOSIS — E039 Hypothyroidism, unspecified: Secondary | ICD-10-CM | POA: Diagnosis not present

## 2018-07-15 NOTE — Telephone Encounter (Signed)
Scheduled appt per sch msg request. Mailed printout.

## 2018-07-18 NOTE — Progress Notes (Signed)
  Radiation Oncology         [phone] ________________________________  Name: Margaret Hodges MRN: [id number]  Date: 06/26/2018  DOB: 1946/03/19  End of Treatment Note  Diagnosis:   73 y.o. female with Stage IIA, pT2N0M0, grade 3 ER positive, HER2 amplified invasive ductal carcinoma of the left breast  Indication for treatment:  Curative       Radiation treatment dates:   05/30/2018 - 06/26/2018  Site/dose:   The patient initially received a dose of 42.56 Gy in 16 fractions to the left breast using whole-breast tangent fields. This was delivered using a 3-D conformal technique. The patient then received a boost. This delivered an additional 8 Gy in 4 fractions using a 3 field photon technique. The total dose was 50.56 Gy.  Narrative: The patient tolerated radiation treatment relatively well.   The patient had some expected skin irritation with erythema as she progressed during treatment. Moist desquamation was not present at the end of treatment. She continues to use her Radiaplex gel as directed. She also noted increased fatigue and some breast pain.  Plan: The patient has completed radiation treatment. The patient will return to radiation oncology clinic for routine followup in one month. I advised the patient to call or return sooner if they have any questions or concerns related to their recovery or treatment. ________________________________  Jodelle Gross, MD, PhD  This document serves as a record of services personally performed by Kyung Rudd, MD. It was created on his behalf by Rae Lips, a trained medical scribe. The creation of this record is based on the scribe's personal observations and the provider's statements to them. This document has been checked and approved by the attending provider.

## 2018-07-24 ENCOUNTER — Telehealth: Payer: Self-pay | Admitting: Radiation Oncology

## 2018-07-24 NOTE — Telephone Encounter (Signed)
  Radiation Oncology         [phone] ________________________________  Name: Margaret Hodges MRN: [id number]  Date of Service: 07/24/2018  DOB: Nov 11, 1945  Post Treatment Telephone Note  Diagnosis:  Stage IIA,pT2N0M0, grade 3 ER positive, HER2 amplified invasive ductal carcinoma of the left breast.  Interval Since Last Radiation:  4 weeks   05/30/2018 - 06/26/2018: The patient initially received a dose of 42.56 Gy in 16 fractions to the left breast using whole-breast tangent fields. This was delivered using a 3-D conformal technique. The patient then received a boost. This delivered an additional 8 Gy in 4 fractions using a 3 field photon technique. The total dose was 50.56 Gy.  Narrative:  The patient was contacted today for routine follow-up. During treatment she did very well with radiotherapy and did not have significant desquamation. She reports she is doing well overall and that her skin is nearly back to normal color. She is continuing with Herceptin and will begin Tamoxifen as well.  Impression/Plan: 1. Stage IIA,pT2N0M0, grade 3 ER positive, HER2 amplified invasive ductal carcinoma of the left breast. The patient has been doing well since completion of radiotherapy. We discussed that we would be happy to continue to follow her as needed, but she will also continue to follow up with Dr. Gaylyn Cheers in medical oncology. She was counseled on skin care as well as measures to avoid sun exposure to this area.  2. Survivorship. We discussed the importance of survivorship evaluation and is aware of the monthly resource calendar for the cancer center.     Carola Rhine, PAC

## 2018-07-25 ENCOUNTER — Telehealth: Payer: Self-pay | Admitting: Oncology

## 2018-07-25 NOTE — Telephone Encounter (Signed)
Returned patient's phone call regarding rescheduling an appointment, patient informed me they are only capable to come on Friday's. Rescheduled appointments to only Friday per patient's request.

## 2018-08-02 ENCOUNTER — Ambulatory Visit: Payer: Medicare Other

## 2018-08-02 ENCOUNTER — Other Ambulatory Visit: Payer: Medicare Other

## 2018-08-20 ENCOUNTER — Other Ambulatory Visit: Payer: Medicare Other

## 2018-08-20 ENCOUNTER — Ambulatory Visit: Payer: Medicare Other

## 2018-08-23 ENCOUNTER — Other Ambulatory Visit: Payer: Self-pay | Admitting: Oncology

## 2018-08-23 ENCOUNTER — Inpatient Hospital Stay: Payer: Medicare Other

## 2018-08-23 ENCOUNTER — Other Ambulatory Visit: Payer: Self-pay

## 2018-08-23 ENCOUNTER — Inpatient Hospital Stay: Payer: Medicare Other | Attending: Oncology

## 2018-08-23 VITALS — BP 147/72 | HR 73 | Temp 98.7°F | Resp 18

## 2018-08-23 DIAGNOSIS — Z5112 Encounter for antineoplastic immunotherapy: Secondary | ICD-10-CM | POA: Insufficient documentation

## 2018-08-23 DIAGNOSIS — H04123 Dry eye syndrome of bilateral lacrimal glands: Secondary | ICD-10-CM | POA: Diagnosis not present

## 2018-08-23 DIAGNOSIS — C50312 Malignant neoplasm of lower-inner quadrant of left female breast: Secondary | ICD-10-CM

## 2018-08-23 DIAGNOSIS — H2013 Chronic iridocyclitis, bilateral: Secondary | ICD-10-CM | POA: Diagnosis not present

## 2018-08-23 DIAGNOSIS — Z95828 Presence of other vascular implants and grafts: Secondary | ICD-10-CM

## 2018-08-23 DIAGNOSIS — Z170 Estrogen receptor positive status [ER+]: Secondary | ICD-10-CM

## 2018-08-23 DIAGNOSIS — Z720 Tobacco use: Secondary | ICD-10-CM

## 2018-08-23 DIAGNOSIS — H43811 Vitreous degeneration, right eye: Secondary | ICD-10-CM | POA: Diagnosis not present

## 2018-08-23 LAB — CBC WITH DIFFERENTIAL/PLATELET
Abs Immature Granulocytes: 0.03 10*3/uL (ref 0.00–0.07)
Basophils Absolute: 0.1 10*3/uL (ref 0.0–0.1)
Basophils Relative: 1 %
Eosinophils Absolute: 0.2 10*3/uL (ref 0.0–0.5)
Eosinophils Relative: 2 %
HCT: 41.2 % (ref 36.0–46.0)
Hemoglobin: 13.7 g/dL (ref 12.0–15.0)
Immature Granulocytes: 0 %
Lymphocytes Relative: 19 %
Lymphs Abs: 1.9 10*3/uL (ref 0.7–4.0)
MCH: 32.9 pg (ref 26.0–34.0)
MCHC: 33.3 g/dL (ref 30.0–36.0)
MCV: 98.8 fL (ref 80.0–100.0)
Monocytes Absolute: 1 10*3/uL (ref 0.1–1.0)
Monocytes Relative: 10 %
Neutro Abs: 6.9 10*3/uL (ref 1.7–7.7)
Neutrophils Relative %: 68 %
Platelets: 279 10*3/uL (ref 150–400)
RBC: 4.17 MIL/uL (ref 3.87–5.11)
RDW: 13.4 % (ref 11.5–15.5)
WBC: 10.1 10*3/uL (ref 4.0–10.5)
nRBC: 0 % (ref 0.0–0.2)

## 2018-08-23 LAB — CMP (CANCER CENTER ONLY)
ALT: 21 U/L (ref 0–44)
AST: 15 U/L (ref 15–41)
Albumin: 3.8 g/dL (ref 3.5–5.0)
Alkaline Phosphatase: 110 U/L (ref 38–126)
Anion gap: 8 (ref 5–15)
BUN: 13 mg/dL (ref 8–23)
CO2: 25 mmol/L (ref 22–32)
Calcium: 9.5 mg/dL (ref 8.9–10.3)
Chloride: 106 mmol/L (ref 98–111)
Creatinine: 0.82 mg/dL (ref 0.44–1.00)
GFR, Est AFR Am: >60 mL/min (ref >60)
GFR, Estimated: >60 mL/min (ref >60)
Glucose, Bld: 92 mg/dL (ref 70–99)
Potassium: 4.4 mmol/L (ref 3.5–5.1)
Sodium: 139 mmol/L (ref 135–145)
Total Bilirubin: 0.3 mg/dL (ref 0.3–1.2)
Total Protein: 7.2 g/dL (ref 6.5–8.1)

## 2018-08-23 MED ORDER — SODIUM CHLORIDE 0.9% FLUSH
10.0000 mL | INTRAVENOUS | Status: DC | PRN
  Administered 2018-08-23: 11:00:00 10 mL
  Filled 2018-08-23: qty 10

## 2018-08-23 MED ORDER — SODIUM CHLORIDE 0.9 % IV SOLN
Freq: Once | INTRAVENOUS | Status: AC
  Administered 2018-08-23: 09:00:00 via INTRAVENOUS
  Filled 2018-08-23: qty 250

## 2018-08-23 MED ORDER — DIPHENHYDRAMINE HCL 25 MG PO CAPS
25.0000 mg | ORAL_CAPSULE | Freq: Once | ORAL | Status: AC
  Administered 2018-08-23: 09:00:00 25 mg via ORAL

## 2018-08-23 MED ORDER — SODIUM CHLORIDE 0.9% FLUSH
10.0000 mL | INTRAVENOUS | Status: DC | PRN
  Administered 2018-08-23: 10 mL
  Filled 2018-08-23: qty 10

## 2018-08-23 MED ORDER — ACETAMINOPHEN 325 MG PO TABS
ORAL_TABLET | ORAL | Status: AC
  Filled 2018-08-23: qty 2

## 2018-08-23 MED ORDER — TRASTUZUMAB CHEMO 150 MG IV SOLR
600.0000 mg | Freq: Once | INTRAVENOUS | Status: AC
  Administered 2018-08-23: 600 mg via INTRAVENOUS
  Filled 2018-08-23: qty 28.57

## 2018-08-23 MED ORDER — DIPHENHYDRAMINE HCL 25 MG PO CAPS
ORAL_CAPSULE | ORAL | Status: AC
  Filled 2018-08-23: qty 1

## 2018-08-23 MED ORDER — ACETAMINOPHEN 325 MG PO TABS
650.0000 mg | ORAL_TABLET | Freq: Once | ORAL | Status: AC
  Administered 2018-08-23: 650 mg via ORAL

## 2018-08-23 MED ORDER — HEPARIN SOD (PORK) LOCK FLUSH 100 UNIT/ML IV SOLN
500.0000 [IU] | Freq: Once | INTRAVENOUS | Status: AC | PRN
  Administered 2018-08-23: 500 [IU]
  Filled 2018-08-23: qty 5

## 2018-08-23 NOTE — Patient Instructions (Signed)
Cancer Center Discharge Instructions for Patients Receiving Chemotherapy  Today you received the following chemotherapy agents Herceptin  To help prevent nausea and vomiting after your treatment, we encourage you to take your nausea medication as directed   If you develop nausea and vomiting that is not controlled by your nausea medication, call the clinic.   BELOW ARE SYMPTOMS THAT SHOULD BE REPORTED IMMEDIATELY:  *FEVER GREATER THAN 100.5 F  *CHILLS WITH OR WITHOUT FEVER  NAUSEA AND VOMITING THAT IS NOT CONTROLLED WITH YOUR NAUSEA MEDICATION  *UNUSUAL SHORTNESS OF BREATH  *UNUSUAL BRUISING OR BLEEDING  TENDERNESS IN MOUTH AND THROAT WITH OR WITHOUT PRESENCE OF ULCERS  *URINARY PROBLEMS  *BOWEL PROBLEMS  UNUSUAL RASH Items with * indicate a potential emergency and should be followed up as soon as possible.  Feel free to call the clinic should you have any questions or concerns. The clinic phone number is [phone].  Please show the CHEMO ALERT CARD at check-in to the Emergency Department and triage nurse.   

## 2018-09-09 ENCOUNTER — Other Ambulatory Visit: Payer: Self-pay

## 2018-09-09 ENCOUNTER — Ambulatory Visit (HOSPITAL_BASED_OUTPATIENT_CLINIC_OR_DEPARTMENT_OTHER)
Admission: RE | Admit: 2018-09-09 | Discharge: 2018-09-09 | Disposition: A | Discharge status: Home / Self Care | Payer: Medicare Other | Source: Ambulatory Visit | Attending: Internal Medicine | Admitting: Internal Medicine

## 2018-09-09 ENCOUNTER — Ambulatory Visit (HOSPITAL_COMMUNITY)
Admission: RE | Admit: 2018-09-09 | Discharge: 2018-09-09 | Disposition: A | Discharge status: Home / Self Care | Payer: Medicare Other | Source: Ambulatory Visit | Attending: Family Medicine | Admitting: Family Medicine

## 2018-09-09 ENCOUNTER — Encounter (HOSPITAL_COMMUNITY): Payer: Self-pay | Admitting: Internal Medicine

## 2018-09-09 VITALS — BP 140/78 | HR 76 | Wt 172.2 lb

## 2018-09-09 DIAGNOSIS — C50312 Malignant neoplasm of lower-inner quadrant of left female breast: Secondary | ICD-10-CM

## 2018-09-09 DIAGNOSIS — K219 Gastro-esophageal reflux disease without esophagitis: Secondary | ICD-10-CM | POA: Diagnosis not present

## 2018-09-09 DIAGNOSIS — F17210 Nicotine dependence, cigarettes, uncomplicated: Secondary | ICD-10-CM | POA: Insufficient documentation

## 2018-09-09 DIAGNOSIS — I10 Essential (primary) hypertension: Secondary | ICD-10-CM | POA: Diagnosis not present

## 2018-09-09 DIAGNOSIS — Z888 Allergy status to other drugs, medicaments and biological substances status: Secondary | ICD-10-CM | POA: Insufficient documentation

## 2018-09-09 DIAGNOSIS — Z801 Family history of malignant neoplasm of trachea, bronchus and lung: Secondary | ICD-10-CM | POA: Diagnosis not present

## 2018-09-09 DIAGNOSIS — J449 Chronic obstructive pulmonary disease, unspecified: Secondary | ICD-10-CM | POA: Insufficient documentation

## 2018-09-09 DIAGNOSIS — Z887 Allergy status to serum and vaccine status: Secondary | ICD-10-CM | POA: Diagnosis not present

## 2018-09-09 DIAGNOSIS — Z79899 Other long term (current) drug therapy: Secondary | ICD-10-CM | POA: Insufficient documentation

## 2018-09-09 DIAGNOSIS — Z79890 Hormone replacement therapy (postmenopausal): Secondary | ICD-10-CM | POA: Diagnosis not present

## 2018-09-09 DIAGNOSIS — Z170 Estrogen receptor positive status [ER+]: Secondary | ICD-10-CM | POA: Diagnosis not present

## 2018-09-09 DIAGNOSIS — Z881 Allergy status to other antibiotic agents status: Secondary | ICD-10-CM | POA: Diagnosis not present

## 2018-09-09 DIAGNOSIS — Z880 Allergy status to penicillin: Secondary | ICD-10-CM | POA: Insufficient documentation

## 2018-09-09 DIAGNOSIS — Z8041 Family history of malignant neoplasm of ovary: Secondary | ICD-10-CM | POA: Insufficient documentation

## 2018-09-09 DIAGNOSIS — Z808 Family history of malignant neoplasm of other organs or systems: Secondary | ICD-10-CM | POA: Insufficient documentation

## 2018-09-09 NOTE — Patient Instructions (Signed)
Follow up and echo in 3 months.  

## 2018-09-09 NOTE — Progress Notes (Signed)
Cardio-Oncology Clinic Consult Note   Referring Physician: Dr. Jana Hakim Primary Care: [name], MD Primary Cardiologist: New  HPI:  Margaret Hodges is a 72 y.o. female with past medical history of COPD with ongoing tobacco use, HTN, Guillane Barre syndrome and left breast cancer who has been referred by Dr. Jana Hakim to establish in the cardio-oncology clinic for monitoring of cardio-toxicity while undergoing chemotherapy.  Oncologic history 73 y.o. [place] woman status post left breast lower inner quadrant biopsy 01/16/2018 for a clinical T1c N0, stage IA invasive ductal carcinoma, grade 3, estrogen receptor weakly positive, progesterone receptor negative, with an MIB-1 of 80%, and HER-2 amplified  (1) Genetics 03/06/2018 showed no deleterious mutations  (2) Status post left lumpectomy and sentinel lymph node sampling 02/18/2018 for a pT2 pN0, stage IIA invasive ductal carcinoma, grade 3, with negative margins.             (a) a total of 5 lymph nodes were removed   (3) Adjuvant chemo immunotherapy consisting of paclitaxel/trastuzumab weekly for 12 weeks, started 03/08/2018.             (a) paclitaxel discontinued after 6 doses because of peripheral neuropathy; last dose 04/12/2018  (4) Trastuzumab to be continued to complete a year  (5) Adjuvant radiation started 05/31/18 and completed  (6) Antiestrogens to follow at the completion of local treatment.             (a) bone density at [facility] 12/27/2017 shows a T score of -1.7  She returns for routine f/u. Quit smoking for 7 weeks but restarted. Now smoking a few cigarettes per day on average as she is under a lot of stress.(Husband terminal with SNp) . Tolerating herceptin fairly well. Gets some GI side effects and bone pain. No HF symptoms. Stays realtively active. Gets SOB occasionally when walking up steps. No CP, edema, orthopnea or PND.    Echo 06/06/18 60-65%, Personally reviewed Echo 02/06/2018 LVEF  60-65%, GLS -22.1%, No AI, No MR.   Past Medical History:  Diagnosis Date  . Anginal pain ([place])    with admission to hospital due to high blood pressure  . Complication of anesthesia   . Family history of ovarian cancer   . GERD (gastroesophageal reflux disease)   . Guillain Barr syndrome ([place]) 1959  . Guillain Barr syndrome ([place]) 1959  . Guillain Barr syndrome ([place]) 1959  . Headache    sinus headaches  . History of hiatal hernia    was told pt. had one, but no testing  . Hypertensive urgency 04/05/2014  . Malignant neoplasm of lower-inner quadrant of left breast in female, estrogen receptor positive ([place]) 01/24/2018  . Neuromuscular disorder ([place])    Guillian- Barre Syndrome  . Pneumonia    hx. of walking pneumonia  . PONV (postoperative nausea and vomiting)    Current Outpatient Medications  Medication Sig Dispense Refill  . amLODipine (NORVASC) 5 MG tablet Take 1 tablet (5 mg total) by mouth daily. 30 tablet 0  . Cholecalciferol (VITAMIN D) 2000 UNITS tablet Take 2,000 Units by mouth daily.    . diclofenac sodium (VOLTAREN) 1 % GEL Apply 4 g topically 4 (four) times daily as needed (arthritis pain).     Marland Kitchen HYDROcodone-acetaminophen (NORCO) 5-325 MG tablet Take 1-2 tablets by mouth every 6 (six) hours as needed for moderate pain or severe pain. 30 tablet 0  . hyoscyamine (LEVBID) 0.375 MG 12 hr tablet Take 0.375 mg by mouth every 12 (twelve) hours as  needed (blaader spasms).    Marland Kitchen levothyroxine (SYNTHROID, LEVOTHROID) 100 MCG tablet Take 100 mcg by mouth daily.    Marland Kitchen omeprazole (PRILOSEC) 40 MG capsule Take 1 capsule (40 mg total) by mouth daily. 60 capsule 4  . saccharomyces boulardii (FLORASTOR) 250 MG capsule Take 250 mg by mouth daily as needed (only when on antibiotic).     No current facility-administered medications for this encounter.    Allergies  Allergen Reactions  . Allegra [Fexofenadine] Hives  . Clindamycin/Lincomycin Hives and Diarrhea  . Influenza A (H1n1)  [place] Vac Other (See Comments)    Guillian Barre Syndrome  . Keflex [Cephalexin] Hives  . Penicillins Swelling    Has patient had a PCN reaction causing immediate rash, facial/tongue/throat swelling, SOB or lightheadedness with hypotension: No Has patient had a PCN reaction causing severe rash involving mucus membranes or skin necrosis: No Has patient had a PCN reaction that required hospitalization No Has patient had a PCN reaction occurring within the last 10 years: No If all of the above answers are "NO", then may proceed with Cephalosporin use.   . Prednisone Other (See Comments)    "heart attack symptoms" but even had heart cath & had no cardiac disease  . Protonix [Pantoprazole Sodium] Diarrhea    Tolerates Prilosec  . Statins Other (See Comments)    myalgias   Social History   Socioeconomic History  . Marital status: Married    Spouse name: Not on file  . Number of children: Not on file  . Years of education: Not on file  . Highest education level: Not on file  Occupational History  . Not on file  Social Needs  . Financial resource strain: Not on file  . Food insecurity    Worry: Not on file    Inability: Not on file  . Transportation needs    Medical: Not on file    Non-medical: Not on file  Tobacco Use  . Smoking status: Current Every Day Smoker    Packs/day: 1.50    Types: Cigarettes  . Smokeless tobacco: Never Used  Substance and Sexual Activity  . Alcohol use: No  . Drug use: No  . Sexual activity: Not Currently  Lifestyle  . Physical activity    Days per week: Not on file    Minutes per session: Not on file  . Stress: Not on file  Relationships  . Social Herbalist on phone: Not on file    Gets together: Not on file    Attends religious service: Not on file    Active member of club or organization: Not on file    Attends meetings of clubs or organizations: Not on file    Relationship status: Not on file  . Intimate partner violence     Fear of current or ex partner: Not on file    Emotionally abused: Not on file    Physically abused: Not on file    Forced sexual activity: Not on file  Other Topics Concern  . Not on file  Social History Narrative  . Not on file    Family History  Problem Relation Age of Onset  . Ovarian cancer Sister   . Lung cancer Sister   . Throat cancer Brother   . Brain cancer Sister    Vitals:   09/09/18 1111  BP: 140/78  Pulse: 76  SpO2: 96%  Weight: 78.1 kg (172 lb 3.2 oz)   PHYSICAL EXAM: General:  Well appearing. No resp difficulty HEENT: normal Neck: supple. no JVD. Carotids 2+ bilat; no bruits. No lymphadenopathy or thryomegaly appreciated.  Cor: PMI nondisplaced. Regular rate & rhythm. No rubs, gallops or murmurs. Right port-a-cath. Lungs: clear Abdomen: soft, nontender, nondistended. No hepatosplenomegaly. No bruits or masses. Good bowel sounds. Extremities: no cyanosis, clubbing, rash, edema Neuro: alert & orientedx3, cranial nerves grossly intact. moves all 4 extremities w/o difficulty. Affect pleasant  ASSESSMENT & PLAN:  1. Left breast cancer - Invasive ductal carcinoma, grade III. Prognostic indicators significant for: estrogen receptor, 60% positive, with weak staining intensity; progesterone receptor negative, Proliferation marker Ki67 at 80%. HER2 positive by immunohistochemistry, 3+. - I reviewed echos personally. EF and Doppler parameters stable. No HF on exam. Continue Herceptin.    2. Tobacco abuse - Continues to smoke but has cut back. Counseled on smoking cessation/reduction.   Glori Bickers, MD 09/09/18

## 2018-09-09 NOTE — Progress Notes (Signed)
[facility]  Telephone:([phone]) [phone] Fax:([phone]) [phone]     ID: Margaret Hodges DOB: 1945-04-01  MR#: [id number]  PXT#:[id number]  Patient Care Team: [name], MD as PCP - General (Family Medicine) Fanny Skates, MD as Consulting Physician (General Surgery) Magrinat, Virgie Dad, MD as Consulting Physician (Oncology) Kyung Rudd, MD as Consulting Physician (Radiation Oncology) Allyn Kenner, MD (Dermatology) Larey Dresser, MD as Consulting Physician (Cardiology) Bensimhon, Shaune Pascal, MD as Consulting Physician (Cardiology) OTHER MD:   CHIEF COMPLAINT: Estrogen receptor positive breast cancer  CURRENT TREATMENT: trastuzumab; anastrozole   INTERVAL HISTORY: Margaret Hodges returns today for follow-up and treatment of her estrogen receptor positive breast cancer.    She continues on trastuzumab. She Hodges fatigue, nausea, and bone pain. She notes the fatigue hits her in the afternoon. For nausea, she took Pepcid on top of her compazine.  Margaret Hodges last echocardiogram on 09/09/2018 showed an ejection fraction in the 60% - 65% range, which is stable from 05/2018.   She opted not to take tamoxifen due to side effects concern. She Hodges already having hot flashes.  She is worried about clotting issues  Her most recent mammogram was 01/03/2018.   REVIEW OF SYSTEMS: Margaret Hodges rashes on her hands and feet. She states she has blisters. She notes she has had the rashes previously and was told it was psoriasis in the past. She Hodges tenderness and shooting pains to her left breast, as expected. She experienced relief of the tenderness with her compression bra. She notes she has arthritis in her hips, back, and some in her hands. Her husband is terminal, and she is his caregiver. So she does everything around the house-- cleaning, cooking, yard work. They have been married 77 years. She states she continues to smoke but notes the amount is less than it used to be. A detailed review  of systems was otherwise entirely negative.   HISTORY OF CURRENT ILLNESS: From the original intake note:  Margaret Hodges had routine screening mammography on 01/03/2018 showing a possible abnormality in the left breast. She underwent bilateral diagnostic mammography with tomography and left breast ultrasonography at [facility] on 01/15/2018 showing: Breast density category B; a 1.3 cm round solid mass with an indistinct margin in the left breast at 7 o'clock posterior 8 cm from the nipple. No significant abnormalities found in the left axilla.  Accordingly on 01/16/2018 she proceeded to biopsy of the left breast area in question. The pathology from this procedure showed [id number]): Invasive ductal carcinoma, grade III. Prognostic indicators significant for: estrogen receptor, 60% positive, with weak staining intensity; progesterone receptor negative, Proliferation marker Ki67 at 80%. HER2 positive by immunohistochemistry, 3+.  The patient's subsequent history is as detailed below.   PAST MEDICAL HISTORY: Past Medical History:  Diagnosis Date  . Anginal pain ([place])    with admission to hospital due to high blood pressure  . Complication of anesthesia   . Family history of ovarian cancer   . GERD (gastroesophageal reflux disease)   . Guillain Barr syndrome ([place]) 1959  . Guillain Barr syndrome ([place]) 1959  . Guillain Barr syndrome ([place]) 1959  . Headache    sinus headaches  . History of hiatal hernia    was told pt. had one, but no testing  . Hypertensive urgency 04/05/2014  . Malignant neoplasm of lower-inner quadrant of left breast in female, estrogen receptor positive ([place]) 01/24/2018  . Neuromuscular disorder ([place])    Guillian- Barre Syndrome  . Pneumonia  hx. of walking pneumonia  . PONV (postoperative nausea and vomiting)     PAST SURGICAL HISTORY: Past Surgical History:  Procedure Laterality Date  . ABDOMINAL HYSTERECTOMY     Total  . Bladder lift  15 years ago,  Uterus  prolapse causing intestinal damage and surgery    . BREAST LUMPECTOMY WITH RADIOACTIVE SEED AND SENTINEL LYMPH NODE BIOPSY Left 02/18/2018   Procedure: LEFT BREAST LUMPECTOMY WITH RADIOACTIVE SEED AND LEFT AXILLARY DEEP SENTINEL LYMPH NODE BIOPSY, INJECT BLUE DYE LEFT BREAST;  Surgeon: Fanny Skates, MD;  Location: [facility];  Service: General;  Laterality: Left;  . PORTACATH PLACEMENT Right 02/18/2018   Procedure: INSERTION PORT-A-CATH;  Surgeon: Fanny Skates, MD;  Location: [facility];  Service: General;  Laterality: Right;  . Right torn meniscus repair x 2    . THYROID LOBECTOMY N/A 02/04/2015   Procedure: TOTAL THYROIDECTOMY;  Surgeon: Armandina Gemma, MD;  Location: [facility];  Service: General;  Laterality: N/A;  . TONSILLECTOMY    . TUMOR REMOVAL    . Tumors on arms removed,  Tumor on gum removed,  Tumor removed from inside ear      FAMILY HISTORY Family History  Problem Relation Age of Onset  . Ovarian cancer Sister   . Lung cancer Sister   . Throat cancer Brother   . Brain cancer Sister    She notes that her father died from old age at age 26. Patients' mother died from multiple comorbidities at age 75. The patient has 5 brothers and 6 sisters. Patients' sister had ovarian cancer at age 38 and lung cancer at 7, brother with throat cancer at age 47, and sister with brain cancer at age 76.    GYNECOLOGIC HISTORY:  No LMP recorded. Patient has had a hysterectomy. 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 Her husband was a Librarian, academic at [employer].  He now has significant medical issues and the patient is his caregiver her son, Margaret Hodges manages a call center for medical supplies. Her daughter, Margaret Hodges, is Patient Care  surgical coordinator at [employer] clinic.  The patient has 2 grandchildren. She attends [employer].    ADVANCED DIRECTIVES: Her [place] is her daughter, [name] and can be reached at [phone].     HEALTH MAINTENANCE: Social History   Tobacco Use  . Smoking status: Current Every Day Smoker    Packs/day: 1.50    Types: Cigarettes  . Smokeless tobacco: Never Used  Substance Use Topics  . Alcohol use: No  . Drug use: No     Colonoscopy: Yes through [facility]  PAP:   Bone density:    Allergies  Allergen Reactions  . Allegra [Fexofenadine] Hives  . Clindamycin/Lincomycin Hives and Diarrhea  . Influenza A (H1n1) Monoval Vac Other (See Comments)    Guillian Barre Syndrome  . Keflex [Cephalexin] Hives  . Penicillins Swelling    Has patient had a PCN reaction causing immediate rash, facial/tongue/throat swelling, SOB or lightheadedness with hypotension: No Has patient had a PCN reaction causing severe rash involving mucus membranes or skin necrosis: No Has patient had a PCN reaction that required hospitalization No Has patient had a PCN reaction occurring within the last 10 years: No If  all of the above answers are "NO", then may proceed with Cephalosporin use.   . Prednisone Other (See Comments)    "heart attack symptoms" but even had heart cath & had no cardiac disease  . Protonix [Pantoprazole Sodium] Diarrhea    Tolerates Prilosec  . Statins Other (See Comments)    myalgias    Current Outpatient Medications  Medication Sig Dispense Refill  . amLODipine (NORVASC) 5 MG tablet Take 1 tablet (5 mg total) by mouth daily. 30 tablet 0  . anastrozole (ARIMIDEX) 1 MG tablet Take 1 tablet (1 mg total) by mouth daily. 90 tablet 4  . Cholecalciferol (VITAMIN D) 2000 UNITS tablet Take 2,000 Units by mouth daily.    . clobetasol cream (TEMOVATE) 0.25 % Apply 1 application topically 2 (two) times daily. 30 g 0  . diclofenac sodium (VOLTAREN) 1 % GEL  Apply 4 g topically 4 (four) times daily as needed (arthritis pain).     . hyoscyamine (LEVBID) 0.375 MG 12 hr tablet Take 0.375 mg by mouth every 12 (twelve) hours as needed (blaader spasms).    Marland Kitchen levothyroxine (SYNTHROID, LEVOTHROID) 100 MCG tablet Take 100 mcg by mouth daily.    Marland Kitchen omeprazole (PRILOSEC) 40 MG capsule Take 1 capsule (40 mg total) by mouth daily. 60 capsule 4  . saccharomyces boulardii (FLORASTOR) 250 MG capsule Take 250 mg by mouth daily as needed (only when on antibiotic).     No current facility-administered medications for this visit.     OBJECTIVE: Middle-aged white woman in no acute distress  Vitals:   09/10/18 0828  BP: 119/62  Pulse: 66  Resp: 18  Temp: 98.2 F (36.8 C)  SpO2: 99%     Body mass index is 26.29 kg/m.   Wt Readings from Last 3 Encounters:  09/10/18 172 lb 14.4 oz (78.4 kg)  09/09/18 172 lb 3.2 oz (78.1 kg)  07/12/18 167 lb 8 oz (76 kg)      ECOG FS:1 - Symptomatic but completely ambulatory  Sclerae unicteric, pupils round and equal Wearing a mask No cervical or supraclavicular adenopathy Lungs no rales or rhonchi Heart regular rate and rhythm Abd soft, nontender, positive bowel sounds MSK no focal spinal tenderness, no upper extremity lymphedema Neuro: nonfocal, well oriented, appropriate affect Breasts: The right breast is benign.  The left breast is status post lumpectomy and radiation.  There is still some hyperpigmentation which is fading.  There is no evidence of local recurrence.  Both axillae are benign. Skin: The patient has confluent erythematous scaly patches in both her hands and the heel of the left foot  LAB RESULTS:  CMP     Component Value Date/Time   NA 141 09/10/2018 0811   K 4.4 09/10/2018 0811   CL 109 09/10/2018 0811   CO2 20 (L) 09/10/2018 0811   GLUCOSE 96 09/10/2018 0811   BUN 13 09/10/2018 0811   CREATININE 0.77 09/10/2018 0811   CREATININE 0.82 08/23/2018 0828   CALCIUM 9.2 09/10/2018 0811   PROT 7.2  09/10/2018 0811   ALBUMIN 3.8 09/10/2018 0811   AST 17 09/10/2018 0811   AST 15 08/23/2018 0828   ALT 29 09/10/2018 0811   ALT 21 08/23/2018 0828   ALKPHOS 99 09/10/2018 0811   BILITOT 0.4 09/10/2018 0811   BILITOT 0.3 08/23/2018 0828   GFRNONAA >60 09/10/2018 0811   GFRNONAA >60 08/23/2018 0828   GFRAA >60 09/10/2018 0811   GFRAA >60 08/23/2018 0828    Lab Results  Component  Value Date   TOTALPROTELP 7.5 05/08/2017    No results found for: Nils Pyle, [facility]  Lab Results  Component Value Date   WBC 8.2 09/10/2018   NEUTROABS 5.6 09/10/2018   HGB 13.3 09/10/2018   HCT 40.4 09/10/2018   MCV 99.5 09/10/2018   PLT 288 09/10/2018    '@LASTCHEMISTRY' @  No results found for: LABCA2  No components found for: ZYYQMG500  No results for input(s): INR in the last 168 hours.  No results found for: LABCA2  No results found for: BBC488  No results found for: QBV694  No results found for: HWT888  No results found for: CA2729  No components found for: HGQUANT  No results found for: CEA1 / No results found for: CEA1   No results found for: AFPTUMOR  No results found for: CHROMOGRNA  No results found for: PSA1  Appointment on 09/10/2018  Component Date Value Ref Range Status  . Sodium 09/10/2018 141  135 - 145 mmol/L Final  . Potassium 09/10/2018 4.4  3.5 - 5.1 mmol/L Final  . Chloride 09/10/2018 109  98 - 111 mmol/L Final  . CO2 09/10/2018 20* 22 - 32 mmol/L Final  . Glucose, Bld 09/10/2018 96  70 - 99 mg/dL Final  . BUN 09/10/2018 13  8 - 23 mg/dL Final  . Creatinine, Ser 09/10/2018 0.77  0.44 - 1.00 mg/dL Final  . Calcium 09/10/2018 9.2  8.9 - 10.3 mg/dL Final  . Total Protein 09/10/2018 7.2  6.5 - 8.1 g/dL Final  . Albumin 09/10/2018 3.8  3.5 - 5.0 g/dL Final  . AST 09/10/2018 17  15 - 41 U/L Final  . ALT 09/10/2018 29  0 - 44 U/L Final  . Alkaline Phosphatase 09/10/2018 99  38 - 126 U/L Final  . Total Bilirubin 09/10/2018 0.4  0.3 - 1.2  mg/dL Final  . GFR calc non Af Amer 09/10/2018 >60  >60 mL/min Final  . GFR calc Af Amer 09/10/2018 >60  >60 mL/min Final  . Anion gap 09/10/2018 12  5 - 15 Final   Performed at [facility], [place] [address] [place], [place] [address]  . WBC 09/10/2018 8.2  4.0 - 10.5 K/uL Final  . RBC 09/10/2018 4.06  3.87 - 5.11 MIL/uL Final  . Hemoglobin 09/10/2018 13.3  12.0 - 15.0 g/dL Final  . HCT 09/10/2018 40.4  36.0 - 46.0 % Final  . MCV 09/10/2018 99.5  80.0 - 100.0 fL Final  . MCH 09/10/2018 32.8  26.0 - 34.0 pg Final  . MCHC 09/10/2018 32.9  30.0 - 36.0 g/dL Final  . RDW 09/10/2018 14.0  11.5 - 15.5 % Final  . Platelets 09/10/2018 288  150 - 400 K/uL Final  . nRBC 09/10/2018 0.0  0.0 - 0.2 % Final  . Neutrophils Relative % 09/10/2018 68  % Final  . Neutro Abs 09/10/2018 5.6  1.7 - 7.7 K/uL Final  . Lymphocytes Relative 09/10/2018 19  % Final  . Lymphs Abs 09/10/2018 1.6  0.7 - 4.0 K/uL Final  . Monocytes Relative 09/10/2018 9  % Final  . Monocytes Absolute 09/10/2018 0.8  0.1 - 1.0 K/uL Final  . Eosinophils Relative 09/10/2018 3  % Final  . Eosinophils Absolute 09/10/2018 0.3  0.0 - 0.5 K/uL Final  . Basophils Relative 09/10/2018 1  % Final  . Basophils Absolute 09/10/2018 0.1  0.0 - 0.1 K/uL Final  . Immature Granulocytes 09/10/2018 0  % Final  . Abs Immature Granulocytes  09/10/2018 0.03  0.00 - 0.07 K/uL Final   Performed at [facility], [place] [address] [place], [place] [address]    (this displays the last labs from the last 3 days)  Lab Results  Component Value Date   TOTALPROTELP 7.5 05/08/2017   (this displays SPEP labs)  No results found for: KPAFRELGTCHN, LAMBDASER, KAPLAMBRATIO (kappa/lambda light chains)  No results found for: HGBA, HGBA2QUANT, HGBFQUANT, HGBSQUAN (Hemoglobinopathy evaluation)   Lab Results  Component Value Date   LDH 171 05/08/2017    No results found for: IRON, TIBC, IRONPCTSAT (Iron and TIBC)   No results found for: FERRITIN  Urinalysis    Component Value Date/Time   COLORURINE YELLOW 04/04/2014 2112   APPEARANCEUR CLEAR 04/04/2014 2112   LABSPEC 1.017 04/04/2014 2112   PHURINE 6.0 04/04/2014 2112   GLUCOSEU NEGATIVE 04/04/2014 2112   HGBUR NEGATIVE 04/04/2014 2112   BILIRUBINUR NEGATIVE 04/04/2014 2112   [place] NEGATIVE 04/04/2014 2112   PROTEINUR NEGATIVE 04/04/2014 2112   UROBILINOGEN 0.2 04/04/2014 2112   NITRITE NEGATIVE 04/04/2014 2112   LEUKOCYTESUR NEGATIVE 04/04/2014 2112     STUDIES:  No results found.    ELIGIBLE FOR AVAILABLE RESEARCH PROTOCOL: no   ASSESSMENT: 73 y.o. [place] woman status post left breast lower inner quadrant biopsy 01/16/2018 for a clinical T1c N0, stage IA invasive ductal carcinoma, grade 3, estrogen receptor weakly positive, progesterone receptor negative, with an MIB-1 of 80%, and HER-2 amplified  (1) genetics 03/06/2018 showed no deleterious mutations  (2) status post left lumpectomy and sentinel lymph node sampling 02/18/2018 for a pT2 pN0, stage IIA invasive ductal carcinoma, grade 3, with negative margins.  (a) a total of 5 lymph nodes were removed   (3) adjuvant chemo immunotherapy consisting of paclitaxel/trastuzumab weekly for 12 weeks, started 03/08/2018.  (a) paclitaxel discontinued after 6 doses because of peripheral neuropathy; last dose 04/12/2018  (4) trastuzumab continued to complete a year (through December 2020).  (a) echocardiogram 02/06/2018 shows an ejection fraction in the 60-65% range  (b) echocardiogram 06/06/2018 shows an ejection fraction in the 60-65% range  (c) echocardiogram 09/09/2018 shows an ejection fraction in the 60-65% range  (5) adjuvant radiation completed 06/26/2018  (6) antiestrogens to follow at the completion of local treatment.  (a) bone density at [facility] 12/27/2017 shows a T score of -1.7  (7) tobacco abuse disorder:  [place] stopped smoking 06/26/2018   PLAN: Berda  is tolerating the trastuzumab generally well and maintains an excellent ejection fraction.  I do not have a simple explanation why sometimes she feels better with the treatment than other times.  She does have a difficult situation at home with her husband being terminal and she having to do absolutely everything both for him and for herself and for the household  Unfortunately she has started smoking somewhat.  We discussed that today  She has developed some rash areas in her palms and soles which are consistent with psoriasis.  She tells me she did have similar rashes in the past and that she has been treated with creams.  Will add alclometasol to her medications and she will use it once or twice daily until it improves.  She read up on tamoxifen and decided she did not want to try it because of concerns regarding clots.  She understands when she took birth control pills previously she had the same risk of clotting.  Nevertheless we are going to give anastrozole a try and I asked her to just  take it once a week and see if that works for her.  If it does we will increase to daily.  If she has horrible hot flashes we will have to rethink antiestrogens  I have encouraged her to exercise on a regular basis  Dr. Cheron Schaumann has requested a TSH be sent and I have added that to her labs today.  We are continuing the trastuzumab through the first week in December.  She will see me in 9 weeks.  She knows to call for any other issue that may develop before then.  I spent approximately 30 minutes face to face with Izora Gala with more than 50% of that time spent in counseling and coordination of care.  Magrinat, Virgie Dad, MD  09/10/18 8:59 AM Medical Oncology and Hematology [facility] [address] [place], [place] [address] Tel. [phone]    Fax. [phone]   I, Wilburn Mylar, am acting as scribe for Dr. Virgie Dad. Magrinat.  I, Lurline Del MD, have reviewed the above  documentation for accuracy and completeness, and I agree with the above.

## 2018-09-09 NOTE — Progress Notes (Signed)
  Echocardiogram 2D Echocardiogram has been performed.  Margaret Hodges Margaret Hodges 09/09/2018, 11:11 AM

## 2018-09-10 ENCOUNTER — Inpatient Hospital Stay: Payer: Medicare Other

## 2018-09-10 ENCOUNTER — Other Ambulatory Visit: Payer: Self-pay

## 2018-09-10 ENCOUNTER — Inpatient Hospital Stay: Payer: Medicare Other | Attending: Oncology | Admitting: Oncology

## 2018-09-10 VITALS — BP 119/62 | HR 66 | Temp 98.2°F | Resp 18 | Ht 68.0 in | Wt 172.9 lb

## 2018-09-10 DIAGNOSIS — I10 Essential (primary) hypertension: Secondary | ICD-10-CM | POA: Insufficient documentation

## 2018-09-10 DIAGNOSIS — Z808 Family history of malignant neoplasm of other organs or systems: Secondary | ICD-10-CM | POA: Diagnosis not present

## 2018-09-10 DIAGNOSIS — C50312 Malignant neoplasm of lower-inner quadrant of left female breast: Secondary | ICD-10-CM | POA: Insufficient documentation

## 2018-09-10 DIAGNOSIS — Z801 Family history of malignant neoplasm of trachea, bronchus and lung: Secondary | ICD-10-CM | POA: Diagnosis not present

## 2018-09-10 DIAGNOSIS — Z95828 Presence of other vascular implants and grafts: Secondary | ICD-10-CM

## 2018-09-10 DIAGNOSIS — Z923 Personal history of irradiation: Secondary | ICD-10-CM | POA: Diagnosis not present

## 2018-09-10 DIAGNOSIS — Z90710 Acquired absence of both cervix and uterus: Secondary | ICD-10-CM | POA: Diagnosis not present

## 2018-09-10 DIAGNOSIS — F17210 Nicotine dependence, cigarettes, uncomplicated: Secondary | ICD-10-CM | POA: Insufficient documentation

## 2018-09-10 DIAGNOSIS — Z8041 Family history of malignant neoplasm of ovary: Secondary | ICD-10-CM | POA: Diagnosis not present

## 2018-09-10 DIAGNOSIS — Z5112 Encounter for antineoplastic immunotherapy: Secondary | ICD-10-CM | POA: Diagnosis not present

## 2018-09-10 DIAGNOSIS — E0590 Thyrotoxicosis, unspecified without thyrotoxic crisis or storm: Secondary | ICD-10-CM

## 2018-09-10 DIAGNOSIS — Z170 Estrogen receptor positive status [ER+]: Secondary | ICD-10-CM | POA: Diagnosis not present

## 2018-09-10 DIAGNOSIS — Z79811 Long term (current) use of aromatase inhibitors: Secondary | ICD-10-CM | POA: Diagnosis not present

## 2018-09-10 DIAGNOSIS — G629 Polyneuropathy, unspecified: Secondary | ICD-10-CM | POA: Insufficient documentation

## 2018-09-10 DIAGNOSIS — Z79899 Other long term (current) drug therapy: Secondary | ICD-10-CM

## 2018-09-10 DIAGNOSIS — Z720 Tobacco use: Secondary | ICD-10-CM

## 2018-09-10 LAB — CBC WITH DIFFERENTIAL/PLATELET
Abs Immature Granulocytes: 0.03 10*3/uL (ref 0.00–0.07)
Basophils Absolute: 0.1 10*3/uL (ref 0.0–0.1)
Basophils Relative: 1 %
Eosinophils Absolute: 0.3 10*3/uL (ref 0.0–0.5)
Eosinophils Relative: 3 %
HCT: 40.4 % (ref 36.0–46.0)
Hemoglobin: 13.3 g/dL (ref 12.0–15.0)
Immature Granulocytes: 0 %
Lymphocytes Relative: 19 %
Lymphs Abs: 1.6 10*3/uL (ref 0.7–4.0)
MCH: 32.8 pg (ref 26.0–34.0)
MCHC: 32.9 g/dL (ref 30.0–36.0)
MCV: 99.5 fL (ref 80.0–100.0)
Monocytes Absolute: 0.8 10*3/uL (ref 0.1–1.0)
Monocytes Relative: 9 %
Neutro Abs: 5.6 10*3/uL (ref 1.7–7.7)
Neutrophils Relative %: 68 %
Platelets: 288 10*3/uL (ref 150–400)
RBC: 4.06 MIL/uL (ref 3.87–5.11)
RDW: 14 % (ref 11.5–15.5)
WBC: 8.2 10*3/uL (ref 4.0–10.5)
nRBC: 0 % (ref 0.0–0.2)

## 2018-09-10 LAB — COMPREHENSIVE METABOLIC PANEL
ALT: 29 U/L (ref 0–44)
AST: 17 U/L (ref 15–41)
Albumin: 3.8 g/dL (ref 3.5–5.0)
Alkaline Phosphatase: 99 U/L (ref 38–126)
Anion gap: 12 (ref 5–15)
BUN: 13 mg/dL (ref 8–23)
CO2: 20 mmol/L — ABNORMAL LOW (ref 22–32)
Calcium: 9.2 mg/dL (ref 8.9–10.3)
Chloride: 109 mmol/L (ref 98–111)
Creatinine, Ser: 0.77 mg/dL (ref 0.44–1.00)
GFR calc Af Amer: >60 mL/min (ref >60)
GFR calc non Af Amer: >60 mL/min (ref >60)
Glucose, Bld: 96 mg/dL (ref 70–99)
Potassium: 4.4 mmol/L (ref 3.5–5.1)
Sodium: 141 mmol/L (ref 135–145)
Total Bilirubin: 0.4 mg/dL (ref 0.3–1.2)
Total Protein: 7.2 g/dL (ref 6.5–8.1)

## 2018-09-10 MED ORDER — CLOBETASOL PROPIONATE 0.05 % EX CREA: 1.0000 "application " | TOPICAL_CREAM | Freq: Two times a day (BID) | CUTANEOUS | 0 refills | Status: DC

## 2018-09-10 MED ORDER — DIPHENHYDRAMINE HCL 25 MG PO CAPS
ORAL_CAPSULE | ORAL | Status: AC
  Filled 2018-09-10: qty 1

## 2018-09-10 MED ORDER — ANASTROZOLE 1 MG PO TABS: 1.0000 mg | ORAL_TABLET | Freq: Every day | ORAL | 4 refills | Status: DC

## 2018-09-10 MED ORDER — HEPARIN SOD (PORK) LOCK FLUSH 100 UNIT/ML IV SOLN
500.0000 [IU] | Freq: Once | INTRAVENOUS | Status: AC | PRN
  Administered 2018-09-10: 500 [IU]
  Filled 2018-09-10: qty 5

## 2018-09-10 MED ORDER — SODIUM CHLORIDE 0.9 % IV SOLN
Freq: Once | INTRAVENOUS | Status: AC
  Administered 2018-09-10: 10:00:00 via INTRAVENOUS
  Filled 2018-09-10: qty 250

## 2018-09-10 MED ORDER — ACETAMINOPHEN 325 MG PO TABS
650.0000 mg | ORAL_TABLET | Freq: Once | ORAL | Status: AC
  Administered 2018-09-10: 10:00:00 650 mg via ORAL

## 2018-09-10 MED ORDER — DIPHENHYDRAMINE HCL 25 MG PO CAPS
25.0000 mg | ORAL_CAPSULE | Freq: Once | ORAL | Status: AC
  Administered 2018-09-10: 10:00:00 25 mg via ORAL

## 2018-09-10 MED ORDER — TRASTUZUMAB CHEMO 150 MG IV SOLR
450.0000 mg | Freq: Once | INTRAVENOUS | Status: AC
  Administered 2018-09-10: 450 mg via INTRAVENOUS
  Filled 2018-09-10: qty 21.43

## 2018-09-10 MED ORDER — SODIUM CHLORIDE 0.9% FLUSH
10.0000 mL | INTRAVENOUS | Status: DC | PRN
  Administered 2018-09-10: 10 mL
  Filled 2018-09-10: qty 10

## 2018-09-10 MED ORDER — ACETAMINOPHEN 325 MG PO TABS
ORAL_TABLET | ORAL | Status: AC
  Filled 2018-09-10: qty 2

## 2018-09-10 NOTE — Patient Instructions (Signed)
[facility] Discharge Instructions for Patients Receiving Chemotherapy  Today you received the following chemotherapy agents Herceptin  To help prevent nausea and vomiting after your treatment, we encourage you to take your nausea medication as directed   If you develop nausea and vomiting that is not controlled by your nausea medication, call the clinic.   BELOW ARE SYMPTOMS THAT SHOULD BE REPORTED IMMEDIATELY:  *FEVER GREATER THAN 100.5 F  *CHILLS WITH OR WITHOUT FEVER  NAUSEA AND VOMITING THAT IS NOT CONTROLLED WITH YOUR NAUSEA MEDICATION  *UNUSUAL SHORTNESS OF BREATH  *UNUSUAL BRUISING OR BLEEDING  TENDERNESS IN MOUTH AND THROAT WITH OR WITHOUT PRESENCE OF ULCERS  *URINARY PROBLEMS  *BOWEL PROBLEMS  UNUSUAL RASH Items with * indicate a potential emergency and should be followed up as soon as possible.  Feel free to call the clinic should you have any questions or concerns. The clinic phone number is [phone].  Please show the CHEMO ALERT CARD at check-in to the Emergency Department and triage nurse.   

## 2018-09-11 LAB — THYROID PANEL WITH TSH
Free Thyroxine Index: 2.6 (ref 1.2–4.9)
T3 Uptake Ratio: 28 % (ref 24–39)
T4, Total: 9.2 ug/dL (ref 4.5–12.0)
TSH: 1.56 u[IU]/mL (ref 0.450–4.500)

## 2018-10-01 ENCOUNTER — Other Ambulatory Visit: Payer: Medicare Other

## 2018-10-01 ENCOUNTER — Inpatient Hospital Stay: Payer: Medicare Other

## 2018-10-01 ENCOUNTER — Inpatient Hospital Stay: Payer: Medicare Other | Attending: Oncology

## 2018-10-01 ENCOUNTER — Ambulatory Visit: Payer: Medicare Other

## 2018-10-01 ENCOUNTER — Other Ambulatory Visit: Payer: Self-pay | Admitting: Oncology

## 2018-10-01 ENCOUNTER — Encounter: Payer: Self-pay | Admitting: *Deleted

## 2018-10-01 DIAGNOSIS — Z170 Estrogen receptor positive status [ER+]: Secondary | ICD-10-CM | POA: Insufficient documentation

## 2018-10-01 DIAGNOSIS — Z5112 Encounter for antineoplastic immunotherapy: Secondary | ICD-10-CM | POA: Insufficient documentation

## 2018-10-01 DIAGNOSIS — C50312 Malignant neoplasm of lower-inner quadrant of left female breast: Secondary | ICD-10-CM | POA: Insufficient documentation

## 2018-10-04 ENCOUNTER — Inpatient Hospital Stay: Payer: Medicare Other

## 2018-10-04 ENCOUNTER — Other Ambulatory Visit: Payer: Self-pay

## 2018-10-04 VITALS — BP 131/73 | HR 79 | Temp 98.5°F | Resp 16 | Wt 171.8 lb

## 2018-10-04 DIAGNOSIS — Z95828 Presence of other vascular implants and grafts: Secondary | ICD-10-CM

## 2018-10-04 DIAGNOSIS — Z5112 Encounter for antineoplastic immunotherapy: Secondary | ICD-10-CM | POA: Diagnosis not present

## 2018-10-04 DIAGNOSIS — C50312 Malignant neoplasm of lower-inner quadrant of left female breast: Secondary | ICD-10-CM

## 2018-10-04 DIAGNOSIS — Z170 Estrogen receptor positive status [ER+]: Secondary | ICD-10-CM | POA: Diagnosis not present

## 2018-10-04 DIAGNOSIS — Z720 Tobacco use: Secondary | ICD-10-CM

## 2018-10-04 LAB — CBC WITH DIFFERENTIAL/PLATELET
Abs Immature Granulocytes: 0.05 10*3/uL (ref 0.00–0.07)
Basophils Absolute: 0.1 10*3/uL (ref 0.0–0.1)
Basophils Relative: 1 %
Eosinophils Absolute: 0.3 10*3/uL (ref 0.0–0.5)
Eosinophils Relative: 3 %
HCT: 40 % (ref 36.0–46.0)
Hemoglobin: 13.3 g/dL (ref 12.0–15.0)
Immature Granulocytes: 1 %
Lymphocytes Relative: 16 %
Lymphs Abs: 1.7 10*3/uL (ref 0.7–4.0)
MCH: 33 pg (ref 26.0–34.0)
MCHC: 33.3 g/dL (ref 30.0–36.0)
MCV: 99.3 fL (ref 80.0–100.0)
Monocytes Absolute: 0.9 10*3/uL (ref 0.1–1.0)
Monocytes Relative: 9 %
Neutro Abs: 7.6 10*3/uL (ref 1.7–7.7)
Neutrophils Relative %: 70 %
Platelets: 294 10*3/uL (ref 150–400)
RBC: 4.03 MIL/uL (ref 3.87–5.11)
RDW: 13.8 % (ref 11.5–15.5)
WBC: 10.7 10*3/uL — ABNORMAL HIGH (ref 4.0–10.5)
nRBC: 0 % (ref 0.0–0.2)

## 2018-10-04 LAB — COMPREHENSIVE METABOLIC PANEL
ALT: 22 U/L (ref 0–44)
AST: 17 U/L (ref 15–41)
Albumin: 3.7 g/dL (ref 3.5–5.0)
Alkaline Phosphatase: 105 U/L (ref 38–126)
Anion gap: 10 (ref 5–15)
BUN: 13 mg/dL (ref 8–23)
CO2: 22 mmol/L (ref 22–32)
Calcium: 9.2 mg/dL (ref 8.9–10.3)
Chloride: 108 mmol/L (ref 98–111)
Creatinine, Ser: 0.77 mg/dL (ref 0.44–1.00)
GFR calc Af Amer: >60 mL/min (ref >60)
GFR calc non Af Amer: >60 mL/min (ref >60)
Glucose, Bld: 100 mg/dL — ABNORMAL HIGH (ref 70–99)
Potassium: 4.3 mmol/L (ref 3.5–5.1)
Sodium: 140 mmol/L (ref 135–145)
Total Bilirubin: 0.4 mg/dL (ref 0.3–1.2)
Total Protein: 7.4 g/dL (ref 6.5–8.1)

## 2018-10-04 MED ORDER — HEPARIN SOD (PORK) LOCK FLUSH 100 UNIT/ML IV SOLN
500.0000 [IU] | Freq: Once | INTRAVENOUS | Status: AC | PRN
  Administered 2018-10-04: 500 [IU]
  Filled 2018-10-04: qty 5

## 2018-10-04 MED ORDER — DIPHENHYDRAMINE HCL 25 MG PO CAPS
25.0000 mg | ORAL_CAPSULE | Freq: Once | ORAL | Status: AC
  Administered 2018-10-04: 25 mg via ORAL

## 2018-10-04 MED ORDER — DIPHENHYDRAMINE HCL 25 MG PO CAPS
ORAL_CAPSULE | ORAL | Status: AC
  Filled 2018-10-04: qty 1

## 2018-10-04 MED ORDER — ACETAMINOPHEN 325 MG PO TABS
ORAL_TABLET | ORAL | Status: AC
  Filled 2018-10-04: qty 2

## 2018-10-04 MED ORDER — ACETAMINOPHEN 325 MG PO TABS
650.0000 mg | ORAL_TABLET | Freq: Once | ORAL | Status: AC
  Administered 2018-10-04: 650 mg via ORAL

## 2018-10-04 MED ORDER — TRASTUZUMAB CHEMO 150 MG IV SOLR
450.0000 mg | Freq: Once | INTRAVENOUS | Status: AC
  Administered 2018-10-04: 450 mg via INTRAVENOUS
  Filled 2018-10-04: qty 21.43

## 2018-10-04 MED ORDER — SODIUM CHLORIDE 0.9% FLUSH
10.0000 mL | INTRAVENOUS | Status: DC | PRN
  Administered 2018-10-04: 10 mL
  Filled 2018-10-04: qty 10

## 2018-10-04 MED ORDER — SODIUM CHLORIDE 0.9 % IV SOLN
Freq: Once | INTRAVENOUS | Status: AC
  Administered 2018-10-04: 09:00:00 via INTRAVENOUS
  Filled 2018-10-04: qty 250

## 2018-10-04 MED ORDER — SODIUM CHLORIDE 0.9% FLUSH
10.0000 mL | INTRAVENOUS | Status: DC | PRN
  Administered 2018-10-04: 08:00:00 10 mL
  Filled 2018-10-04: qty 10

## 2018-10-04 NOTE — Patient Instructions (Signed)
[facility] Discharge Instructions for Patients Receiving Chemotherapy  Today you received the following chemotherapy agents Herceptin  To help prevent nausea and vomiting after your treatment, we encourage you to take your nausea medication as directed   If you develop nausea and vomiting that is not controlled by your nausea medication, call the clinic.   BELOW ARE SYMPTOMS THAT SHOULD BE REPORTED IMMEDIATELY:  *FEVER GREATER THAN 100.5 F  *CHILLS WITH OR WITHOUT FEVER  NAUSEA AND VOMITING THAT IS NOT CONTROLLED WITH YOUR NAUSEA MEDICATION  *UNUSUAL SHORTNESS OF BREATH  *UNUSUAL BRUISING OR BLEEDING  TENDERNESS IN MOUTH AND THROAT WITH OR WITHOUT PRESENCE OF ULCERS  *URINARY PROBLEMS  *BOWEL PROBLEMS  UNUSUAL RASH Items with * indicate a potential emergency and should be followed up as soon as possible.  Feel free to call the clinic should you have any questions or concerns. The clinic phone number is [phone].  Please show the CHEMO ALERT CARD at check-in to the Emergency Department and triage nurse.   

## 2018-10-22 ENCOUNTER — Other Ambulatory Visit: Payer: Medicare Other

## 2018-10-22 ENCOUNTER — Ambulatory Visit: Payer: Medicare Other

## 2018-10-23 NOTE — Progress Notes (Signed)
The following biosimilar Ogivri (trastuzumab-dkst) has been selected for use in this patient.  Kennith Center, Pharm.D., CPP 10/23/2018@5 :14 PM

## 2018-10-25 ENCOUNTER — Inpatient Hospital Stay: Payer: Medicare Other

## 2018-10-25 ENCOUNTER — Other Ambulatory Visit: Payer: Self-pay

## 2018-10-25 VITALS — BP 118/81 | HR 86 | Temp 98.7°F | Resp 18 | Wt 170.5 lb

## 2018-10-25 DIAGNOSIS — C50312 Malignant neoplasm of lower-inner quadrant of left female breast: Secondary | ICD-10-CM

## 2018-10-25 DIAGNOSIS — Z5112 Encounter for antineoplastic immunotherapy: Secondary | ICD-10-CM | POA: Diagnosis not present

## 2018-10-25 DIAGNOSIS — Z170 Estrogen receptor positive status [ER+]: Secondary | ICD-10-CM | POA: Diagnosis not present

## 2018-10-25 DIAGNOSIS — Z95828 Presence of other vascular implants and grafts: Secondary | ICD-10-CM

## 2018-10-25 DIAGNOSIS — Z720 Tobacco use: Secondary | ICD-10-CM

## 2018-10-25 LAB — COMPREHENSIVE METABOLIC PANEL
ALT: 22 U/L (ref 0–44)
AST: 18 U/L (ref 15–41)
Albumin: 4 g/dL (ref 3.5–5.0)
Alkaline Phosphatase: 104 U/L (ref 38–126)
Anion gap: 8 (ref 5–15)
BUN: 14 mg/dL (ref 8–23)
CO2: 24 mmol/L (ref 22–32)
Calcium: 9.8 mg/dL (ref 8.9–10.3)
Chloride: 107 mmol/L (ref 98–111)
Creatinine, Ser: 0.85 mg/dL (ref 0.44–1.00)
GFR calc Af Amer: >60 mL/min (ref >60)
GFR calc non Af Amer: >60 mL/min (ref >60)
Glucose, Bld: 104 mg/dL — ABNORMAL HIGH (ref 70–99)
Potassium: 4.6 mmol/L (ref 3.5–5.1)
Sodium: 139 mmol/L (ref 135–145)
Total Bilirubin: 0.4 mg/dL (ref 0.3–1.2)
Total Protein: 7.3 g/dL (ref 6.5–8.1)

## 2018-10-25 LAB — CBC WITH DIFFERENTIAL/PLATELET
Abs Immature Granulocytes: 0.03 10*3/uL (ref 0.00–0.07)
Basophils Absolute: 0.1 10*3/uL (ref 0.0–0.1)
Basophils Relative: 1 %
Eosinophils Absolute: 0.3 10*3/uL (ref 0.0–0.5)
Eosinophils Relative: 2 %
HCT: 41.4 % (ref 36.0–46.0)
Hemoglobin: 13.8 g/dL (ref 12.0–15.0)
Immature Granulocytes: 0 %
Lymphocytes Relative: 18 %
Lymphs Abs: 1.9 10*3/uL (ref 0.7–4.0)
MCH: 33.1 pg (ref 26.0–34.0)
MCHC: 33.3 g/dL (ref 30.0–36.0)
MCV: 99.3 fL (ref 80.0–100.0)
Monocytes Absolute: 0.9 10*3/uL (ref 0.1–1.0)
Monocytes Relative: 9 %
Neutro Abs: 7.6 10*3/uL (ref 1.7–7.7)
Neutrophils Relative %: 70 %
Platelets: 311 10*3/uL (ref 150–400)
RBC: 4.17 MIL/uL (ref 3.87–5.11)
RDW: 13.2 % (ref 11.5–15.5)
WBC: 10.7 10*3/uL — ABNORMAL HIGH (ref 4.0–10.5)
nRBC: 0 % (ref 0.0–0.2)

## 2018-10-25 MED ORDER — DIPHENHYDRAMINE HCL 25 MG PO CAPS
25.0000 mg | ORAL_CAPSULE | Freq: Once | ORAL | Status: AC
  Administered 2018-10-25: 09:00:00 25 mg via ORAL

## 2018-10-25 MED ORDER — SODIUM CHLORIDE 0.9 % IV SOLN
Freq: Once | INTRAVENOUS | Status: AC
  Administered 2018-10-25: 09:00:00 via INTRAVENOUS
  Filled 2018-10-25: qty 250

## 2018-10-25 MED ORDER — SODIUM CHLORIDE 0.9% FLUSH
10.0000 mL | INTRAVENOUS | Status: DC | PRN
  Administered 2018-10-25: 08:00:00 10 mL
  Filled 2018-10-25: qty 10

## 2018-10-25 MED ORDER — DIPHENHYDRAMINE HCL 25 MG PO CAPS
ORAL_CAPSULE | ORAL | Status: AC
  Filled 2018-10-25: qty 1

## 2018-10-25 MED ORDER — TRASTUZUMAB CHEMO 150 MG IV SOLR
450.0000 mg | Freq: Once | INTRAVENOUS | Status: AC
  Administered 2018-10-25: 450 mg via INTRAVENOUS
  Filled 2018-10-25: qty 21.43

## 2018-10-25 MED ORDER — SODIUM CHLORIDE 0.9% FLUSH
10.0000 mL | INTRAVENOUS | Status: DC | PRN
  Administered 2018-10-25: 10:00:00 10 mL
  Filled 2018-10-25: qty 10

## 2018-10-25 MED ORDER — ACETAMINOPHEN 325 MG PO TABS
ORAL_TABLET | ORAL | Status: AC
  Filled 2018-10-25: qty 2

## 2018-10-25 MED ORDER — HEPARIN SOD (PORK) LOCK FLUSH 100 UNIT/ML IV SOLN
500.0000 [IU] | Freq: Once | INTRAVENOUS | Status: AC | PRN
  Administered 2018-10-25: 10:00:00 500 [IU]
  Filled 2018-10-25: qty 5

## 2018-10-25 MED ORDER — ACETAMINOPHEN 325 MG PO TABS
650.0000 mg | ORAL_TABLET | Freq: Once | ORAL | Status: AC
  Administered 2018-10-25: 650 mg via ORAL

## 2018-10-25 NOTE — Patient Instructions (Signed)
[facility] Discharge Instructions for Patients Receiving Chemotherapy  Today you received the following chemotherapy agents Trastuzumab (HERCEPTIN).  To help prevent nausea and vomiting after your treatment, we encourage you to take your nausea medication as prescribed.  If you develop nausea and vomiting that is not controlled by your nausea medication, call the clinic.   BELOW ARE SYMPTOMS THAT SHOULD BE REPORTED IMMEDIATELY:  *FEVER GREATER THAN 100.5 F  *CHILLS WITH OR WITHOUT FEVER  NAUSEA AND VOMITING THAT IS NOT CONTROLLED WITH YOUR NAUSEA MEDICATION  *UNUSUAL SHORTNESS OF BREATH  *UNUSUAL BRUISING OR BLEEDING  TENDERNESS IN MOUTH AND THROAT WITH OR WITHOUT PRESENCE OF ULCERS  *URINARY PROBLEMS  *BOWEL PROBLEMS  UNUSUAL RASH Items with * indicate a potential emergency and should be followed up as soon as possible.  Feel free to call the clinic should you have any questions or concerns. The clinic phone number is [phone].  Please show the CHEMO ALERT CARD at check-in to the Emergency Department and triage nurse.  Coronavirus (COVID-19) Are you at risk?  Are you at risk for the Coronavirus (COVID-19)?  To be considered HIGH RISK for Coronavirus (COVID-19), you have to meet the following criteria:  . Traveled to [place], [place], [place], [place] or [place]; or in the [place] to [place], [place], [place], or [place]; and have fever, cough, and shortness of breath within the last 2 weeks of travel OR . Been in close contact with a person diagnosed with COVID-19 within the last 2 weeks and have fever, cough, and shortness of breath . IF YOU DO NOT MEET THESE CRITERIA, YOU ARE CONSIDERED LOW RISK FOR COVID-19.  What to do if you are HIGH RISK for COVID-19?  . If you are having a medical emergency, call 911. . Seek medical care right away. Before you go to a doctor's office, urgent care or emergency department, call ahead and tell them  about your recent travel, contact with someone diagnosed with COVID-19, and your symptoms. You should receive instructions from your physician's office regarding next steps of care.  . When you arrive at healthcare provider, tell the healthcare staff immediately you have returned from visiting [place], [place], [place], [place] or [place]; or traveled in the [place] to [place], [place], [place], or [place]; in the last two weeks or you have been in close contact with a person diagnosed with COVID-19 in the last 2 weeks.   . Tell the health care staff about your symptoms: fever, cough and shortness of breath. . After you have been seen by a medical provider, you will be either: o Tested for (COVID-19) and discharged home on quarantine except to seek medical care if symptoms worsen, and asked to  - Stay home and avoid contact with others until you get your results (4-5 days)  - Avoid travel on public transportation if possible (such as bus, train, or airplane) or o Sent to the Emergency Department by EMS for evaluation, COVID-19 testing, and possible admission depending on your condition and test results.  What to do if you are LOW RISK for COVID-19?  Reduce your risk of any infection by using the same precautions used for avoiding the common cold or flu:  . Wash your hands often with soap and warm water for at least 20 seconds.  If soap and water are not readily available, use an alcohol-based hand sanitizer with at least 60% alcohol.  . If coughing or sneezing,   cover your mouth and nose by coughing or sneezing into the elbow areas of your shirt or coat, into a tissue or into your sleeve (not your hands). . Avoid shaking hands with others and consider head nods or verbal greetings only. . Avoid touching your eyes, nose, or mouth with unwashed hands.  . Avoid close contact with people who are sick. . Avoid places or events with large numbers of people in one location, like concerts or  sporting events. . Carefully consider travel plans you have or are making. . If you are planning any travel outside or inside the [place], visit the CDC's Travelers' Health webpage for the latest health notices. . If you have some symptoms but not all symptoms, continue to monitor at home and seek medical attention if your symptoms worsen. . If you are having a medical emergency, call 911.   ADDITIONAL HEALTHCARE OPTIONS FOR PATIENTS  [facility] / e-Visit: [email or web address]         [facility]: [phone]  [facility]: [phone]                   [facility]: [phone]   

## 2018-10-28 ENCOUNTER — Telehealth: Payer: Self-pay | Admitting: Oncology

## 2018-10-28 NOTE — Telephone Encounter (Signed)
Called per 8/03 sch message - husband said he will pt know we called about rescheduling appts . Pt was not home.

## 2018-10-28 NOTE — Telephone Encounter (Signed)
Pt called in and rescheduled appt to Thursday or Friday - pt aware of new dates and times.

## 2018-11-10 NOTE — Progress Notes (Signed)
[place]  [phone] [phone]     ID: Margaret Hodges DOB: 07-Oct-1945  MR#: [id number]  MHD#:[id number]  Patient Care Team: Margaret Jordan, MD as PCP - General (Family Medicine) Margaret Skates, MD as Consulting Physician (General Surgery) Margaret Hodges, Margaret Dad, MD as Consulting Physician (Oncology) Margaret Rudd, MD as Consulting Physician (Radiation Oncology) Margaret Kenner, MD (Dermatology) Margaret Dresser, MD as Consulting Physician (Cardiology) Margaret Hodges, Margaret Pascal, MD as Consulting Physician (Cardiology) OTHER MD:   CHIEF COMPLAINT: Estrogen receptor positive breast cancer  CURRENT TREATMENT: trastuzumab; anastrozole   INTERVAL HISTORY: Margaret Hodges returns today for follow-up and treatment of her estrogen receptor positive breast cancer.    She continues on trastuzumab.  She tolerates this well although she does have a little bit of loose bowel movements and a little bit of nausea on day 4.  Margaret Hodges's last echocardiogram on 09/09/2018 showed an ejection fraction in the 60% - 65% range, which is stable from 05/2018.  She already has an echocardiogram scheduled for 12/16/2018  At her last visit on 09/10/2018, she was started on anastrozole to be taken once a week.  She reports no problems from this medication, particularly no issues with hot flashes or vaginal dryness and no arthralgias or myalgias.  She obtains it at a good price.  However she only takes it about 3 times a week she says.  She just cannot remember to take it more than that  Her most recent mammogram was 01/03/2018 at [facility].  REVIEW OF SYSTEMS: Margaret Hodges tells me that her husband is terminal because of Alzheimer's and supranuclear palsy issues.  Her children are helping care for him but she really needs to have her treatments changed to Fridays and I will operational lysed that.  She is exercising by taking walks in the morning of course she will do some gardening and housework in addition to  taking care of her husband.  A detailed review of systems today was otherwise stable   HISTORY OF CURRENT ILLNESS: From the original intake note:  Margaret Hodges had routine screening mammography on 01/03/2018 showing a possible abnormality in the left breast. She underwent bilateral diagnostic mammography with tomography and left breast ultrasonography at [facility] on 01/15/2018 showing: Breast density category B; a 1.3 cm round solid mass with an indistinct margin in the left breast at 7 o'clock posterior 8 cm from the nipple. No significant abnormalities found in the left axilla.  Accordingly on 01/16/2018 she proceeded to biopsy of the left breast area in question. The pathology from this procedure showed [phone]): Invasive ductal carcinoma, grade III. Prognostic indicators significant for: estrogen receptor, 60% positive, with weak staining intensity; progesterone receptor negative, Proliferation marker Ki67 at 80%. HER2 positive by immunohistochemistry, 3+.  The patient's subsequent history is as detailed below.   PAST MEDICAL HISTORY: Past Medical History:  Diagnosis Date  . Anginal pain ([place])    with admission to hospital due to high blood pressure  . Complication of anesthesia   . Family history of ovarian cancer   . GERD (gastroesophageal reflux disease)   . Guillain Barr syndrome ([place]) 1959  . Guillain Barr syndrome ([place]) 1959  . Guillain Barr syndrome ([place]) 1959  . Headache    sinus headaches  . History of hiatal hernia    was told pt. had one, but no testing  . Hypertensive urgency 04/05/2014  . Malignant neoplasm of lower-inner quadrant of left breast in female, estrogen receptor positive ([place]) 01/24/2018  .  Neuromuscular disorder ([place])    Guillian- Barre Syndrome  . Pneumonia    hx. of walking pneumonia  . PONV (postoperative nausea and vomiting)     PAST SURGICAL HISTORY: Past Surgical History:  Procedure Laterality Date  . ABDOMINAL HYSTERECTOMY     Total   . Bladder lift  15 years ago,  Uterus prolapse causing intestinal damage and surgery    . BREAST LUMPECTOMY WITH RADIOACTIVE SEED AND SENTINEL LYMPH NODE BIOPSY Left 02/18/2018   Procedure: LEFT BREAST LUMPECTOMY WITH RADIOACTIVE SEED AND LEFT AXILLARY DEEP SENTINEL LYMPH NODE BIOPSY, INJECT BLUE DYE LEFT BREAST;  Surgeon: Margaret Skates, MD;  Location: [facility];  Service: General;  Laterality: Left;  . PORTACATH PLACEMENT Right 02/18/2018   Procedure: INSERTION PORT-A-CATH;  Surgeon: Margaret Skates, MD;  Location: [facility];  Service: General;  Laterality: Right;  . Right torn meniscus repair x 2    . THYROID LOBECTOMY N/A 02/04/2015   Procedure: TOTAL THYROIDECTOMY;  Surgeon: Armandina Gemma, MD;  Location: [facility];  Service: General;  Laterality: N/A;  . TONSILLECTOMY    . TUMOR REMOVAL    . Tumors on arms removed,  Tumor on gum removed,  Tumor removed from inside ear      FAMILY HISTORY Family History  Problem Relation Age of Onset  . Ovarian cancer Sister   . Lung cancer Sister   . Throat cancer Brother   . Brain cancer Sister    She notes that her father died from old age at age 36. Patients' mother died from multiple comorbidities at age 25. The patient has 5 brothers and 6 sisters. Patients' sister had ovarian cancer at age 82 and lung cancer at 61, brother with throat cancer at age 35, and sister with brain cancer at age 34.    GYNECOLOGIC HISTORY:  No LMP recorded. Patient has had a hysterectomy. 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 Her husband was a Librarian, academic at [employer].  He now has significant medical issues and the patient is his caregiver her son, Margaret Hodges manages a call center for medical supplies. Her  daughter, Margaret Hodges, is Patient Care surgical coordinator at [employer] clinic.  The patient has 2 grandchildren. She attends [employer].    ADVANCED DIRECTIVES: Her [place] is her daughter, [name] and can be reached at [phone].     HEALTH MAINTENANCE: Social History   Tobacco Use  . Smoking status: Current Every Day Smoker    Packs/day: 1.50    Types: Cigarettes  . Smokeless tobacco: Never Used  Substance Use Topics  . Alcohol use: No  . Drug use: No     Colonoscopy: Yes through [facility]  PAP:   Bone density:    Allergies  Allergen Reactions  . Allegra [Fexofenadine] Hives  . Clindamycin/Lincomycin Hives and Diarrhea  . Influenza A (H1n1) [place] Other (See Comments)    Guillian Barre Syndrome  . Keflex [Cephalexin] Hives  . Penicillins Swelling    Has patient had a PCN reaction causing immediate rash, facial/tongue/throat swelling, SOB or lightheadedness with hypotension: No Has patient had a PCN reaction causing severe rash involving mucus membranes or skin necrosis: No Has patient had a PCN reaction that required hospitalization  No Has patient had a PCN reaction occurring within the last 10 years: No If all of the above answers are "NO", then may proceed with Cephalosporin use.   . Prednisone Other (See Comments)    "heart attack symptoms" but even had heart cath & had no cardiac disease  . Protonix [Pantoprazole Sodium] Diarrhea    Tolerates Prilosec  . Statins Other (See Comments)    myalgias    Current Outpatient Medications  Medication Sig Dispense Refill  . amLODipine (NORVASC) 5 MG tablet Take 1 tablet (5 mg total) by mouth daily. 30 tablet 0  . anastrozole (ARIMIDEX) 1 MG tablet Take 1 tablet (1 mg total) by mouth daily. 90 tablet 4  . Cholecalciferol (VITAMIN D) 2000 UNITS tablet Take 2,000 Units by mouth daily.    . clobetasol cream (TEMOVATE) 5.37 % Apply 1 application topically 2 (two) times daily. 30 g  0  . diclofenac sodium (VOLTAREN) 1 % GEL Apply 4 g topically 4 (four) times daily as needed (arthritis pain).     . hyoscyamine (LEVBID) 0.375 MG 12 hr tablet Take 0.375 mg by mouth every 12 (twelve) hours as needed (blaader spasms).    Marland Kitchen levothyroxine (SYNTHROID, LEVOTHROID) 100 MCG tablet Take 100 mcg by mouth daily.    Marland Kitchen omeprazole (PRILOSEC) 40 MG capsule Take 1 capsule (40 mg total) by mouth daily. 60 capsule 4  . saccharomyces boulardii (FLORASTOR) 250 MG capsule Take 250 mg by mouth daily as needed (only when on antibiotic).     No current facility-administered medications for this visit.     OBJECTIVE: Middle-aged white woman who appears stated age There were no vitals filed for this visit.   There is no height or weight on file to calculate BMI.   Wt Readings from Last 3 Encounters:  10/25/18 170 lb 8 oz (77.3 kg)  10/04/18 171 lb 12.8 oz (77.9 kg)  09/10/18 172 lb 14.4 oz (78.4 kg)      ECOG FS:1 - Symptomatic but completely ambulatory  Sclerae unicteric, EOMs intact Wearing a mask No cervical or supraclavicular adenopathy Lungs no rales or rhonchi Heart regular rate and rhythm Abd soft, nontender, positive bowel sounds MSK no focal spinal tenderness, no upper extremity lymphedema Neuro: nonfocal, well oriented, appropriate affect Breasts: The right breast is unremarkable.  The left breast has undergone lumpectomy and radiation.  There is no evidence of local recurrence.  Both axillae are benign.   LAB RESULTS:  CMP     Component Value Date/Time   NA 139 10/25/2018 0820   K 4.6 10/25/2018 0820   CL 107 10/25/2018 0820   CO2 24 10/25/2018 0820   GLUCOSE 104 (H) 10/25/2018 0820   BUN 14 10/25/2018 0820   CREATININE 0.85 10/25/2018 0820   CREATININE 0.82 08/23/2018 0828   CALCIUM 9.8 10/25/2018 0820   PROT 7.3 10/25/2018 0820   ALBUMIN 4.0 10/25/2018 0820   AST 18 10/25/2018 0820   AST 15 08/23/2018 0828   ALT 22 10/25/2018 0820   ALT 21 08/23/2018 0828    ALKPHOS 104 10/25/2018 0820   BILITOT 0.4 10/25/2018 0820   BILITOT 0.3 08/23/2018 0828   GFRNONAA >60 10/25/2018 0820   GFRNONAA >60 08/23/2018 0828   GFRAA >60 10/25/2018 0820   GFRAA >60 08/23/2018 0828    Lab Results  Component Value Date   TOTALPROTELP 7.5 05/08/2017    No results found for: KPAFRELGTCHN, LAMBDASER, KAPLAMBRATIO  Lab Results  Component Value Date   WBC 10.3  11/12/2018   NEUTROABS 7.4 11/12/2018   HGB 13.7 11/12/2018   HCT 41.1 11/12/2018   MCV 100.5 (H) 11/12/2018   PLT 332 11/12/2018    '@LASTCHEMISTRY' @  No results found for: LABCA2  No components found for: TAVWPV948  No results for input(s): INR in the last 168 hours.  No results found for: LABCA2  No results found for: AXK553  No results found for: ZSM270  No results found for: BEM754  No results found for: CA2729  No components found for: HGQUANT  No results found for: CEA1 / No results found for: CEA1   No results found for: AFPTUMOR  No results found for: CHROMOGRNA  No results found for: PSA1  Appointment on 11/12/2018  Component Date Value Ref Range Status  . WBC 11/12/2018 10.3  4.0 - 10.5 K/uL Final  . RBC 11/12/2018 4.09  3.87 - 5.11 MIL/uL Final  . Hemoglobin 11/12/2018 13.7  12.0 - 15.0 g/dL Final  . HCT 11/12/2018 41.1  36.0 - 46.0 % Final  . MCV 11/12/2018 100.5* 80.0 - 100.0 fL Final  . MCH 11/12/2018 33.5  26.0 - 34.0 pg Final  . MCHC 11/12/2018 33.3  30.0 - 36.0 g/dL Final  . RDW 11/12/2018 13.2  11.5 - 15.5 % Final  . Platelets 11/12/2018 332  150 - 400 K/uL Final  . nRBC 11/12/2018 0.0  0.0 - 0.2 % Final  . Neutrophils Relative % 11/12/2018 71  % Final  . Neutro Abs 11/12/2018 7.4  1.7 - 7.7 K/uL Final  . Lymphocytes Relative 11/12/2018 17  % Final  . Lymphs Abs 11/12/2018 1.7  0.7 - 4.0 K/uL Final  . Monocytes Relative 11/12/2018 8  % Final  . Monocytes Absolute 11/12/2018 0.8  0.1 - 1.0 K/uL Final  . Eosinophils Relative 11/12/2018 3  % Final  .  Eosinophils Absolute 11/12/2018 0.3  0.0 - 0.5 K/uL Final  . Basophils Relative 11/12/2018 1  % Final  . Basophils Absolute 11/12/2018 0.1  0.0 - 0.1 K/uL Final  . Immature Granulocytes 11/12/2018 0  % Final  . Abs Immature Granulocytes 11/12/2018 0.04  0.00 - 0.07 K/uL Final   Performed at [facility], [place] [address] [place], [place] [address]    (this displays the last labs from the last 3 days)  Lab Results  Component Value Date   TOTALPROTELP 7.5 05/08/2017   (this displays SPEP labs)  No results found for: KPAFRELGTCHN, LAMBDASER, KAPLAMBRATIO (kappa/lambda light chains)  No results found for: HGBA, HGBA2QUANT, HGBFQUANT, HGBSQUAN (Hemoglobinopathy evaluation)   Lab Results  Component Value Date   LDH 171 05/08/2017    No results found for: IRON, TIBC, IRONPCTSAT (Iron and TIBC)  No results found for: FERRITIN  Urinalysis    Component Value Date/Time   COLORURINE YELLOW 04/04/2014 2112   APPEARANCEUR CLEAR 04/04/2014 2112   LABSPEC 1.017 04/04/2014 2112   PHURINE 6.0 04/04/2014 2112   GLUCOSEU NEGATIVE 04/04/2014 2112   HGBUR NEGATIVE 04/04/2014 2112   BILIRUBINUR NEGATIVE 04/04/2014 2112   [place] NEGATIVE 04/04/2014 2112   PROTEINUR NEGATIVE 04/04/2014 2112   UROBILINOGEN 0.2 04/04/2014 2112   NITRITE NEGATIVE 04/04/2014 2112   LEUKOCYTESUR NEGATIVE 04/04/2014 2112     STUDIES:  No results found.    ELIGIBLE FOR AVAILABLE RESEARCH PROTOCOL: no   ASSESSMENT: 73 y.o. [place] woman status post left breast lower inner quadrant biopsy 01/16/2018 for a clinical T1c N0, stage IA invasive ductal carcinoma, grade 3, estrogen receptor weakly  positive, progesterone receptor negative, with an MIB-1 of 80%, and HER-2 amplified  (1) genetics 03/06/2018 showed no deleterious mutations  (2) status post left lumpectomy and sentinel lymph node sampling 02/18/2018 for a pT2 pN0, stage IIA invasive ductal carcinoma, grade 3, with negative  margins.  (a) a total of 5 lymph nodes were removed   (3) adjuvant chemo immunotherapy consisting of paclitaxel/trastuzumab weekly for 12 weeks, started 03/08/2018.  (a) paclitaxel discontinued after 6 doses because of peripheral neuropathy; last dose 04/12/2018  (4) trastuzumab to be continued to complete a year (through December 2020).  (a) echocardiogram 02/06/2018 shows an ejection fraction in the 60-65% range  (b) echocardiogram 06/06/2018 shows an ejection fraction in the 60-65% range  (c) echocardiogram 09/09/2018 shows an ejection fraction in the 60-65% range  (5) adjuvant radiation 05/30/2018 - 06/26/2018  (a) Left Breast / 42.56 Gy in 16 fractions  (b) Boost / 8 Gy in 4 fractions  (6) anastrozole started June 2020  (a) bone density at [place] 12/27/2017 shows a T score of -1.7  (7) tobacco abuse disorder: Krystian stopped smoking 06/26/2018   PLAN: Kam is doing well on the anastrozole although she is only taking every other day.  Sometimes she cannot remember if she took it or not.  I suggested she only take it on odd days, which would make it easier for her to know whether she needs to take it or not.  I am hopeful by next year she will be used to it enough that we can go to daily, which is of course the standard.  She is tolerating trastuzumab well.  She has had no significant issues from that and she has an echocardiogram already scheduled for September.  She would like to have her treatments changed to Fridays because of concerns regarding her husband's care and we will operational lysed that.  She is taking appropriate pandemic precautions  She knows to call for any other issues that may develop before the next visit.  Shanyia Stines, Margaret Dad, MD  11/12/18 9:58 AM Medical Oncology and Hematology [facility] [address] [place], [place] [address] Tel. [phone]    Fax. [phone]   I, Wilburn Mylar, am acting as scribe for Dr. Virgie Hodges. Epimenio Schetter.  I, Lurline Del MD, have reviewed the above documentation for accuracy and completeness, and I agree with the above.

## 2018-11-12 ENCOUNTER — Inpatient Hospital Stay: Payer: Medicare Other

## 2018-11-12 ENCOUNTER — Ambulatory Visit: Payer: Medicare Other | Admitting: Oncology

## 2018-11-12 ENCOUNTER — Inpatient Hospital Stay (HOSPITAL_BASED_OUTPATIENT_CLINIC_OR_DEPARTMENT_OTHER): Payer: Medicare Other | Admitting: Oncology

## 2018-11-12 ENCOUNTER — Other Ambulatory Visit: Payer: Medicare Other

## 2018-11-12 ENCOUNTER — Inpatient Hospital Stay: Payer: Medicare Other | Attending: Oncology

## 2018-11-12 ENCOUNTER — Other Ambulatory Visit: Payer: Self-pay

## 2018-11-12 ENCOUNTER — Ambulatory Visit: Payer: Medicare Other

## 2018-11-12 VITALS — BP 124/74 | HR 78 | Temp 98.2°F | Resp 18 | Ht 68.0 in | Wt 169.8 lb

## 2018-11-12 DIAGNOSIS — Z79811 Long term (current) use of aromatase inhibitors: Secondary | ICD-10-CM | POA: Insufficient documentation

## 2018-11-12 DIAGNOSIS — C50312 Malignant neoplasm of lower-inner quadrant of left female breast: Secondary | ICD-10-CM

## 2018-11-12 DIAGNOSIS — Z720 Tobacco use: Secondary | ICD-10-CM

## 2018-11-12 DIAGNOSIS — Z95828 Presence of other vascular implants and grafts: Secondary | ICD-10-CM

## 2018-11-12 DIAGNOSIS — F17210 Nicotine dependence, cigarettes, uncomplicated: Secondary | ICD-10-CM | POA: Diagnosis not present

## 2018-11-12 DIAGNOSIS — Z90710 Acquired absence of both cervix and uterus: Secondary | ICD-10-CM | POA: Insufficient documentation

## 2018-11-12 DIAGNOSIS — Z5112 Encounter for antineoplastic immunotherapy: Secondary | ICD-10-CM | POA: Insufficient documentation

## 2018-11-12 DIAGNOSIS — Z170 Estrogen receptor positive status [ER+]: Secondary | ICD-10-CM

## 2018-11-12 DIAGNOSIS — Z923 Personal history of irradiation: Secondary | ICD-10-CM | POA: Insufficient documentation

## 2018-11-12 DIAGNOSIS — Z79899 Other long term (current) drug therapy: Secondary | ICD-10-CM | POA: Diagnosis not present

## 2018-11-12 DIAGNOSIS — Z9221 Personal history of antineoplastic chemotherapy: Secondary | ICD-10-CM | POA: Insufficient documentation

## 2018-11-12 LAB — CBC WITH DIFFERENTIAL/PLATELET
Abs Immature Granulocytes: 0.04 10*3/uL (ref 0.00–0.07)
Basophils Absolute: 0.1 10*3/uL (ref 0.0–0.1)
Basophils Relative: 1 %
Eosinophils Absolute: 0.3 10*3/uL (ref 0.0–0.5)
Eosinophils Relative: 3 %
HCT: 41.1 % (ref 36.0–46.0)
Hemoglobin: 13.7 g/dL (ref 12.0–15.0)
Immature Granulocytes: 0 %
Lymphocytes Relative: 17 %
Lymphs Abs: 1.7 10*3/uL (ref 0.7–4.0)
MCH: 33.5 pg (ref 26.0–34.0)
MCHC: 33.3 g/dL (ref 30.0–36.0)
MCV: 100.5 fL — ABNORMAL HIGH (ref 80.0–100.0)
Monocytes Absolute: 0.8 10*3/uL (ref 0.1–1.0)
Monocytes Relative: 8 %
Neutro Abs: 7.4 10*3/uL (ref 1.7–7.7)
Neutrophils Relative %: 71 %
Platelets: 332 10*3/uL (ref 150–400)
RBC: 4.09 MIL/uL (ref 3.87–5.11)
RDW: 13.2 % (ref 11.5–15.5)
WBC: 10.3 10*3/uL (ref 4.0–10.5)
nRBC: 0 % (ref 0.0–0.2)

## 2018-11-12 LAB — COMPREHENSIVE METABOLIC PANEL
ALT: 22 U/L (ref 0–44)
AST: 15 U/L (ref 15–41)
Albumin: 3.9 g/dL (ref 3.5–5.0)
Alkaline Phosphatase: 98 U/L (ref 38–126)
Anion gap: 12 (ref 5–15)
BUN: 15 mg/dL (ref 8–23)
CO2: 21 mmol/L — ABNORMAL LOW (ref 22–32)
Calcium: 9.6 mg/dL (ref 8.9–10.3)
Chloride: 107 mmol/L (ref 98–111)
Creatinine, Ser: 0.75 mg/dL (ref 0.44–1.00)
GFR calc Af Amer: >60 mL/min (ref >60)
GFR calc non Af Amer: >60 mL/min (ref >60)
Glucose, Bld: 105 mg/dL — ABNORMAL HIGH (ref 70–99)
Potassium: 4.3 mmol/L (ref 3.5–5.1)
Sodium: 140 mmol/L (ref 135–145)
Total Bilirubin: 0.3 mg/dL (ref 0.3–1.2)
Total Protein: 7.4 g/dL (ref 6.5–8.1)

## 2018-11-12 MED ORDER — DIPHENHYDRAMINE HCL 25 MG PO CAPS
25.0000 mg | ORAL_CAPSULE | Freq: Once | ORAL | Status: AC
  Administered 2018-11-12: 25 mg via ORAL

## 2018-11-12 MED ORDER — SODIUM CHLORIDE 0.9% FLUSH
10.0000 mL | INTRAVENOUS | Status: DC | PRN
  Administered 2018-11-12: 10 mL
  Filled 2018-11-12: qty 10

## 2018-11-12 MED ORDER — SODIUM CHLORIDE 0.9 % IV SOLN
Freq: Once | INTRAVENOUS | Status: AC
  Administered 2018-11-12: 10:00:00 via INTRAVENOUS
  Filled 2018-11-12: qty 250

## 2018-11-12 MED ORDER — ACETAMINOPHEN 325 MG PO TABS
ORAL_TABLET | ORAL | Status: AC
  Filled 2018-11-12: qty 2

## 2018-11-12 MED ORDER — ACETAMINOPHEN 325 MG PO TABS
650.0000 mg | ORAL_TABLET | Freq: Once | ORAL | Status: AC
  Administered 2018-11-12: 650 mg via ORAL

## 2018-11-12 MED ORDER — SODIUM CHLORIDE 0.9% FLUSH
10.0000 mL | INTRAVENOUS | Status: DC | PRN
  Filled 2018-11-12: qty 10

## 2018-11-12 MED ORDER — HEPARIN SOD (PORK) LOCK FLUSH 100 UNIT/ML IV SOLN
500.0000 [IU] | Freq: Once | INTRAVENOUS | Status: AC | PRN
  Administered 2018-11-12: 12:00:00 500 [IU]
  Filled 2018-11-12: qty 5

## 2018-11-12 MED ORDER — DIPHENHYDRAMINE HCL 25 MG PO CAPS
ORAL_CAPSULE | ORAL | Status: AC
  Filled 2018-11-12: qty 1

## 2018-11-12 MED ORDER — TRASTUZUMAB-DKST CHEMO 150 MG IV SOLR
450.0000 mg | Freq: Once | INTRAVENOUS | Status: AC
  Administered 2018-11-12: 11:00:00 450 mg via INTRAVENOUS
  Filled 2018-11-12: qty 21.43

## 2018-11-12 NOTE — Patient Instructions (Signed)
Coronavirus (COVID-19) Are you at risk?  Are you at risk for the Coronavirus (COVID-19)?  To be considered HIGH RISK for Coronavirus (COVID-19), you have to meet the following criteria:  . Traveled to [place], [place], [place], [place] or [place]; or in the [place] to [place], [place], [place], or [place]; and have fever, cough, and shortness of breath within the last 2 weeks of travel OR . Been in close contact with a person diagnosed with COVID-19 within the last 2 weeks and have fever, cough, and shortness of breath . IF YOU DO NOT MEET THESE CRITERIA, YOU ARE CONSIDERED LOW RISK FOR COVID-19.  What to do if you are HIGH RISK for COVID-19?  . If you are having a medical emergency, call 911. . Seek medical care right away. Before you go to a doctor's office, urgent care or emergency department, call ahead and tell them about your recent travel, contact with someone diagnosed with COVID-19, and your symptoms. You should receive instructions from your physician's office regarding next steps of care.  . When you arrive at healthcare provider, tell the healthcare staff immediately you have returned from visiting [place], [place], [place], [place] or [place]; or traveled in the [place] to [place], [place], [place], or [place]; in the last two weeks or you have been in close contact with a person diagnosed with COVID-19 in the last 2 weeks.   . Tell the health care staff about your symptoms: fever, cough and shortness of breath. . After you have been seen by a medical provider, you will be either: o Tested for (COVID-19) and discharged home on quarantine except to seek medical care if symptoms worsen, and asked to  - Stay home and avoid contact with others until you get your results (4-5 days)  - Avoid travel on public transportation if possible (such as bus, train, or airplane) or o Sent to the Emergency Department by EMS for evaluation, COVID-19 testing, and possible  admission depending on your condition and test results.  What to do if you are LOW RISK for COVID-19?  Reduce your risk of any infection by using the same precautions used for avoiding the common cold or flu:  . Wash your hands often with soap and warm water for at least 20 seconds.  If soap and water are not readily available, use an alcohol-based hand sanitizer with at least 60% alcohol.  . If coughing or sneezing, cover your mouth and nose by coughing or sneezing into the elbow areas of your shirt or coat, into a tissue or into your sleeve (not your hands). . Avoid shaking hands with others and consider head nods or verbal greetings only. . Avoid touching your eyes, nose, or mouth with unwashed hands.  . Avoid close contact with people who are sick. . Avoid places or events with large numbers of people in one location, like concerts or sporting events. . Carefully consider travel plans you have or are making. . If you are planning any travel outside or inside the [place], visit the CDC's Travelers' Health webpage for the latest health notices. . If you have some symptoms but not all symptoms, continue to monitor at home and seek medical attention if your symptoms worsen. . If you are having a medical emergency, call 911.   ADDITIONAL HEALTHCARE OPTIONS FOR PATIENTS  [facility] / e-Visit: [email or web address]         [facility]: [phone]  Onset   Urgent Care: [facility]: [place] Discharge Instructions for Patients Receiving Chemotherapy  Today you received the following chemotherapy agents Trastuzumab-dkst  To help prevent nausea and vomiting after your treatment, we encourage you to take your nausea medication as directed.    If you develop nausea and vomiting that is not controlled by your nausea medication, call the clinic.    BELOW ARE SYMPTOMS THAT SHOULD BE REPORTED IMMEDIATELY:  *FEVER GREATER THAN 100.5 F  *CHILLS WITH OR WITHOUT FEVER  NAUSEA AND VOMITING THAT IS NOT CONTROLLED WITH YOUR NAUSEA MEDICATION  *UNUSUAL SHORTNESS OF BREATH  *UNUSUAL BRUISING OR BLEEDING  TENDERNESS IN MOUTH AND THROAT WITH OR WITHOUT PRESENCE OF ULCERS  *URINARY PROBLEMS  *BOWEL PROBLEMS  UNUSUAL RASH Items with * indicate a potential emergency and should be followed up as soon as possible.  Feel free to call the clinic should you have any questions or concerns. The clinic phone number is [phone].  Please show the [place] at check-in to the Emergency Department and triage nurse.

## 2018-11-12 NOTE — Patient Instructions (Signed)

## 2018-11-14 ENCOUNTER — Ambulatory Visit: Payer: Medicare Other | Admitting: Adult Health

## 2018-11-14 ENCOUNTER — Other Ambulatory Visit: Payer: Medicare Other

## 2018-11-14 ENCOUNTER — Telehealth: Payer: Self-pay | Admitting: Oncology

## 2018-11-14 ENCOUNTER — Ambulatory Visit: Payer: Medicare Other

## 2018-11-14 NOTE — Telephone Encounter (Signed)
I left a message regarding schedule I will mail °

## 2018-12-03 ENCOUNTER — Ambulatory Visit: Payer: Medicare Other

## 2018-12-03 ENCOUNTER — Other Ambulatory Visit: Payer: Medicare Other

## 2018-12-06 ENCOUNTER — Other Ambulatory Visit: Payer: Medicare Other

## 2018-12-06 ENCOUNTER — Ambulatory Visit: Payer: Medicare Other

## 2018-12-06 ENCOUNTER — Inpatient Hospital Stay: Payer: Medicare Other

## 2018-12-06 ENCOUNTER — Other Ambulatory Visit: Payer: Self-pay

## 2018-12-06 ENCOUNTER — Inpatient Hospital Stay: Payer: Medicare Other | Attending: Oncology

## 2018-12-06 VITALS — BP 127/81 | HR 81 | Temp 98.5°F | Resp 18

## 2018-12-06 DIAGNOSIS — Z5112 Encounter for antineoplastic immunotherapy: Secondary | ICD-10-CM | POA: Diagnosis not present

## 2018-12-06 DIAGNOSIS — Z95828 Presence of other vascular implants and grafts: Secondary | ICD-10-CM

## 2018-12-06 DIAGNOSIS — Z170 Estrogen receptor positive status [ER+]: Secondary | ICD-10-CM

## 2018-12-06 DIAGNOSIS — Z720 Tobacco use: Secondary | ICD-10-CM

## 2018-12-06 DIAGNOSIS — C50312 Malignant neoplasm of lower-inner quadrant of left female breast: Secondary | ICD-10-CM

## 2018-12-06 LAB — CBC WITH DIFFERENTIAL/PLATELET
Abs Immature Granulocytes: 0.03 10*3/uL (ref 0.00–0.07)
Basophils Absolute: 0.1 10*3/uL (ref 0.0–0.1)
Basophils Relative: 1 %
Eosinophils Absolute: 0.3 10*3/uL (ref 0.0–0.5)
Eosinophils Relative: 3 %
HCT: 42 % (ref 36.0–46.0)
Hemoglobin: 13.9 g/dL (ref 12.0–15.0)
Immature Granulocytes: 0 %
Lymphocytes Relative: 19 %
Lymphs Abs: 1.9 10*3/uL (ref 0.7–4.0)
MCH: 33.1 pg (ref 26.0–34.0)
MCHC: 33.1 g/dL (ref 30.0–36.0)
MCV: 100 fL (ref 80.0–100.0)
Monocytes Absolute: 0.9 10*3/uL (ref 0.1–1.0)
Monocytes Relative: 9 %
Neutro Abs: 6.5 10*3/uL (ref 1.7–7.7)
Neutrophils Relative %: 68 %
Platelets: 312 10*3/uL (ref 150–400)
RBC: 4.2 MIL/uL (ref 3.87–5.11)
RDW: 13.2 % (ref 11.5–15.5)
WBC: 9.7 10*3/uL (ref 4.0–10.5)
nRBC: 0 % (ref 0.0–0.2)

## 2018-12-06 LAB — COMPREHENSIVE METABOLIC PANEL
ALT: 22 U/L (ref 0–44)
AST: 16 U/L (ref 15–41)
Albumin: 4 g/dL (ref 3.5–5.0)
Alkaline Phosphatase: 102 U/L (ref 38–126)
Anion gap: 8 (ref 5–15)
BUN: 11 mg/dL (ref 8–23)
CO2: 25 mmol/L (ref 22–32)
Calcium: 9.6 mg/dL (ref 8.9–10.3)
Chloride: 106 mmol/L (ref 98–111)
Creatinine, Ser: 0.81 mg/dL (ref 0.44–1.00)
GFR calc Af Amer: >60 mL/min (ref >60)
GFR calc non Af Amer: >60 mL/min (ref >60)
Glucose, Bld: 99 mg/dL (ref 70–99)
Potassium: 4.3 mmol/L (ref 3.5–5.1)
Sodium: 139 mmol/L (ref 135–145)
Total Bilirubin: 0.4 mg/dL (ref 0.3–1.2)
Total Protein: 7.3 g/dL (ref 6.5–8.1)

## 2018-12-06 MED ORDER — DIPHENHYDRAMINE HCL 25 MG PO CAPS
ORAL_CAPSULE | ORAL | Status: AC
  Filled 2018-12-06: qty 2

## 2018-12-06 MED ORDER — DIPHENHYDRAMINE HCL 25 MG PO CAPS
25.0000 mg | ORAL_CAPSULE | Freq: Once | ORAL | Status: AC
  Administered 2018-12-06: 25 mg via ORAL

## 2018-12-06 MED ORDER — ACETAMINOPHEN 325 MG PO TABS
ORAL_TABLET | ORAL | Status: AC
  Filled 2018-12-06: qty 2

## 2018-12-06 MED ORDER — TRASTUZUMAB-DKST CHEMO 150 MG IV SOLR
450.0000 mg | Freq: Once | INTRAVENOUS | Status: AC
  Administered 2018-12-06: 450 mg via INTRAVENOUS
  Filled 2018-12-06: qty 21.43

## 2018-12-06 MED ORDER — SODIUM CHLORIDE 0.9% FLUSH
10.0000 mL | INTRAVENOUS | Status: DC | PRN
  Administered 2018-12-06: 12:00:00 10 mL
  Filled 2018-12-06: qty 10

## 2018-12-06 MED ORDER — ACETAMINOPHEN 325 MG PO TABS
650.0000 mg | ORAL_TABLET | Freq: Once | ORAL | Status: AC
  Administered 2018-12-06: 650 mg via ORAL

## 2018-12-06 MED ORDER — HEPARIN SOD (PORK) LOCK FLUSH 100 UNIT/ML IV SOLN
500.0000 [IU] | Freq: Once | INTRAVENOUS | Status: AC | PRN
  Administered 2018-12-06: 12:00:00 500 [IU]
  Filled 2018-12-06: qty 5

## 2018-12-06 MED ORDER — SODIUM CHLORIDE 0.9% FLUSH
10.0000 mL | INTRAVENOUS | Status: DC | PRN
  Administered 2018-12-06: 10:00:00 10 mL
  Filled 2018-12-06: qty 10

## 2018-12-06 MED ORDER — SODIUM CHLORIDE 0.9 % IV SOLN
Freq: Once | INTRAVENOUS | Status: AC
  Administered 2018-12-06: 10:00:00 via INTRAVENOUS
  Filled 2018-12-06: qty 250

## 2018-12-06 NOTE — Patient Instructions (Signed)
[facility] Discharge Instructions for Patients Receiving Chemotherapy  Today you received the following chemotherapy agents Trastuzumab (HERCEPTIN).  To help prevent nausea and vomiting after your treatment, we encourage you to take your nausea medication as prescribed.  If you develop nausea and vomiting that is not controlled by your nausea medication, call the clinic.   BELOW ARE SYMPTOMS THAT SHOULD BE REPORTED IMMEDIATELY:  *FEVER GREATER THAN 100.5 F  *CHILLS WITH OR WITHOUT FEVER  NAUSEA AND VOMITING THAT IS NOT CONTROLLED WITH YOUR NAUSEA MEDICATION  *UNUSUAL SHORTNESS OF BREATH  *UNUSUAL BRUISING OR BLEEDING  TENDERNESS IN MOUTH AND THROAT WITH OR WITHOUT PRESENCE OF ULCERS  *URINARY PROBLEMS  *BOWEL PROBLEMS  UNUSUAL RASH Items with * indicate a potential emergency and should be followed up as soon as possible.  Feel free to call the clinic should you have any questions or concerns. The clinic phone number is [phone].  Please show the CHEMO ALERT CARD at check-in to the Emergency Department and triage nurse.  Coronavirus (COVID-19) Are you at risk?  Are you at risk for the Coronavirus (COVID-19)?  To be considered HIGH RISK for Coronavirus (COVID-19), you have to meet the following criteria:  . Traveled to [place], [place], [place], [place] or [place]; or in the [place] to [place], [place], [place], or [place]; and have fever, cough, and shortness of breath within the last 2 weeks of travel OR . Been in close contact with a person diagnosed with COVID-19 within the last 2 weeks and have fever, cough, and shortness of breath . IF YOU DO NOT MEET THESE CRITERIA, YOU ARE CONSIDERED LOW RISK FOR COVID-19.  What to do if you are HIGH RISK for COVID-19?  . If you are having a medical emergency, call 911. . Seek medical care right away. Before you go to a doctor's office, urgent care or emergency department, call ahead and tell them  about your recent travel, contact with someone diagnosed with COVID-19, and your symptoms. You should receive instructions from your physician's office regarding next steps of care.  . When you arrive at healthcare provider, tell the healthcare staff immediately you have returned from visiting [place], [place], [place], [place] or [place]; or traveled in the [place] to [place], [place], [place], or [place]; in the last two weeks or you have been in close contact with a person diagnosed with COVID-19 in the last 2 weeks.   . Tell the health care staff about your symptoms: fever, cough and shortness of breath. . After you have been seen by a medical provider, you will be either: o Tested for (COVID-19) and discharged home on quarantine except to seek medical care if symptoms worsen, and asked to  - Stay home and avoid contact with others until you get your results (4-5 days)  - Avoid travel on public transportation if possible (such as bus, train, or airplane) or o Sent to the Emergency Department by EMS for evaluation, COVID-19 testing, and possible admission depending on your condition and test results.  What to do if you are LOW RISK for COVID-19?  Reduce your risk of any infection by using the same precautions used for avoiding the common cold or flu:  . Wash your hands often with soap and warm water for at least 20 seconds.  If soap and water are not readily available, use an alcohol-based hand sanitizer with at least 60% alcohol.  . If coughing or sneezing,   cover your mouth and nose by coughing or sneezing into the elbow areas of your shirt or coat, into a tissue or into your sleeve (not your hands). . Avoid shaking hands with others and consider head nods or verbal greetings only. . Avoid touching your eyes, nose, or mouth with unwashed hands.  . Avoid close contact with people who are sick. . Avoid places or events with large numbers of people in one location, like concerts or  sporting events. . Carefully consider travel plans you have or are making. . If you are planning any travel outside or inside the [place], visit the CDC's Travelers' Health webpage for the latest health notices. . If you have some symptoms but not all symptoms, continue to monitor at home and seek medical attention if your symptoms worsen. . If you are having a medical emergency, call 911.   ADDITIONAL HEALTHCARE OPTIONS FOR PATIENTS  [facility] / e-Visit: [email or web address]         [facility]: [phone]   Urgent Care: [phone]                   [facility]: [phone]   

## 2018-12-15 NOTE — Progress Notes (Signed)
Cardio-Oncology Clinic Note   Referring Physician: Dr. Jana Hakim Primary Care: [name], MD Primary Cardiologist: New  HPI:  Margaret Hodges is a 73 y.o. female with past medical history of COPD with ongoing tobacco use, HTN, Guillane Barre syndrome and left breast cancer who has been referred by Dr. Jana Hakim to establish in the cardio-oncology clinic for monitoring of cardio-toxicity while undergoing chemotherapy.  Oncologic history 73 y.o. [place] woman status post left breast lower inner quadrant biopsy 01/16/2018 for a clinical T1c N0, stage IA invasive ductal carcinoma, grade 3, estrogen receptor weakly positive, progesterone receptor negative, with an MIB-1 of 80%, and HER-2 amplified  (1) Genetics 03/06/2018 showed no deleterious mutations  (2) Status post left lumpectomy and sentinel lymph node sampling 02/18/2018 for a pT2 pN0, stage IIA invasive ductal carcinoma, grade 3, with negative margins.             (a) a total of 5 lymph nodes were removed   (3) Adjuvant chemo immunotherapy consisting of paclitaxel/trastuzumab weekly for 12 weeks, started 03/08/2018.             (a) paclitaxel discontinued after 6 doses because of peripheral neuropathy; last dose 04/12/2018  (4) Trastuzumab to be continued to complete a year  (5) Adjuvant radiation started 05/31/18 and completed  (6) Antiestrogens to follow at the completion of local treatment.             (a) bone density at [facility] 12/27/2017 shows a T score of -1.7  She returns for routine f/u. Doing OK. Tolerating herceptin fairly well except for some leg pains, fatigue and mouth ulcers. Will finish 02/25/19. Still taking care of her husband with SNP. Smoking < 1ppd - uses it as her stress relief. Stays realtively active. Does all her own housework and works out in the yard. Walks up and down steps all day long. No CP, edema, orthopnea or PND.   Echo 12/16/18 EF 60-65% Grade 1 DD Personally reviewed  Echo  09/09/18 EF 60-65% Echo 06/06/18 60-65%,  Echo 02/06/2018 LVEF 60-65%, GLS -22.1%, No AI, No MR.   Past Medical History:  Diagnosis Date  . Anginal pain ([place])    with admission to hospital due to high blood pressure  . Complication of anesthesia   . Family history of ovarian cancer   . GERD (gastroesophageal reflux disease)   . Guillain Barr syndrome ([place]) 1959  . Guillain Barr syndrome ([place]) 1959  . Guillain Barr syndrome ([place]) 1959  . Headache    sinus headaches  . History of hiatal hernia    was told pt. had one, but no testing  . Hypertensive urgency 04/05/2014  . Malignant neoplasm of lower-inner quadrant of left breast in female, estrogen receptor positive ([place]) 01/24/2018  . Neuromuscular disorder ([place])    Guillian- Barre Syndrome  . Pneumonia    hx. of walking pneumonia  . PONV (postoperative nausea and vomiting)    Current Outpatient Medications  Medication Sig Dispense Refill  . amLODipine (NORVASC) 5 MG tablet Take 1 tablet (5 mg total) by mouth daily. 30 tablet 0  . anastrozole (ARIMIDEX) 1 MG tablet Take 1 tablet (1 mg total) by mouth daily. 90 tablet 4  . Cholecalciferol (VITAMIN D) 2000 UNITS tablet Take 2,000 Units by mouth daily.    . clobetasol cream (TEMOVATE) 4.19 % Apply 1 application topically 2 (two) times daily. 30 g 0  . diclofenac sodium (VOLTAREN) 1 % GEL Apply 4 g topically 4 (four) times daily  as needed (arthritis pain).     . hyoscyamine (LEVBID) 0.375 MG 12 hr tablet Take 0.375 mg by mouth every 12 (twelve) hours as needed (blaader spasms).    Marland Kitchen levothyroxine (SYNTHROID, LEVOTHROID) 100 MCG tablet Take 100 mcg by mouth daily.    Marland Kitchen omeprazole (PRILOSEC) 40 MG capsule Take 1 capsule (40 mg total) by mouth daily. 60 capsule 4  . saccharomyces boulardii (FLORASTOR) 250 MG capsule Take 250 mg by mouth daily as needed (only when on antibiotic).     No current facility-administered medications for this encounter.    Allergies  Allergen Reactions  .  Allegra [Fexofenadine] Hives  . Clindamycin/Lincomycin Hives and Diarrhea  . Influenza A (H1n1) [place] Other (See Comments)    Guillian Barre Syndrome  . Keflex [Cephalexin] Hives  . Penicillins Swelling    Has patient had a PCN reaction causing immediate rash, facial/tongue/throat swelling, SOB or lightheadedness with hypotension: No Has patient had a PCN reaction causing severe rash involving mucus membranes or skin necrosis: No Has patient had a PCN reaction that required hospitalization No Has patient had a PCN reaction occurring within the last 10 years: No If all of the above answers are "NO", then may proceed with Cephalosporin use.   . Prednisone Other (See Comments)    "heart attack symptoms" but even had heart cath & had no cardiac disease  . Protonix [Pantoprazole Sodium] Diarrhea    Tolerates Prilosec  . Statins Other (See Comments)    myalgias   Social History   Socioeconomic History  . Marital status: Married    Spouse name: Not on file  . Number of children: Not on file  . Years of education: Not on file  . Highest education level: Not on file  Occupational History  . Not on file  Social Needs  . Financial resource strain: Not on file  . Food insecurity    Worry: Not on file    Inability: Not on file  . Transportation needs    Medical: Not on file    Non-medical: Not on file  Tobacco Use  . Smoking status: Current Every Day Smoker    Packs/day: 1.50    Types: Cigarettes  . Smokeless tobacco: Never Used  Substance and Sexual Activity  . Alcohol use: No  . Drug use: No  . Sexual activity: Not Currently  Lifestyle  . Physical activity    Days per week: Not on file    Minutes per session: Not on file  . Stress: Not on file  Relationships  . Social Herbalist on phone: Not on file    Gets together: Not on file    Attends religious service: Not on file    Active member of club or organization: Not on file    Attends meetings of clubs or  organizations: Not on file    Relationship status: Not on file  . Intimate partner violence    Fear of current or ex partner: Not on file    Emotionally abused: Not on file    Physically abused: Not on file    Forced sexual activity: Not on file  Other Topics Concern  . Not on file  Social History Narrative  . Not on file    Family History  Problem Relation Age of Onset  . Ovarian cancer Sister   . Lung cancer Sister   . Throat cancer Brother   . Brain cancer Sister    Vitals:  12/16/18 1003  BP: 138/80  Pulse: 76  SpO2: 96%  Weight: 78.7 kg (173 lb 8 oz)   PHYSICAL EXAM: General:  Well appearing. No resp difficulty HEENT: normal Neck: supple. no JVD. Carotids 2+ bilat; no bruits. No lymphadenopathy or thryomegaly appreciated. Cor: PMI nondisplaced. Regular rate & rhythm. No rubs, gallops or murmurs. Lungs: clear with reduced BS throughout Abdomen: soft, nontender, nondistended. No hepatosplenomegaly. No bruits or masses. Good bowel sounds. Extremities: no cyanosis, clubbing, rash, edema Neuro: alert & orientedx3, cranial nerves grossly intact. moves all 4 extremities w/o difficulty. Affect pleasant   ASSESSMENT & PLAN:  1. Left breast cancer - Invasive ductal carcinoma, grade III. Prognostic indicators significant for: estrogen receptor, 60% positive, with weak staining intensity; progesterone receptor negative, Proliferation marker Ki67 at 80%. HER2 positive by immunohistochemistry, 3+. - I reviewed echos personally. EF and Doppler parameters stable. No HF on exam. Continue Herceptin.  - She only has 4 more treatments left. I think we can forego the last echo given COVID concerns for her and her husband  2. Tobacco abuse - Continues to smoke but has cut back. Counseled on smoking cessation/reduction but says she needs it as stress relief.   3. HTN - Continue amlodipine. Can increase as needed.   Glori Bickers, MD 12/15/18

## 2018-12-16 ENCOUNTER — Ambulatory Visit (HOSPITAL_COMMUNITY)
Admission: RE | Admit: 2018-12-16 | Discharge: 2018-12-16 | Disposition: A | Discharge status: Home / Self Care | Payer: Medicare Other | Source: Ambulatory Visit | Attending: Internal Medicine | Admitting: Internal Medicine

## 2018-12-16 ENCOUNTER — Encounter (HOSPITAL_COMMUNITY): Payer: Self-pay | Admitting: Internal Medicine

## 2018-12-16 ENCOUNTER — Other Ambulatory Visit: Payer: Self-pay

## 2018-12-16 ENCOUNTER — Ambulatory Visit (HOSPITAL_BASED_OUTPATIENT_CLINIC_OR_DEPARTMENT_OTHER)
Admission: RE | Admit: 2018-12-16 | Discharge: 2018-12-16 | Disposition: A | Discharge status: Home / Self Care | Payer: Medicare Other | Source: Ambulatory Visit | Attending: Internal Medicine | Admitting: Internal Medicine

## 2018-12-16 VITALS — BP 138/80 | HR 76 | Wt 173.5 lb

## 2018-12-16 DIAGNOSIS — I358 Other nonrheumatic aortic valve disorders: Secondary | ICD-10-CM | POA: Diagnosis not present

## 2018-12-16 DIAGNOSIS — Z881 Allergy status to other antibiotic agents status: Secondary | ICD-10-CM | POA: Insufficient documentation

## 2018-12-16 DIAGNOSIS — Z888 Allergy status to other drugs, medicaments and biological substances status: Secondary | ICD-10-CM | POA: Diagnosis not present

## 2018-12-16 DIAGNOSIS — C50312 Malignant neoplasm of lower-inner quadrant of left female breast: Secondary | ICD-10-CM | POA: Diagnosis not present

## 2018-12-16 DIAGNOSIS — Z170 Estrogen receptor positive status [ER+]: Secondary | ICD-10-CM

## 2018-12-16 DIAGNOSIS — F17200 Nicotine dependence, unspecified, uncomplicated: Secondary | ICD-10-CM | POA: Diagnosis not present

## 2018-12-16 DIAGNOSIS — I10 Essential (primary) hypertension: Secondary | ICD-10-CM

## 2018-12-16 DIAGNOSIS — Z79890 Hormone replacement therapy (postmenopausal): Secondary | ICD-10-CM | POA: Insufficient documentation

## 2018-12-16 DIAGNOSIS — F17210 Nicotine dependence, cigarettes, uncomplicated: Secondary | ICD-10-CM | POA: Insufficient documentation

## 2018-12-16 DIAGNOSIS — Z79899 Other long term (current) drug therapy: Secondary | ICD-10-CM | POA: Insufficient documentation

## 2018-12-16 DIAGNOSIS — J449 Chronic obstructive pulmonary disease, unspecified: Secondary | ICD-10-CM | POA: Diagnosis not present

## 2018-12-16 DIAGNOSIS — Z880 Allergy status to penicillin: Secondary | ICD-10-CM | POA: Insufficient documentation

## 2018-12-16 NOTE — Addendum Note (Signed)
Encounter addended by: Valeda Malm, RN on: 12/16/2018 10:57 AM  Actions taken: Clinical Note Signed

## 2018-12-16 NOTE — Progress Notes (Signed)
  Echocardiogram 2D Echocardiogram has been performed.  Margaret Hodges 12/16/2018, 10:02 AM

## 2018-12-16 NOTE — Patient Instructions (Signed)
YOU HAVE GRADUATED FROM THE [facility]!!! YAY  PLEASE DO NOT HESITATE TO CALL OUR OFFICE IF THERE ARE ANY NEEDS OR CONCERNS :)

## 2018-12-27 ENCOUNTER — Inpatient Hospital Stay: Payer: Medicare Other | Attending: Oncology

## 2018-12-27 ENCOUNTER — Inpatient Hospital Stay: Payer: Medicare Other

## 2018-12-27 ENCOUNTER — Other Ambulatory Visit: Payer: Self-pay

## 2018-12-27 VITALS — BP 128/75 | HR 71 | Temp 98.0°F | Resp 18

## 2018-12-27 DIAGNOSIS — Z87891 Personal history of nicotine dependence: Secondary | ICD-10-CM | POA: Insufficient documentation

## 2018-12-27 DIAGNOSIS — Z5112 Encounter for antineoplastic immunotherapy: Secondary | ICD-10-CM | POA: Diagnosis not present

## 2018-12-27 DIAGNOSIS — Z79811 Long term (current) use of aromatase inhibitors: Secondary | ICD-10-CM | POA: Insufficient documentation

## 2018-12-27 DIAGNOSIS — C50312 Malignant neoplasm of lower-inner quadrant of left female breast: Secondary | ICD-10-CM | POA: Diagnosis not present

## 2018-12-27 DIAGNOSIS — Z95828 Presence of other vascular implants and grafts: Secondary | ICD-10-CM

## 2018-12-27 DIAGNOSIS — Z923 Personal history of irradiation: Secondary | ICD-10-CM | POA: Diagnosis not present

## 2018-12-27 DIAGNOSIS — Z170 Estrogen receptor positive status [ER+]: Secondary | ICD-10-CM | POA: Diagnosis not present

## 2018-12-27 DIAGNOSIS — Z720 Tobacco use: Secondary | ICD-10-CM

## 2018-12-27 LAB — COMPREHENSIVE METABOLIC PANEL
ALT: 19 U/L (ref 0–44)
AST: 15 U/L (ref 15–41)
Albumin: 4 g/dL (ref 3.5–5.0)
Alkaline Phosphatase: 105 U/L (ref 38–126)
Anion gap: 8 (ref 5–15)
BUN: 15 mg/dL (ref 8–23)
CO2: 24 mmol/L (ref 22–32)
Calcium: 9.7 mg/dL (ref 8.9–10.3)
Chloride: 107 mmol/L (ref 98–111)
Creatinine, Ser: 0.77 mg/dL (ref 0.44–1.00)
GFR calc Af Amer: >60 mL/min (ref >60)
GFR calc non Af Amer: >60 mL/min (ref >60)
Glucose, Bld: 103 mg/dL — ABNORMAL HIGH (ref 70–99)
Potassium: 4.5 mmol/L (ref 3.5–5.1)
Sodium: 139 mmol/L (ref 135–145)
Total Bilirubin: 0.4 mg/dL (ref 0.3–1.2)
Total Protein: 7.3 g/dL (ref 6.5–8.1)

## 2018-12-27 LAB — CBC WITH DIFFERENTIAL/PLATELET
Abs Immature Granulocytes: 0.04 10*3/uL (ref 0.00–0.07)
Basophils Absolute: 0.1 10*3/uL (ref 0.0–0.1)
Basophils Relative: 1 %
Eosinophils Absolute: 0.3 10*3/uL (ref 0.0–0.5)
Eosinophils Relative: 3 %
HCT: 41.1 % (ref 36.0–46.0)
Hemoglobin: 13.8 g/dL (ref 12.0–15.0)
Immature Granulocytes: 0 %
Lymphocytes Relative: 15 %
Lymphs Abs: 1.6 10*3/uL (ref 0.7–4.0)
MCH: 33 pg (ref 26.0–34.0)
MCHC: 33.6 g/dL (ref 30.0–36.0)
MCV: 98.3 fL (ref 80.0–100.0)
Monocytes Absolute: 1 10*3/uL (ref 0.1–1.0)
Monocytes Relative: 9 %
Neutro Abs: 7.8 10*3/uL — ABNORMAL HIGH (ref 1.7–7.7)
Neutrophils Relative %: 72 %
Platelets: 307 10*3/uL (ref 150–400)
RBC: 4.18 MIL/uL (ref 3.87–5.11)
RDW: 13.1 % (ref 11.5–15.5)
WBC: 10.7 10*3/uL — ABNORMAL HIGH (ref 4.0–10.5)
nRBC: 0 % (ref 0.0–0.2)

## 2018-12-27 MED ORDER — DIPHENHYDRAMINE HCL 25 MG PO CAPS
ORAL_CAPSULE | ORAL | Status: AC
  Filled 2018-12-27: qty 1

## 2018-12-27 MED ORDER — TRASTUZUMAB-DKST CHEMO 150 MG IV SOLR
450.0000 mg | Freq: Once | INTRAVENOUS | Status: AC
  Administered 2018-12-27: 450 mg via INTRAVENOUS
  Filled 2018-12-27: qty 21.43

## 2018-12-27 MED ORDER — ACETAMINOPHEN 325 MG PO TABS
ORAL_TABLET | ORAL | Status: AC
  Filled 2018-12-27: qty 2

## 2018-12-27 MED ORDER — SODIUM CHLORIDE 0.9 % IV SOLN
Freq: Once | INTRAVENOUS | Status: AC
  Administered 2018-12-27: 10:00:00 via INTRAVENOUS
  Filled 2018-12-27: qty 250

## 2018-12-27 MED ORDER — SODIUM CHLORIDE 0.9% FLUSH
10.0000 mL | INTRAVENOUS | Status: DC | PRN
  Administered 2018-12-27: 11:00:00 10 mL
  Filled 2018-12-27: qty 10

## 2018-12-27 MED ORDER — SODIUM CHLORIDE 0.9% FLUSH
10.0000 mL | INTRAVENOUS | Status: DC | PRN
  Administered 2018-12-27: 10 mL
  Filled 2018-12-27: qty 10

## 2018-12-27 MED ORDER — ACETAMINOPHEN 325 MG PO TABS
650.0000 mg | ORAL_TABLET | Freq: Once | ORAL | Status: AC
  Administered 2018-12-27: 650 mg via ORAL

## 2018-12-27 MED ORDER — HEPARIN SOD (PORK) LOCK FLUSH 100 UNIT/ML IV SOLN
500.0000 [IU] | Freq: Once | INTRAVENOUS | Status: AC | PRN
  Administered 2018-12-27: 11:00:00 500 [IU]
  Filled 2018-12-27: qty 5

## 2018-12-27 MED ORDER — DIPHENHYDRAMINE HCL 25 MG PO CAPS
25.0000 mg | ORAL_CAPSULE | Freq: Once | ORAL | Status: AC
  Administered 2018-12-27: 25 mg via ORAL

## 2018-12-27 NOTE — Patient Instructions (Signed)
[facility] Discharge Instructions for Patients Receiving Chemotherapy  Today you received the following chemotherapy agents Trastuzumab (HERCEPTIN).  To help prevent nausea and vomiting after your treatment, we encourage you to take your nausea medication as prescribed.  If you develop nausea and vomiting that is not controlled by your nausea medication, call the clinic.   BELOW ARE SYMPTOMS THAT SHOULD BE REPORTED IMMEDIATELY:  *FEVER GREATER THAN 100.5 F  *CHILLS WITH OR WITHOUT FEVER  NAUSEA AND VOMITING THAT IS NOT CONTROLLED WITH YOUR NAUSEA MEDICATION  *UNUSUAL SHORTNESS OF BREATH  *UNUSUAL BRUISING OR BLEEDING  TENDERNESS IN MOUTH AND THROAT WITH OR WITHOUT PRESENCE OF ULCERS  *URINARY PROBLEMS  *BOWEL PROBLEMS  UNUSUAL RASH Items with * indicate a potential emergency and should be followed up as soon as possible.  Feel free to call the clinic should you have any questions or concerns. The clinic phone number is [phone].  Please show the CHEMO ALERT CARD at check-in to the Emergency Department and triage nurse.  Coronavirus (COVID-19) Are you at risk?  Are you at risk for the Coronavirus (COVID-19)?  To be considered HIGH RISK for Coronavirus (COVID-19), you have to meet the following criteria:  . Traveled to [place], [place], [place], [place] or [place]; or in the [place] to [place], [place], [place], or [place]; and have fever, cough, and shortness of breath within the last 2 weeks of travel OR . Been in close contact with a person diagnosed with COVID-19 within the last 2 weeks and have fever, cough, and shortness of breath . IF YOU DO NOT MEET THESE CRITERIA, YOU ARE CONSIDERED LOW RISK FOR COVID-19.  What to do if you are HIGH RISK for COVID-19?  . If you are having a medical emergency, call 911. . Seek medical care right away. Before you go to a doctor's office, urgent care or emergency department, call ahead and tell them  about your recent travel, contact with someone diagnosed with COVID-19, and your symptoms. You should receive instructions from your physician's office regarding next steps of care.  . When you arrive at healthcare provider, tell the healthcare staff immediately you have returned from visiting [place], [place], [place], [place] or [place]; or traveled in the [place] to [place], [place], [place], or [place]; in the last two weeks or you have been in close contact with a person diagnosed with COVID-19 in the last 2 weeks.   . Tell the health care staff about your symptoms: fever, cough and shortness of breath. . After you have been seen by a medical provider, you will be either: o Tested for (COVID-19) and discharged home on quarantine except to seek medical care if symptoms worsen, and asked to  - Stay home and avoid contact with others until you get your results (4-5 days)  - Avoid travel on public transportation if possible (such as bus, train, or airplane) or o Sent to the Emergency Department by EMS for evaluation, COVID-19 testing, and possible admission depending on your condition and test results.  What to do if you are LOW RISK for COVID-19?  Reduce your risk of any infection by using the same precautions used for avoiding the common cold or flu:  . Wash your hands often with soap and warm water for at least 20 seconds.  If soap and water are not readily available, use an alcohol-based hand sanitizer with at least 60% alcohol.  . If coughing or sneezing,   cover your mouth and nose by coughing or sneezing into the elbow areas of your shirt or coat, into a tissue or into your sleeve (not your hands). . Avoid shaking hands with others and consider head nods or verbal greetings only. . Avoid touching your eyes, nose, or mouth with unwashed hands.  . Avoid close contact with people who are sick. . Avoid places or events with large numbers of people in one location, like concerts or  sporting events. . Carefully consider travel plans you have or are making. . If you are planning any travel outside or inside the [place], visit the CDC's Travelers' Health webpage for the latest health notices. . If you have some symptoms but not all symptoms, continue to monitor at home and seek medical attention if your symptoms worsen. . If you are having a medical emergency, call 911.   ADDITIONAL HEALTHCARE OPTIONS FOR PATIENTS  [facility] / e-Visit: [email or web address]         [facility]: [phone]  [facility]: [phone]                   [facility]: [phone]   

## 2018-12-29 ENCOUNTER — Other Ambulatory Visit: Payer: Self-pay | Admitting: Oncology

## 2019-01-07 DIAGNOSIS — G894 Chronic pain syndrome: Secondary | ICD-10-CM | POA: Diagnosis not present

## 2019-01-08 ENCOUNTER — Other Ambulatory Visit: Payer: Self-pay | Admitting: General Surgery

## 2019-01-08 DIAGNOSIS — C50312 Malignant neoplasm of lower-inner quadrant of left female breast: Secondary | ICD-10-CM

## 2019-01-16 ENCOUNTER — Other Ambulatory Visit: Payer: Self-pay | Admitting: General Surgery

## 2019-01-16 DIAGNOSIS — Z853 Personal history of malignant neoplasm of breast: Secondary | ICD-10-CM

## 2019-01-17 ENCOUNTER — Inpatient Hospital Stay: Payer: Medicare Other

## 2019-01-17 ENCOUNTER — Other Ambulatory Visit: Payer: Self-pay

## 2019-01-17 VITALS — BP 131/60 | HR 76 | Temp 98.0°F | Resp 18 | Ht 68.0 in | Wt 171.0 lb

## 2019-01-17 DIAGNOSIS — Z720 Tobacco use: Secondary | ICD-10-CM

## 2019-01-17 DIAGNOSIS — C50312 Malignant neoplasm of lower-inner quadrant of left female breast: Secondary | ICD-10-CM

## 2019-01-17 DIAGNOSIS — Z923 Personal history of irradiation: Secondary | ICD-10-CM | POA: Diagnosis not present

## 2019-01-17 DIAGNOSIS — Z170 Estrogen receptor positive status [ER+]: Secondary | ICD-10-CM

## 2019-01-17 DIAGNOSIS — Z5112 Encounter for antineoplastic immunotherapy: Secondary | ICD-10-CM | POA: Diagnosis not present

## 2019-01-17 DIAGNOSIS — Z79811 Long term (current) use of aromatase inhibitors: Secondary | ICD-10-CM | POA: Diagnosis not present

## 2019-01-17 DIAGNOSIS — Z95828 Presence of other vascular implants and grafts: Secondary | ICD-10-CM

## 2019-01-17 DIAGNOSIS — Z87891 Personal history of nicotine dependence: Secondary | ICD-10-CM | POA: Diagnosis not present

## 2019-01-17 LAB — CBC WITH DIFFERENTIAL/PLATELET
Abs Immature Granulocytes: 0.04 10*3/uL (ref 0.00–0.07)
Basophils Absolute: 0.1 10*3/uL (ref 0.0–0.1)
Basophils Relative: 1 %
Eosinophils Absolute: 0.3 10*3/uL (ref 0.0–0.5)
Eosinophils Relative: 3 %
HCT: 40.2 % (ref 36.0–46.0)
Hemoglobin: 13.5 g/dL (ref 12.0–15.0)
Immature Granulocytes: 0 %
Lymphocytes Relative: 18 %
Lymphs Abs: 1.9 10*3/uL (ref 0.7–4.0)
MCH: 33.5 pg (ref 26.0–34.0)
MCHC: 33.6 g/dL (ref 30.0–36.0)
MCV: 99.8 fL (ref 80.0–100.0)
Monocytes Absolute: 0.9 10*3/uL (ref 0.1–1.0)
Monocytes Relative: 8 %
Neutro Abs: 7.7 10*3/uL (ref 1.7–7.7)
Neutrophils Relative %: 70 %
Platelets: 290 10*3/uL (ref 150–400)
RBC: 4.03 MIL/uL (ref 3.87–5.11)
RDW: 13.2 % (ref 11.5–15.5)
WBC: 10.8 10*3/uL — ABNORMAL HIGH (ref 4.0–10.5)
nRBC: 0 % (ref 0.0–0.2)

## 2019-01-17 LAB — COMPREHENSIVE METABOLIC PANEL
ALT: 20 U/L (ref 0–44)
AST: 14 U/L — ABNORMAL LOW (ref 15–41)
Albumin: 3.8 g/dL (ref 3.5–5.0)
Alkaline Phosphatase: 99 U/L (ref 38–126)
Anion gap: 9 (ref 5–15)
BUN: 15 mg/dL (ref 8–23)
CO2: 23 mmol/L (ref 22–32)
Calcium: 9.5 mg/dL (ref 8.9–10.3)
Chloride: 107 mmol/L (ref 98–111)
Creatinine, Ser: 0.85 mg/dL (ref 0.44–1.00)
GFR calc Af Amer: >60 mL/min (ref >60)
GFR calc non Af Amer: >60 mL/min (ref >60)
Glucose, Bld: 108 mg/dL — ABNORMAL HIGH (ref 70–99)
Potassium: 4.2 mmol/L (ref 3.5–5.1)
Sodium: 139 mmol/L (ref 135–145)
Total Bilirubin: 0.3 mg/dL (ref 0.3–1.2)
Total Protein: 7.1 g/dL (ref 6.5–8.1)

## 2019-01-17 MED ORDER — SODIUM CHLORIDE 0.9 % IV SOLN
Freq: Once | INTRAVENOUS | Status: AC
  Administered 2019-01-17: 11:00:00 via INTRAVENOUS
  Filled 2019-01-17: qty 250

## 2019-01-17 MED ORDER — SODIUM CHLORIDE 0.9% FLUSH
10.0000 mL | INTRAVENOUS | Status: DC | PRN
  Administered 2019-01-17: 10:00:00 10 mL
  Filled 2019-01-17: qty 10

## 2019-01-17 MED ORDER — ACETAMINOPHEN 325 MG PO TABS
ORAL_TABLET | ORAL | Status: AC
  Filled 2019-01-17: qty 2

## 2019-01-17 MED ORDER — SODIUM CHLORIDE 0.9% FLUSH
10.0000 mL | INTRAVENOUS | Status: DC | PRN
  Administered 2019-01-17: 12:00:00 10 mL
  Filled 2019-01-17: qty 10

## 2019-01-17 MED ORDER — ACETAMINOPHEN 325 MG PO TABS
650.0000 mg | ORAL_TABLET | Freq: Once | ORAL | Status: AC
  Administered 2019-01-17: 11:00:00 650 mg via ORAL

## 2019-01-17 MED ORDER — HEPARIN SOD (PORK) LOCK FLUSH 100 UNIT/ML IV SOLN
500.0000 [IU] | Freq: Once | INTRAVENOUS | Status: AC | PRN
  Administered 2019-01-17: 12:00:00 500 [IU]
  Filled 2019-01-17: qty 5

## 2019-01-17 MED ORDER — DIPHENHYDRAMINE HCL 25 MG PO CAPS
ORAL_CAPSULE | ORAL | Status: AC
  Filled 2019-01-17: qty 1

## 2019-01-17 MED ORDER — DIPHENHYDRAMINE HCL 25 MG PO CAPS
25.0000 mg | ORAL_CAPSULE | Freq: Once | ORAL | Status: AC
  Administered 2019-01-17: 25 mg via ORAL

## 2019-01-17 MED ORDER — TRASTUZUMAB-DKST CHEMO 150 MG IV SOLR
450.0000 mg | Freq: Once | INTRAVENOUS | Status: AC
  Administered 2019-01-17: 12:00:00 450 mg via INTRAVENOUS
  Filled 2019-01-17: qty 21.43

## 2019-01-17 NOTE — Patient Instructions (Signed)

## 2019-01-17 NOTE — Patient Instructions (Signed)
[facility] Discharge Instructions for Patients Receiving Chemotherapy  Today you received the following chemotherapy agents: Trastuzumab   To help prevent nausea and vomiting after your treatment, we encourage you to take your nausea medication  as prescribed.    If you develop nausea and vomiting that is not controlled by your nausea medication, call the clinic.   BELOW ARE SYMPTOMS THAT SHOULD BE REPORTED IMMEDIATELY:  *FEVER GREATER THAN 100.5 F  *CHILLS WITH OR WITHOUT FEVER  NAUSEA AND VOMITING THAT IS NOT CONTROLLED WITH YOUR NAUSEA MEDICATION  *UNUSUAL SHORTNESS OF BREATH  *UNUSUAL BRUISING OR BLEEDING  TENDERNESS IN MOUTH AND THROAT WITH OR WITHOUT PRESENCE OF ULCERS  *URINARY PROBLEMS  *BOWEL PROBLEMS  UNUSUAL RASH Items with * indicate a potential emergency and should be followed up as soon as possible.  Feel free to call the clinic should you have any questions or concerns. The clinic phone number is [phone].  Please show the CHEMO ALERT CARD at check-in to the Emergency Department and triage nurse.   

## 2019-01-31 ENCOUNTER — Ambulatory Visit
Admission: RE | Admit: 2019-01-31 | Discharge: 2019-01-31 | Disposition: A | Discharge status: Home / Self Care | Payer: Medicare Other | Source: Ambulatory Visit | Attending: General Surgery | Admitting: General Surgery

## 2019-01-31 ENCOUNTER — Other Ambulatory Visit: Payer: Self-pay

## 2019-01-31 DIAGNOSIS — N6489 Other specified disorders of breast: Secondary | ICD-10-CM | POA: Diagnosis not present

## 2019-01-31 DIAGNOSIS — R922 Inconclusive mammogram: Secondary | ICD-10-CM | POA: Diagnosis not present

## 2019-01-31 DIAGNOSIS — C50312 Malignant neoplasm of lower-inner quadrant of left female breast: Secondary | ICD-10-CM

## 2019-01-31 DIAGNOSIS — Z853 Personal history of malignant neoplasm of breast: Secondary | ICD-10-CM

## 2019-01-31 MED ORDER — GADOBUTROL 1 MMOL/ML IV SOLN
7.0000 mL | Freq: Once | INTRAVENOUS | Status: AC | PRN
  Administered 2019-01-31: 7 mL via INTRAVENOUS

## 2019-02-07 ENCOUNTER — Inpatient Hospital Stay: Payer: Medicare Other

## 2019-02-07 ENCOUNTER — Other Ambulatory Visit: Payer: Self-pay

## 2019-02-07 ENCOUNTER — Inpatient Hospital Stay: Payer: Medicare Other | Attending: Oncology

## 2019-02-07 VITALS — BP 156/80 | HR 76 | Temp 98.2°F | Resp 18 | Wt 170.0 lb

## 2019-02-07 DIAGNOSIS — Z79899 Other long term (current) drug therapy: Secondary | ICD-10-CM | POA: Insufficient documentation

## 2019-02-07 DIAGNOSIS — Z808 Family history of malignant neoplasm of other organs or systems: Secondary | ICD-10-CM | POA: Insufficient documentation

## 2019-02-07 DIAGNOSIS — G629 Polyneuropathy, unspecified: Secondary | ICD-10-CM | POA: Diagnosis not present

## 2019-02-07 DIAGNOSIS — Z5112 Encounter for antineoplastic immunotherapy: Secondary | ICD-10-CM | POA: Insufficient documentation

## 2019-02-07 DIAGNOSIS — Z170 Estrogen receptor positive status [ER+]: Secondary | ICD-10-CM | POA: Insufficient documentation

## 2019-02-07 DIAGNOSIS — Z801 Family history of malignant neoplasm of trachea, bronchus and lung: Secondary | ICD-10-CM | POA: Insufficient documentation

## 2019-02-07 DIAGNOSIS — I10 Essential (primary) hypertension: Secondary | ICD-10-CM | POA: Insufficient documentation

## 2019-02-07 DIAGNOSIS — Z923 Personal history of irradiation: Secondary | ICD-10-CM | POA: Diagnosis not present

## 2019-02-07 DIAGNOSIS — Z95828 Presence of other vascular implants and grafts: Secondary | ICD-10-CM

## 2019-02-07 DIAGNOSIS — C50312 Malignant neoplasm of lower-inner quadrant of left female breast: Secondary | ICD-10-CM

## 2019-02-07 DIAGNOSIS — Z90710 Acquired absence of both cervix and uterus: Secondary | ICD-10-CM | POA: Insufficient documentation

## 2019-02-07 DIAGNOSIS — Z8041 Family history of malignant neoplasm of ovary: Secondary | ICD-10-CM | POA: Diagnosis not present

## 2019-02-07 DIAGNOSIS — F17210 Nicotine dependence, cigarettes, uncomplicated: Secondary | ICD-10-CM | POA: Insufficient documentation

## 2019-02-07 DIAGNOSIS — Z79811 Long term (current) use of aromatase inhibitors: Secondary | ICD-10-CM | POA: Diagnosis not present

## 2019-02-07 DIAGNOSIS — Z720 Tobacco use: Secondary | ICD-10-CM

## 2019-02-07 LAB — CBC WITH DIFFERENTIAL/PLATELET
Abs Immature Granulocytes: 0.03 10*3/uL (ref 0.00–0.07)
Basophils Absolute: 0.1 10*3/uL (ref 0.0–0.1)
Basophils Relative: 1 %
Eosinophils Absolute: 0.3 10*3/uL (ref 0.0–0.5)
Eosinophils Relative: 3 %
HCT: 40.8 % (ref 36.0–46.0)
Hemoglobin: 13.7 g/dL (ref 12.0–15.0)
Immature Granulocytes: 0 %
Lymphocytes Relative: 19 %
Lymphs Abs: 1.6 10*3/uL (ref 0.7–4.0)
MCH: 33.7 pg (ref 26.0–34.0)
MCHC: 33.6 g/dL (ref 30.0–36.0)
MCV: 100.2 fL — ABNORMAL HIGH (ref 80.0–100.0)
Monocytes Absolute: 0.8 10*3/uL (ref 0.1–1.0)
Monocytes Relative: 9 %
Neutro Abs: 5.9 10*3/uL (ref 1.7–7.7)
Neutrophils Relative %: 68 %
Platelets: 300 10*3/uL (ref 150–400)
RBC: 4.07 MIL/uL (ref 3.87–5.11)
RDW: 13.7 % (ref 11.5–15.5)
WBC: 8.6 10*3/uL (ref 4.0–10.5)
nRBC: 0 % (ref 0.0–0.2)

## 2019-02-07 LAB — COMPREHENSIVE METABOLIC PANEL
ALT: 23 U/L (ref 0–44)
AST: 17 U/L (ref 15–41)
Albumin: 3.8 g/dL (ref 3.5–5.0)
Alkaline Phosphatase: 99 U/L (ref 38–126)
Anion gap: 10 (ref 5–15)
BUN: 11 mg/dL (ref 8–23)
CO2: 23 mmol/L (ref 22–32)
Calcium: 9.5 mg/dL (ref 8.9–10.3)
Chloride: 107 mmol/L (ref 98–111)
Creatinine, Ser: 0.76 mg/dL (ref 0.44–1.00)
GFR calc Af Amer: >60 mL/min (ref >60)
GFR calc non Af Amer: >60 mL/min (ref >60)
Glucose, Bld: 98 mg/dL (ref 70–99)
Potassium: 4.3 mmol/L (ref 3.5–5.1)
Sodium: 140 mmol/L (ref 135–145)
Total Bilirubin: 0.4 mg/dL (ref 0.3–1.2)
Total Protein: 7.1 g/dL (ref 6.5–8.1)

## 2019-02-07 MED ORDER — SODIUM CHLORIDE 0.9% FLUSH
10.0000 mL | INTRAVENOUS | Status: DC | PRN
  Administered 2019-02-07: 10:00:00 10 mL
  Filled 2019-02-07: qty 10

## 2019-02-07 MED ORDER — HEPARIN SOD (PORK) LOCK FLUSH 100 UNIT/ML IV SOLN
500.0000 [IU] | Freq: Once | INTRAVENOUS | Status: AC | PRN
  Administered 2019-02-07: 12:00:00 500 [IU]
  Filled 2019-02-07: qty 5

## 2019-02-07 MED ORDER — SODIUM CHLORIDE 0.9 % IV SOLN
Freq: Once | INTRAVENOUS | Status: AC
  Administered 2019-02-07: 10:00:00 via INTRAVENOUS
  Filled 2019-02-07: qty 250

## 2019-02-07 MED ORDER — ACETAMINOPHEN 325 MG PO TABS
ORAL_TABLET | ORAL | Status: AC
  Filled 2019-02-07: qty 2

## 2019-02-07 MED ORDER — DIPHENHYDRAMINE HCL 25 MG PO CAPS
25.0000 mg | ORAL_CAPSULE | Freq: Once | ORAL | Status: AC
  Administered 2019-02-07: 25 mg via ORAL

## 2019-02-07 MED ORDER — DIPHENHYDRAMINE HCL 25 MG PO CAPS
ORAL_CAPSULE | ORAL | Status: AC
  Filled 2019-02-07: qty 1

## 2019-02-07 MED ORDER — SODIUM CHLORIDE 0.9% FLUSH
10.0000 mL | INTRAVENOUS | Status: DC | PRN
  Administered 2019-02-07: 10 mL
  Filled 2019-02-07: qty 10

## 2019-02-07 MED ORDER — TRASTUZUMAB-DKST CHEMO 150 MG IV SOLR
450.0000 mg | Freq: Once | INTRAVENOUS | Status: AC
  Administered 2019-02-07: 450 mg via INTRAVENOUS
  Filled 2019-02-07: qty 21.43

## 2019-02-07 MED ORDER — ACETAMINOPHEN 325 MG PO TABS
650.0000 mg | ORAL_TABLET | Freq: Once | ORAL | Status: AC
  Administered 2019-02-07: 650 mg via ORAL

## 2019-02-07 NOTE — Patient Instructions (Signed)

## 2019-02-07 NOTE — Patient Instructions (Signed)
[facility] Discharge Instructions for Patients Receiving Chemotherapy  Today you received the following chemotherapy agents trastuzumab.  To help prevent nausea and vomiting after your treatment, we encourage you to take your nausea medication as directed.    If you develop nausea and vomiting that is not controlled by your nausea medication, call the clinic.   BELOW ARE SYMPTOMS THAT SHOULD BE REPORTED IMMEDIATELY:  *FEVER GREATER THAN 100.5 F  *CHILLS WITH OR WITHOUT FEVER  NAUSEA AND VOMITING THAT IS NOT CONTROLLED WITH YOUR NAUSEA MEDICATION  *UNUSUAL SHORTNESS OF BREATH  *UNUSUAL BRUISING OR BLEEDING  TENDERNESS IN MOUTH AND THROAT WITH OR WITHOUT PRESENCE OF ULCERS  *URINARY PROBLEMS  *BOWEL PROBLEMS  UNUSUAL RASH Items with * indicate a potential emergency and should be followed up as soon as possible.  Feel free to call the clinic should you have any questions or concerns. The clinic phone number is [phone].  Please show the CHEMO ALERT CARD at check-in to the Emergency Department and triage nurse.   

## 2019-02-11 ENCOUNTER — Other Ambulatory Visit: Payer: Self-pay | Admitting: Oncology

## 2019-02-14 ENCOUNTER — Other Ambulatory Visit: Payer: Self-pay | Admitting: Oncology

## 2019-02-14 ENCOUNTER — Other Ambulatory Visit: Payer: Self-pay | Admitting: General Surgery

## 2019-02-19 DIAGNOSIS — E890 Postprocedural hypothyroidism: Secondary | ICD-10-CM | POA: Diagnosis not present

## 2019-02-19 DIAGNOSIS — C50312 Malignant neoplasm of lower-inner quadrant of left female breast: Secondary | ICD-10-CM | POA: Diagnosis not present

## 2019-02-19 DIAGNOSIS — E0590 Thyrotoxicosis, unspecified without thyrotoxic crisis or storm: Secondary | ICD-10-CM | POA: Diagnosis not present

## 2019-02-24 NOTE — Progress Notes (Signed)
[place] [facility]  [phone] [phone]     ID: Margaret Hodges DOB: 03-09-1946  MR#: [id number]  HGD#:[id number]  Patient Care Team: [name], MD as PCP - General (Family Medicine) Fanny Skates, MD as Consulting Physician (General Surgery) Justen Fonda, Virgie Dad, MD as Consulting Physician (Oncology) Kyung Rudd, MD as Consulting Physician (Radiation Oncology) Allyn Kenner, MD (Dermatology) Larey Dresser, MD as Consulting Physician (Cardiology) Bensimhon, Shaune Pascal, MD as Consulting Physician (Cardiology) OTHER MD:   CHIEF COMPLAINT: Estrogen receptor positive breast cancer  CURRENT TREATMENT: Completing 1 year of trastuzumab; continuing on anastrozole   INTERVAL HISTORY: Margaret Hodges returns today for follow-up and treatment of her estrogen receptor positive breast cancer.    She continues on trastuzumab.  Today will be her final dose. Since her last visit, she underwent repeat echocardiogram on 12/16/2018, which showed an ejection fraction of 60-65%.  She has had excellent cardiac function throughout and will not need any further echoes.  She also continues on anastrozole.  She was having hot flashes before she started and they did not get worse.  Vaginal dryness is not a major issue.  She also underwent bilateral diagnostic mammography with tomography at [facility] on 01/31/2019 showing: breast density category C; no evidence of malignancy in either breast.  There had been some concern there possibly was a second mass noted at the time of surgery, so she also underwent breast MRI the same day, which showed: breast composition B; no evidence of malignancy in either breast.  She is scheduled for port removal on 03/17/2019.   REVIEW OF SYSTEMS: [name] is taking appropriate pandemic precautions.  During Thanksgiving's her children visited but stayed out in the porch, wore masks and distanced.  She continues to care for her husband who has supra  nuclear palsy and is becoming progressively weaker.  She manages to take a walk around the block and do a little bit of yard work.  Aside from these issues a detailed review of systems today was stable   HISTORY OF CURRENT ILLNESS: From the original intake note:  Margaret Hodges had routine screening mammography on 01/03/2018 showing a possible abnormality in the left breast. She underwent bilateral diagnostic mammography with tomography and left breast ultrasonography at [facility] on 01/15/2018 showing: Breast density category B; a 1.3 cm round solid mass with an indistinct margin in the left breast at 7 o'clock posterior 8 cm from the nipple. No significant abnormalities found in the left axilla.  Accordingly on 01/16/2018 she proceeded to biopsy of the left breast area in question. The pathology from this procedure showed [id number]): Invasive ductal carcinoma, grade III. Prognostic indicators significant for: estrogen receptor, 60% positive, with weak staining intensity; progesterone receptor negative, Proliferation marker Ki67 at 80%. HER2 positive by immunohistochemistry, 3+.  The patient's subsequent history is as detailed below.   PAST MEDICAL HISTORY: Past Medical History:  Diagnosis Date  . Anginal pain ([place])    with admission to hospital due to high blood pressure  . Complication of anesthesia   . Family history of ovarian cancer   . GERD (gastroesophageal reflux disease)   . Guillain Barr syndrome ([place]) 1959  . Guillain Barr syndrome ([place]) 1959  . Guillain Barr syndrome ([place]) 1959  . Headache    sinus headaches  . History of hiatal hernia    was told pt. had one, but no testing  . Hypertensive urgency 04/05/2014  . Malignant neoplasm of lower-inner quadrant of left breast in  female, estrogen receptor positive ([place]) 01/24/2018  . Neuromuscular disorder ([place])    Guillian- Barre Syndrome  . Pneumonia    hx. of walking pneumonia  . PONV (postoperative nausea and vomiting)      PAST SURGICAL HISTORY: Past Surgical History:  Procedure Laterality Date  . ABDOMINAL HYSTERECTOMY     Total  . Bladder lift  15 years ago,  Uterus prolapse causing intestinal damage and surgery    . BREAST LUMPECTOMY Left 02/18/2018  . BREAST LUMPECTOMY WITH RADIOACTIVE SEED AND SENTINEL LYMPH NODE BIOPSY Left 02/18/2018   Procedure: LEFT BREAST LUMPECTOMY WITH RADIOACTIVE SEED AND LEFT AXILLARY DEEP SENTINEL LYMPH NODE BIOPSY, INJECT BLUE DYE LEFT BREAST;  Surgeon: Fanny Skates, MD;  Location: [facility];  Service: General;  Laterality: Left;  . PORTACATH PLACEMENT Right 02/18/2018   Procedure: INSERTION PORT-A-CATH;  Surgeon: Fanny Skates, MD;  Location: [facility];  Service: General;  Laterality: Right;  . Right torn meniscus repair x 2    . THYROID LOBECTOMY N/A 02/04/2015   Procedure: TOTAL THYROIDECTOMY;  Surgeon: Armandina Gemma, MD;  Location: [facility];  Service: General;  Laterality: N/A;  . TONSILLECTOMY    . TUMOR REMOVAL    . Tumors on arms removed,  Tumor on gum removed,  Tumor removed from inside ear      FAMILY HISTORY Family History  Problem Relation Age of Onset  . Ovarian cancer Sister   . Lung cancer Sister   . Throat cancer Brother   . Brain cancer Sister    She notes that her father died from old age at age 60. Patients' mother died from multiple comorbidities at age 85. The patient has 5 brothers and 6 sisters. Patients' sister had ovarian cancer at age 24 and lung cancer at 75, brother with throat cancer at age 56, and sister with brain cancer at age 36.    GYNECOLOGIC HISTORY:  No LMP recorded. Patient has had a hysterectomy. 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 Her husband was a Librarian, academic at  [employer].  He now has significant medical issues (supranuclear palsy) and the patient is his caregiver. Her son, Margaret Hodges, manages a call center for medical supplies. Her daughter, [name], is Patient Care surgical coordinator at [employer] clinic.  The patient has 2 grandchildren. She attends [employer].    ADVANCED DIRECTIVES: Her [place]. [place] is her daughter, [name] and can be reached at [phone].     HEALTH MAINTENANCE: Social History   Tobacco Use  . Smoking status: Current Every Day Smoker    Packs/day: 1.50    Types: Cigarettes  . Smokeless tobacco: Never Used  Substance Use Topics  . Alcohol use: No  . Drug use: No     Colonoscopy: Yes through [facility]  PAP:   Bone density:    Allergies  Allergen Reactions  . Allegra [Fexofenadine] Hives  . Clindamycin/Lincomycin Hives and Diarrhea  . Influenza A (H1n1) Monoval Vac Other (See Comments)    Guillian Barre Syndrome  . Keflex [Cephalexin] Hives  . Penicillins Swelling    Has patient had a PCN reaction causing immediate rash, facial/tongue/throat swelling, SOB or lightheadedness with hypotension: No Has patient had a PCN reaction causing severe rash  involving mucus membranes or skin necrosis: No Has patient had a PCN reaction that required hospitalization No Has patient had a PCN reaction occurring within the last 10 years: No If all of the above answers are "NO", then may proceed with Cephalosporin use.   . Prednisone Other (See Comments)    "heart attack symptoms" but even had heart cath & had no cardiac disease  . Protonix [Pantoprazole Sodium] Diarrhea    Tolerates Prilosec  . Statins Other (See Comments)    myalgias    Current Outpatient Medications  Medication Sig Dispense Refill  . amLODipine (NORVASC) 5 MG tablet Take 1 tablet (5 mg total) by mouth daily. 30 tablet 0  . anastrozole (ARIMIDEX) 1 MG tablet Take 1 tablet (1 mg total) by mouth daily. 90 tablet 4  .  Cholecalciferol (VITAMIN D) 2000 UNITS tablet Take 2,000 Units by mouth daily.    . clobetasol cream (TEMOVATE) 0.05 % APPLY TO AFFECTED AREA TWICE A DAY 30 g 0  . diclofenac sodium (VOLTAREN) 1 % GEL Apply 4 g topically 4 (four) times daily as needed (arthritis pain).     . hyoscyamine (LEVBID) 0.375 MG 12 hr tablet Take 0.375 mg by mouth every 12 (twelve) hours as needed (blaader spasms).    Marland Kitchen ketoconazole (NIZORAL) 2 % cream Apply 1 application topically daily. 15 g 0  . levothyroxine (SYNTHROID, LEVOTHROID) 100 MCG tablet Take 100 mcg by mouth daily.    Marland Kitchen omeprazole (PRILOSEC) 40 MG capsule TAKE 1 CAPSULE EVERY DAY 90 capsule 3  . prochlorperazine (COMPAZINE) 10 MG tablet TAKE 1 TABLET EVERY 6 HOUR AS NEEDED FOR NAUSEA    . saccharomyces boulardii (FLORASTOR) 250 MG capsule Take 250 mg by mouth daily as needed (only when on antibiotic).     No current facility-administered medications for this visit.     OBJECTIVE: Middle-aged white woman in no acute distress Vitals:   02/25/19 0940  BP: 135/69  Pulse: 69  Resp: 18  Temp: (!) 97.4 F (36.3 C)  SpO2: 99%     Body mass index is 25.88 kg/m.   Wt Readings from Last 3 Encounters:  02/25/19 170 lb 3.2 oz (77.2 kg)  02/07/19 170 lb (77.1 kg)  01/17/19 171 lb (77.6 kg)      ECOG FS:1 - Symptomatic but completely ambulatory  Sclerae unicteric, EOMs intact Wearing a mask No cervical or supraclavicular adenopathy Lungs no rales or rhonchi Heart regular rate and rhythm Abd soft, nontender, positive bowel sounds MSK no focal spinal tenderness, no upper extremity lymphedema Neuro: nonfocal, well oriented, appropriate affect Breasts: The right breast is benign.  The left breast is status post lumpectomy and radiation with no evidence of disease recurrence.  Both axillae are benign.  LAB RESULTS:  CMP     Component Value Date/Time   NA 141 02/25/2019 0930   K 4.5 02/25/2019 0930   CL 108 02/25/2019 0930   CO2 23 02/25/2019 0930    GLUCOSE 101 (H) 02/25/2019 0930   BUN 14 02/25/2019 0930   CREATININE 0.80 02/25/2019 0930   CREATININE 0.82 08/23/2018 0828   CALCIUM 9.5 02/25/2019 0930   PROT 7.2 02/25/2019 0930   ALBUMIN 3.9 02/25/2019 0930   AST 15 02/25/2019 0930   AST 15 08/23/2018 0828   ALT 20 02/25/2019 0930   ALT 21 08/23/2018 0828   ALKPHOS 93 02/25/2019 0930   BILITOT 0.3 02/25/2019 0930   BILITOT 0.3 08/23/2018 0828   GFRNONAA >60 02/25/2019 0930  GFRNONAA >60 08/23/2018 0828   GFRAA >60 02/25/2019 0930   GFRAA >60 08/23/2018 0828    Lab Results  Component Value Date   TOTALPROTELP 7.5 05/08/2017    No results found for: Nils Pyle, [facility]  Lab Results  Component Value Date   WBC 7.7 02/25/2019   NEUTROABS 4.8 02/25/2019   HGB 13.8 02/25/2019   HCT 41.4 02/25/2019   MCV 100.0 02/25/2019   PLT 307 02/25/2019    _0 @  No results found for: LABCA2  No components found for: YVOPFY924  No results for input(s): INR in the last 168 hours.  No results found for: LABCA2  No results found for: MQK863  No results found for: OTR711  No results found for: AFB903  No results found for: CA2729  No components found for: HGQUANT  No results found for: CEA1 / No results found for: CEA1   No results found for: AFPTUMOR  No results found for: CHROMOGRNA  No results found for: PSA1  Appointment on 02/25/2019  Component Date Value Ref Range Status  . Sodium 02/25/2019 141  135 - 145 mmol/L Final  . Potassium 02/25/2019 4.5  3.5 - 5.1 mmol/L Final  . Chloride 02/25/2019 108  98 - 111 mmol/L Final  . CO2 02/25/2019 23  22 - 32 mmol/L Final  . Glucose, Bld 02/25/2019 101* 70 - 99 mg/dL Final  . BUN 02/25/2019 14  8 - 23 mg/dL Final  . Creatinine, Ser 02/25/2019 0.80  0.44 - 1.00 mg/dL Final  . Calcium 02/25/2019 9.5  8.9 - 10.3 mg/dL Final  . Total Protein 02/25/2019 7.2  6.5 - 8.1 g/dL Final  . Albumin 02/25/2019 3.9  3.5 - 5.0 g/dL Final  . AST  02/25/2019 15  15 - 41 U/L Final  . ALT 02/25/2019 20  0 - 44 U/L Final  . Alkaline Phosphatase 02/25/2019 93  38 - 126 U/L Final  . Total Bilirubin 02/25/2019 0.3  0.3 - 1.2 mg/dL Final  . GFR calc non Af Amer 02/25/2019 >60  >60 mL/min Final  . GFR calc Af Amer 02/25/2019 >60  >60 mL/min Final  . Anion gap 02/25/2019 10  5 - 15 Final   Performed at [facility], [place] [address] [place], [place] [address]  . WBC 02/25/2019 7.7  4.0 - 10.5 K/uL Final  . RBC 02/25/2019 4.14  3.87 - 5.11 MIL/uL Final  . Hemoglobin 02/25/2019 13.8  12.0 - 15.0 g/dL Final  . HCT 02/25/2019 41.4  36.0 - 46.0 % Final  . MCV 02/25/2019 100.0  80.0 - 100.0 fL Final  . MCH 02/25/2019 33.3  26.0 - 34.0 pg Final  . MCHC 02/25/2019 33.3  30.0 - 36.0 g/dL Final  . RDW 02/25/2019 13.8  11.5 - 15.5 % Final  . Platelets 02/25/2019 307  150 - 400 K/uL Final  . nRBC 02/25/2019 0.0  0.0 - 0.2 % Final  . Neutrophils Relative % 02/25/2019 62  % Final  . Neutro Abs 02/25/2019 4.8  1.7 - 7.7 K/uL Final  . Lymphocytes Relative 02/25/2019 23  % Final  . Lymphs Abs 02/25/2019 1.8  0.7 - 4.0 K/uL Final  . Monocytes Relative 02/25/2019 10  % Final  . Monocytes Absolute 02/25/2019 0.8  0.1 - 1.0 K/uL Final  . Eosinophils Relative 02/25/2019 4  % Final  . Eosinophils Absolute 02/25/2019 0.3  0.0 - 0.5 K/uL Final  . Basophils Relative 02/25/2019 1  % Final  . Basophils  Absolute 02/25/2019 0.0  0.0 - 0.1 K/uL Final  . Immature Granulocytes 02/25/2019 0  % Final  . Abs Immature Granulocytes 02/25/2019 0.02  0.00 - 0.07 K/uL Final   Performed at [facility], [place] [address] [place], [place] [address]    (this displays the last labs from the last 3 days)  Lab Results  Component Value Date   TOTALPROTELP 7.5 05/08/2017   (this displays SPEP labs)  No results found for: KPAFRELGTCHN, LAMBDASER, KAPLAMBRATIO (kappa/lambda light chains)  No results found for: HGBA, HGBA2QUANT,  HGBFQUANT, HGBSQUAN (Hemoglobinopathy evaluation)   Lab Results  Component Value Date   LDH 171 05/08/2017    No results found for: IRON, TIBC, IRONPCTSAT (Iron and TIBC)  No results found for: FERRITIN  Urinalysis    Component Value Date/Time   COLORURINE YELLOW 04/04/2014 2112   APPEARANCEUR CLEAR 04/04/2014 2112   LABSPEC 1.017 04/04/2014 2112   PHURINE 6.0 04/04/2014 2112   GLUCOSEU NEGATIVE 04/04/2014 2112   HGBUR NEGATIVE 04/04/2014 2112   BILIRUBINUR NEGATIVE 04/04/2014 2112   KETONESUR NEGATIVE 04/04/2014 2112   PROTEINUR NEGATIVE 04/04/2014 2112   UROBILINOGEN 0.2 04/04/2014 2112   NITRITE NEGATIVE 04/04/2014 2112   LEUKOCYTESUR NEGATIVE 04/04/2014 2112     STUDIES:  Mr Breast Bilateral W Wo Contrast Inc Cad  Result Date: 02/03/2019 CLINICAL DATA:  73 year old female status post treated right breast cancer with lumpectomy in 2019 and radiation completed in 2020. Patient is currently undergoing chemotherapy. LABS:  None performed today and site. EXAM: BILATERAL BREAST MRI WITH AND WITHOUT CONTRAST TECHNIQUE: Multiplanar, multisequence MR images of both breasts were obtained prior to and following the intravenous administration of 7 ml of Gadavist. Three-dimensional MR images were rendered by post-processing of the original MR data on an independent workstation. The three-dimensional MR images were interpreted, and findings are reported in the following complete MRI report for this study. Three dimensional images were evaluated at the independent DynaCad workstation COMPARISON:  Previous exam(s). FINDINGS: Breast composition: b. Scattered fibroglandular tissue. Background parenchymal enhancement: Mild. Right breast: No mass or abnormal enhancement. Left breast: Postoperative and post radiation changes are noted in the lower inner left breast. Otherwise, no mass or abnormal enhancement. Lymph nodes: No abnormal appearing lymph nodes. Ancillary findings:  None. IMPRESSION: 1.  No MRI evidence of malignancy in either breast. 2. Posttreatment changes of the left breast. RECOMMENDATION: Per clinical treatment plan. BI-RADS CATEGORY  2: Benign. Electronically Signed   By: Kristopher Oppenheim M.D.   On: 02/03/2019 10:19   Mm Diag Breast Tomo Bilateral  Result Date: 01/31/2019 CLINICAL DATA:  History of left breast cancer status post lumpectomy in November of 2019. EXAM: DIGITAL DIAGNOSTIC BILATERAL MAMMOGRAM WITH CAD AND TOMO COMPARISON:  Previous exam(s). ACR Breast Density Category c: The breast tissue is heterogeneously dense, which may obscure small masses. FINDINGS: Lumpectomy changes are seen in the left breast. No suspicious mass or malignant type microcalcifications identified in either breast. Mammographic images were processed with CAD. IMPRESSION: No evidence of malignancy in either breast. RECOMMENDATION: Bilateral diagnostic mammogram in 1 year is recommended. I have discussed the findings and recommendations with the patient. If applicable, a reminder letter will be sent to the patient regarding the next appointment. BI-RADS CATEGORY  2: Benign. Electronically Signed   By: Lillia Mountain M.D.   On: 01/31/2019 11:12      ELIGIBLE FOR AVAILABLE RESEARCH PROTOCOL: no   ASSESSMENT: 73 y.o. [place] woman status post left breast lower inner  quadrant biopsy 01/16/2018 for a clinical T1c N0, stage IA invasive ductal carcinoma, grade 3, estrogen receptor weakly positive, progesterone receptor negative, with an MIB-1 of 80%, and HER-2 amplified  (1) genetics 03/06/2018 showed no deleterious mutations  (2) status post left lumpectomy and sentinel lymph node sampling 02/18/2018 for a pT2 pN0, stage IIA invasive ductal carcinoma, grade 3, with negative margins.  (a) a total of 5 lymph nodes were removed   (3) adjuvant chemo immunotherapy consisting of paclitaxel/trastuzumab weekly for 12 weeks, started 03/08/2018.  (a) paclitaxel discontinued after 6 doses because of peripheral  neuropathy; last dose 04/12/2018  (4) trastuzumab continued to complete a year (last dose 02/25/2019)  (a) echocardiogram 02/06/2018 shows an ejection fraction in the 60-65% range  (b) echocardiogram 06/06/2018 shows an ejection fraction in the 60-65% range  (c) echocardiogram 09/09/2018 shows an ejection fraction in the 60-65% range  (d) echocardiogram 12/16/2018 shows an ejection fraction in the 60-65% range  (5) adjuvant radiation 05/30/2018 - 06/26/2018  (a) Left Breast / 42.56 Gy in 16 fractions  (b) Boost / 8 Gy in 4 fractions  (6) anastrozole started June 2020  (a) bone density at [facility] 12/27/2017 shows a T score of -1.7  (7) tobacco abuse disorder: Ka[place]lyn stopped smoking 06/26/2018   PLAN: Terah completes her year of trastuzumab today.  She has done remarkably well with this.  She will have her port removed later this month.  The breast MRI was very reassuring.  This will not need to be repeated in the future.  She is tolerating the anastrozole well and the plan will be to continue that a total of 5 years.  She has a moderate exercise program.  Once the pandemic is over she will go back to the gym and exercise more regularly.  She tells me she will not be able to receive the vaccine because of concerns regarding Guyon Barr.  She normally sees Dr. Cheron Schaumann her primary care physician in February.  Accordingly I am going to see her again in August.  That way she will have a breast exam every 6 months.  She will have her next mammography in October 2021 and she will have a repeat bone density at that time  She knows to call for any other issue that may develop before her next visit here.  Edmar Blankenburg, Virgie Dad, MD  02/25/19 10:06 AM Medical Oncology and Hematology [facility] [address] [place], [place] [address] Tel. [phone]    Fax. [phone]   I, Wilburn Mylar, am acting as scribe for Dr. Virgie Dad. Phyllis Abelson.  I, Lurline Del MD,  have reviewed the above documentation for accuracy and completeness, and I agree with the above.

## 2019-02-25 ENCOUNTER — Inpatient Hospital Stay (HOSPITAL_BASED_OUTPATIENT_CLINIC_OR_DEPARTMENT_OTHER): Payer: Medicare Other | Admitting: Oncology

## 2019-02-25 ENCOUNTER — Inpatient Hospital Stay: Payer: Medicare Other

## 2019-02-25 ENCOUNTER — Encounter: Payer: Self-pay | Admitting: *Deleted

## 2019-02-25 ENCOUNTER — Other Ambulatory Visit: Payer: Self-pay

## 2019-02-25 ENCOUNTER — Inpatient Hospital Stay: Payer: Medicare Other | Attending: Oncology

## 2019-02-25 VITALS — BP 135/69 | HR 69 | Temp 97.4°F | Resp 18 | Ht 68.0 in | Wt 170.2 lb

## 2019-02-25 DIAGNOSIS — Z5112 Encounter for antineoplastic immunotherapy: Secondary | ICD-10-CM | POA: Diagnosis not present

## 2019-02-25 DIAGNOSIS — Z79811 Long term (current) use of aromatase inhibitors: Secondary | ICD-10-CM | POA: Insufficient documentation

## 2019-02-25 DIAGNOSIS — Z79899 Other long term (current) drug therapy: Secondary | ICD-10-CM | POA: Insufficient documentation

## 2019-02-25 DIAGNOSIS — Z170 Estrogen receptor positive status [ER+]: Secondary | ICD-10-CM | POA: Diagnosis not present

## 2019-02-25 DIAGNOSIS — C50312 Malignant neoplasm of lower-inner quadrant of left female breast: Secondary | ICD-10-CM

## 2019-02-25 DIAGNOSIS — Z87891 Personal history of nicotine dependence: Secondary | ICD-10-CM | POA: Insufficient documentation

## 2019-02-25 DIAGNOSIS — Z720 Tobacco use: Secondary | ICD-10-CM

## 2019-02-25 DIAGNOSIS — Z95828 Presence of other vascular implants and grafts: Secondary | ICD-10-CM

## 2019-02-25 LAB — CBC WITH DIFFERENTIAL/PLATELET
Abs Immature Granulocytes: 0.02 10*3/uL (ref 0.00–0.07)
Basophils Absolute: 0 10*3/uL (ref 0.0–0.1)
Basophils Relative: 1 %
Eosinophils Absolute: 0.3 10*3/uL (ref 0.0–0.5)
Eosinophils Relative: 4 %
HCT: 41.4 % (ref 36.0–46.0)
Hemoglobin: 13.8 g/dL (ref 12.0–15.0)
Immature Granulocytes: 0 %
Lymphocytes Relative: 23 %
Lymphs Abs: 1.8 10*3/uL (ref 0.7–4.0)
MCH: 33.3 pg (ref 26.0–34.0)
MCHC: 33.3 g/dL (ref 30.0–36.0)
MCV: 100 fL (ref 80.0–100.0)
Monocytes Absolute: 0.8 10*3/uL (ref 0.1–1.0)
Monocytes Relative: 10 %
Neutro Abs: 4.8 10*3/uL (ref 1.7–7.7)
Neutrophils Relative %: 62 %
Platelets: 307 10*3/uL (ref 150–400)
RBC: 4.14 MIL/uL (ref 3.87–5.11)
RDW: 13.8 % (ref 11.5–15.5)
WBC: 7.7 10*3/uL (ref 4.0–10.5)
nRBC: 0 % (ref 0.0–0.2)

## 2019-02-25 LAB — COMPREHENSIVE METABOLIC PANEL
ALT: 20 U/L (ref 0–44)
AST: 15 U/L (ref 15–41)
Albumin: 3.9 g/dL (ref 3.5–5.0)
Alkaline Phosphatase: 93 U/L (ref 38–126)
Anion gap: 10 (ref 5–15)
BUN: 14 mg/dL (ref 8–23)
CO2: 23 mmol/L (ref 22–32)
Calcium: 9.5 mg/dL (ref 8.9–10.3)
Chloride: 108 mmol/L (ref 98–111)
Creatinine, Ser: 0.8 mg/dL (ref 0.44–1.00)
GFR calc Af Amer: >60 mL/min (ref >60)
GFR calc non Af Amer: >60 mL/min (ref >60)
Glucose, Bld: 101 mg/dL — ABNORMAL HIGH (ref 70–99)
Potassium: 4.5 mmol/L (ref 3.5–5.1)
Sodium: 141 mmol/L (ref 135–145)
Total Bilirubin: 0.3 mg/dL (ref 0.3–1.2)
Total Protein: 7.2 g/dL (ref 6.5–8.1)

## 2019-02-25 MED ORDER — ACETAMINOPHEN 325 MG PO TABS
ORAL_TABLET | ORAL | Status: AC
  Filled 2019-02-25: qty 2

## 2019-02-25 MED ORDER — SODIUM CHLORIDE 0.9% FLUSH
10.0000 mL | INTRAVENOUS | Status: DC | PRN
  Administered 2019-02-25: 10 mL
  Filled 2019-02-25: qty 10

## 2019-02-25 MED ORDER — ACETAMINOPHEN 325 MG PO TABS
650.0000 mg | ORAL_TABLET | Freq: Once | ORAL | Status: AC
  Administered 2019-02-25: 650 mg via ORAL

## 2019-02-25 MED ORDER — HEPARIN SOD (PORK) LOCK FLUSH 100 UNIT/ML IV SOLN
500.0000 [IU] | Freq: Once | INTRAVENOUS | Status: AC | PRN
  Administered 2019-02-25: 500 [IU]
  Filled 2019-02-25: qty 5

## 2019-02-25 MED ORDER — SODIUM CHLORIDE 0.9 % IV SOLN
Freq: Once | INTRAVENOUS | Status: AC
  Administered 2019-02-25: 10:00:00 via INTRAVENOUS
  Filled 2019-02-25: qty 250

## 2019-02-25 MED ORDER — TRASTUZUMAB-DKST CHEMO 150 MG IV SOLR
450.0000 mg | Freq: Once | INTRAVENOUS | Status: AC
  Administered 2019-02-25: 450 mg via INTRAVENOUS
  Filled 2019-02-25: qty 21.43

## 2019-02-25 MED ORDER — DIPHENHYDRAMINE HCL 25 MG PO CAPS
ORAL_CAPSULE | ORAL | Status: AC
  Filled 2019-02-25: qty 1

## 2019-02-25 MED ORDER — DIPHENHYDRAMINE HCL 25 MG PO CAPS
25.0000 mg | ORAL_CAPSULE | Freq: Once | ORAL | Status: AC
  Administered 2019-02-25: 25 mg via ORAL

## 2019-02-25 MED ORDER — KETOCONAZOLE 2 % EX CREA: 1.0000 "application " | TOPICAL_CREAM | Freq: Every day | CUTANEOUS | 0 refills | Status: DC

## 2019-02-25 NOTE — Patient Instructions (Signed)
[facility] Discharge Instructions for Patients Receiving Chemotherapy  Today you received the following chemotherapy agents: Herceptin   To help prevent nausea and vomiting after your treatment, we encourage you to take your nausea medication as directed.    If you develop nausea and vomiting that is not controlled by your nausea medication, call the clinic.   BELOW ARE SYMPTOMS THAT SHOULD BE REPORTED IMMEDIATELY:  *FEVER GREATER THAN 100.5 F  *CHILLS WITH OR WITHOUT FEVER  NAUSEA AND VOMITING THAT IS NOT CONTROLLED WITH YOUR NAUSEA MEDICATION  *UNUSUAL SHORTNESS OF BREATH  *UNUSUAL BRUISING OR BLEEDING  TENDERNESS IN MOUTH AND THROAT WITH OR WITHOUT PRESENCE OF ULCERS  *URINARY PROBLEMS  *BOWEL PROBLEMS  UNUSUAL RASH Items with * indicate a potential emergency and should be followed up as soon as possible.  Feel free to call the clinic you have any questions or concerns. The clinic phone number is [phone].  Please show the CHEMO ALERT CARD at check-in to the Emergency Department and triage nurse.   

## 2019-02-25 NOTE — Patient Instructions (Signed)

## 2019-02-26 ENCOUNTER — Telehealth: Payer: Self-pay | Admitting: Oncology

## 2019-02-26 NOTE — Telephone Encounter (Signed)
I talk with patient regarding schedule  

## 2019-02-27 ENCOUNTER — Telehealth: Payer: Self-pay | Admitting: Oncology

## 2019-02-27 NOTE — Telephone Encounter (Signed)
Confirmed March visit with patient. Patient request in person visit. Cannot do virtual visits.

## 2019-03-11 ENCOUNTER — Encounter (HOSPITAL_BASED_OUTPATIENT_CLINIC_OR_DEPARTMENT_OTHER): Payer: Self-pay | Admitting: General Surgery

## 2019-03-11 ENCOUNTER — Other Ambulatory Visit: Payer: Self-pay

## 2019-03-12 NOTE — H&P (View-Only) (Signed)

## 2019-03-12 NOTE — H&P (Signed)
Margaret Hodges Location: [facility] Surgery Patient #: [id number] DOB: December 20, 1945 Married / Language: English / Race: White Female      History of Present Illness   This is a very pleasant 73 year old female returns for long-term follow-up regarding her left breast cancer. Dr. Jana Hakim and Dr. Lisbeth Renshaw are involved in her care. [name] is her PCP. Livia Snellen was my chaperone.  On February 18, 2018 she underwent Port-A-Cath insertion, left breast lumpectomy in the lower inner quadrant sentinel node biopsy. She had a very medial tumor and so I performed a transverse elliptical incision to get all of this out. Surprisingly, we found 2 tumors 2.7 cm invasive adenocarcinoma and a 1.3 cm invasive ductal carcinoma. Fortunately, we had excellent margins. All 5 sentinel nodes are negative. Estrogen receptor positive but weak. HER-2 positive. Ki-67 80% Genetics are negative radiation therapy was completed on June 26, 2018 She will complete Herceptin chemotherapy in December and she is scheduled for me to remove her Port-A-Cath on March 17, 2019. She is looking forward to that being done  Because she had unexpected multifocal cancer in her specimen we felt it best to have enhanced screening at the one-year point. Mammograms and MRI were performed in early November, 2020. They both look good. Since her genetics are negative I think she can be followed annually with diagnostic mammograms, assuming that Dr. Jana Hakim agrees.  She knows that I will retire in January. We discussed this at length and both of [place] think it would be advisable for her to be followed by breast cancer surgeon. Her surgical care will be transitioned to Dr. Erroll Luna.  Continue anastrozole Port-A-Cath removal scheduled for December 21 She will see Dr. Jana Hakim on December 1 Repeat bilateral diagnostic mammograms in November, 2021 see Dr. Brantley Stage in 1 year after mammograms are  done.    Allergies Allegra *ANTIHISTAMINES*  Amoxapine *ANTIDEPRESSANTS*  Clindamycin HCl *Anti-infective Agents - Misc.**  Keflex *CEPHALOSPORINS*  PredniSONE *CORTICOSTEROIDS*  Penicillin V *PENICILLINS*  Statins Depletion *DIETARY PRODUCTS/DIETARY MANAGEMENT PRODUCTS*  SulfADIAZINE *SULFONAMIDES*  Allergies Reconciled   Medication History  Voltaren (1% Gel, External) Active. Vitamin D (Ergocalciferol) (50000UNIT Capsule, Oral) Active. AmLODIPine Besylate (5MG Tablet, Oral) Active. PriLOSEC (20MG Capsule DR, Oral) Active. Illusions C Breast Prosthesis (1 (one)) Active. (L8001 Uni-lateral PermaForm - Integrated prosthesis in bra for ease of use or for compression; QTY: 4; Length of Use: 3 months; Refills:; Replacement:) Cholecalciferol (400UNIT Capsule, Oral) Active. Levothyroxine Sodium (88MCG Tablet, Oral) Active. Levbid (0.375MG Tablet ER 12HR, Oral) Active. Anastrozole (1MG Tablet, Oral) Active. Medications Reconciled  Vitals  Weight: 172.2 lb Height: 68in Body Surface Area: 1.92 m Body Mass Index: 26.18 kg/m  Temp.: 97.19F  Pulse: 95 (Regular)  BP: 130/78 (Sitting, Left Arm, Standard)       Physical Exam  General Mental Status-Alert. General Appearance-Not in acute distress. Build & Nutrition-Well nourished. Posture-Normal posture. Gait-Normal.  Head and Neck Head-normocephalic, atraumatic with no lesions or palpable masses. Trachea-midline. Thyroid Gland Characteristics - normal size and consistency and no palpable nodules. Note: Will healed thyroidectomy scar. Sebaceous cyst left neck.   Chest and Lung Exam Chest and lung exam reveals -on auscultation, normal breath sounds, no adventitious sounds and normal vocal resonance.  Breast Note: Left breast shows some radiation changes. A little firmer and skin a little bit thicker not bad. Transverse lumpectomy scar lower in her left breast extends all the  way to the sternum. Well healed. Left axillary scar well healed. Port right infraclavicular area  looks fine. Otherwise there are no skin changes in either breast. No mass in either breast. No axillary adenopathy. Full range of motion left shoulder. No arm swelling.   Cardiovascular Cardiovascular examination reveals -normal heart sounds, regular rate and rhythm with no murmurs and femoral artery auscultation bilaterally reveals normal pulses, no bruits, no thrills.  Abdomen Inspection Inspection of the abdomen reveals - No Hernias. Palpation/Percussion Palpation and Percussion of the abdomen reveal - Soft, Non Tender, No Rigidity (guarding), No hepatosplenomegaly and No Palpable abdominal masses.  Neurologic Neurologic evaluation reveals -alert and oriented x 3 with no impairment of recent or remote memory, normal attention span and ability to concentrate, normal sensation and normal coordination.  Musculoskeletal Normal Exam - Bilateral-Upper Extremity Strength Normal and Lower Extremity Strength Normal.    Assessment & Plan PRIMARY CANCER OF LOWER-INNER QUADRANT OF LEFT FEMALE BREAST (C50.312)    Examination of both breasts and all the regional lymph nodes today is normal Bilateral mammograms performed on January 31, 2019 or normal Breast MRI performed on February 03, 2019 is normal There is no evidence of cancer  You are scheduled for me to remove your Port-A-Cath on December 21, after you complete 1 year of Herceptin chemotherapy Continue to take the anastrozole medicine Keep your appointment with Dr. Jana Hakim on December 1 and periodically thereafter  We did the MRI this year because we were surprised to find that you had multifocal cancer when we did a lumpectomy last year Since this MRI is normal and since your genetics are normal, I do not think that there is a strong indication to do annual MRI, unless something changes  You are aware that Dr. Dalbert Batman will retire in  January We are both in favor of transitioning your surgical care to one of the other breast cancer surgeons We have decided to sign you to Dr. Marcello Moores Cornett  Repeat bilateral mammograms in November, 2021 see Dr. Brantley Stage in 1 year after the mammograms are  HISTORY OF THYROIDECTOMY, TOTAL (E89.0) HISTORY OF TOTAL ABDOMINAL HYSTERECTOMY AND BILATERAL SALPINGO-OOPHORECTOMY (Z90.710) FAMILY HISTORY OF OVARIAN CANCER (Z80.41) CONTINUOUS TOBACCO ABUSE (Z72.0) HYPERTHYROIDISM, SUBCLINICAL (E05.90)    Edsel Petrin. Dalbert Batman, M.D., [facility] General and Minimally invasive Surgery Breast and Colorectal Surgery Office:   [phone]

## 2019-03-12 NOTE — Progress Notes (Signed)

## 2019-03-13 ENCOUNTER — Other Ambulatory Visit (HOSPITAL_COMMUNITY)
Admission: RE | Admit: 2019-03-13 | Discharge: 2019-03-13 | Disposition: A | Discharge status: Home / Self Care | Payer: Medicare Other | Source: Ambulatory Visit | Attending: General Surgery | Admitting: General Surgery

## 2019-03-13 DIAGNOSIS — Z01812 Encounter for preprocedural laboratory examination: Secondary | ICD-10-CM | POA: Insufficient documentation

## 2019-03-13 DIAGNOSIS — Z20828 Contact with and (suspected) exposure to other viral communicable diseases: Secondary | ICD-10-CM | POA: Insufficient documentation

## 2019-03-14 LAB — NOVEL CORONAVIRUS, NAA (HOSP ORDER, SEND-OUT TO REF LAB; TAT 18-24 HRS): SARS-CoV-2, NAA: NOT DETECTED

## 2019-03-17 ENCOUNTER — Other Ambulatory Visit: Payer: Self-pay

## 2019-03-17 ENCOUNTER — Ambulatory Visit (HOSPITAL_BASED_OUTPATIENT_CLINIC_OR_DEPARTMENT_OTHER): Payer: Medicare Other | Admitting: Certified Registered"

## 2019-03-17 ENCOUNTER — Encounter (HOSPITAL_BASED_OUTPATIENT_CLINIC_OR_DEPARTMENT_OTHER)
Admission: RE | Disposition: A | Discharge status: Home / Self Care | Payer: Self-pay | Source: Home / Self Care | Attending: General Surgery

## 2019-03-17 ENCOUNTER — Encounter (HOSPITAL_BASED_OUTPATIENT_CLINIC_OR_DEPARTMENT_OTHER): Payer: Self-pay | Admitting: General Surgery

## 2019-03-17 ENCOUNTER — Ambulatory Visit (HOSPITAL_BASED_OUTPATIENT_CLINIC_OR_DEPARTMENT_OTHER)
Admission: RE | Admit: 2019-03-17 | Discharge: 2019-03-17 | Disposition: A | Discharge status: Home / Self Care | Payer: Medicare Other | Attending: General Surgery | Admitting: General Surgery

## 2019-03-17 DIAGNOSIS — Z853 Personal history of malignant neoplasm of breast: Secondary | ICD-10-CM | POA: Diagnosis not present

## 2019-03-17 DIAGNOSIS — Z9221 Personal history of antineoplastic chemotherapy: Secondary | ICD-10-CM | POA: Insufficient documentation

## 2019-03-17 DIAGNOSIS — Z882 Allergy status to sulfonamides status: Secondary | ICD-10-CM | POA: Diagnosis not present

## 2019-03-17 DIAGNOSIS — Z87891 Personal history of nicotine dependence: Secondary | ICD-10-CM | POA: Insufficient documentation

## 2019-03-17 DIAGNOSIS — Z79890 Hormone replacement therapy (postmenopausal): Secondary | ICD-10-CM | POA: Insufficient documentation

## 2019-03-17 DIAGNOSIS — Z888 Allergy status to other drugs, medicaments and biological substances status: Secondary | ICD-10-CM | POA: Insufficient documentation

## 2019-03-17 DIAGNOSIS — I10 Essential (primary) hypertension: Secondary | ICD-10-CM | POA: Diagnosis not present

## 2019-03-17 DIAGNOSIS — Z95828 Presence of other vascular implants and grafts: Secondary | ICD-10-CM

## 2019-03-17 DIAGNOSIS — Z170 Estrogen receptor positive status [ER+]: Secondary | ICD-10-CM

## 2019-03-17 DIAGNOSIS — K219 Gastro-esophageal reflux disease without esophagitis: Secondary | ICD-10-CM | POA: Diagnosis not present

## 2019-03-17 DIAGNOSIS — Z79899 Other long term (current) drug therapy: Secondary | ICD-10-CM | POA: Insufficient documentation

## 2019-03-17 DIAGNOSIS — Z452 Encounter for adjustment and management of vascular access device: Secondary | ICD-10-CM | POA: Diagnosis not present

## 2019-03-17 DIAGNOSIS — Z880 Allergy status to penicillin: Secondary | ICD-10-CM | POA: Insufficient documentation

## 2019-03-17 DIAGNOSIS — I209 Angina pectoris, unspecified: Secondary | ICD-10-CM | POA: Diagnosis not present

## 2019-03-17 DIAGNOSIS — Z881 Allergy status to other antibiotic agents status: Secondary | ICD-10-CM | POA: Diagnosis not present

## 2019-03-17 DIAGNOSIS — Z79811 Long term (current) use of aromatase inhibitors: Secondary | ICD-10-CM | POA: Diagnosis not present

## 2019-03-17 DIAGNOSIS — C50312 Malignant neoplasm of lower-inner quadrant of left female breast: Secondary | ICD-10-CM

## 2019-03-17 HISTORY — PX: PORT-A-CATH REMOVAL: SHX5289

## 2019-03-17 SURGERY — REMOVAL PORT-A-CATH
Anesthesia: Monitor Anesthesia Care | Site: Chest

## 2019-03-17 MED ORDER — CHLORHEXIDINE GLUCONATE CLOTH 2 % EX PADS: 6.0000 | MEDICATED_PAD | Freq: Once | CUTANEOUS | Status: DC

## 2019-03-17 MED ORDER — MIDAZOLAM HCL 2 MG/2ML IJ SOLN: 1.0000 mg | INTRAMUSCULAR | Status: DC | PRN

## 2019-03-17 MED ORDER — SODIUM CHLORIDE 0.9% FLUSH: 3.0000 mL | Freq: Two times a day (BID) | INTRAVENOUS | Status: DC

## 2019-03-17 MED ORDER — ACETAMINOPHEN 500 MG PO TABS
ORAL_TABLET | ORAL | Status: AC
  Filled 2019-03-17: qty 2

## 2019-03-17 MED ORDER — FENTANYL CITRATE (PF) 100 MCG/2ML IJ SOLN
INTRAMUSCULAR | Status: AC
  Filled 2019-03-17: qty 2

## 2019-03-17 MED ORDER — ONDANSETRON HCL 4 MG/2ML IJ SOLN: 4.0000 mg | Freq: Once | INTRAMUSCULAR | Status: DC | PRN

## 2019-03-17 MED ORDER — FENTANYL CITRATE (PF) 100 MCG/2ML IJ SOLN
50.0000 ug | INTRAMUSCULAR | Status: DC | PRN
  Administered 2019-03-17: 50 ug via INTRAVENOUS

## 2019-03-17 MED ORDER — PROPOFOL 10 MG/ML IV BOLUS
INTRAVENOUS | Status: DC | PRN
  Administered 2019-03-17 (×2): 20 mg via INTRAVENOUS

## 2019-03-17 MED ORDER — OXYCODONE HCL 5 MG PO TABS: 5.0000 mg | ORAL_TABLET | Freq: Once | ORAL | Status: DC | PRN

## 2019-03-17 MED ORDER — ACETAMINOPHEN 500 MG PO TABS
1000.0000 mg | ORAL_TABLET | ORAL | Status: AC
  Administered 2019-03-17: 13:00:00 1000 mg via ORAL

## 2019-03-17 MED ORDER — SODIUM BICARBONATE 4 % IV SOLN
INTRAVENOUS | Status: DC | PRN
  Administered 2019-03-17: 1 mL via INTRAVENOUS

## 2019-03-17 MED ORDER — ONDANSETRON HCL 4 MG/2ML IJ SOLN
INTRAMUSCULAR | Status: DC | PRN
  Administered 2019-03-17: 4 mg via INTRAVENOUS

## 2019-03-17 MED ORDER — OXYCODONE HCL 5 MG/5ML PO SOLN: 5.0000 mg | Freq: Once | ORAL | Status: DC | PRN

## 2019-03-17 MED ORDER — GABAPENTIN 300 MG PO CAPS
300.0000 mg | ORAL_CAPSULE | ORAL | Status: AC
  Administered 2019-03-17: 13:00:00 300 mg via ORAL

## 2019-03-17 MED ORDER — LIDOCAINE-EPINEPHRINE 1 %-1:100000 IJ SOLN
INTRAMUSCULAR | Status: DC | PRN
  Administered 2019-03-17: 9 mL

## 2019-03-17 MED ORDER — FENTANYL CITRATE (PF) 100 MCG/2ML IJ SOLN: 25.0000 ug | INTRAMUSCULAR | Status: DC | PRN

## 2019-03-17 MED ORDER — SODIUM BICARBONATE 4.2 % IV SOLN
INTRAVENOUS | Status: AC
  Filled 2019-03-17: qty 10

## 2019-03-17 MED ORDER — LIDOCAINE 2% (20 MG/ML) 5 ML SYRINGE
INTRAMUSCULAR | Status: AC
  Filled 2019-03-17: qty 5

## 2019-03-17 MED ORDER — LACTATED RINGERS IV SOLN: INTRAVENOUS | Status: DC

## 2019-03-17 MED ORDER — PROPOFOL 10 MG/ML IV BOLUS
INTRAVENOUS | Status: DC | PRN
  Administered 2019-03-17: 75 ug/kg/min via INTRAVENOUS

## 2019-03-17 MED ORDER — ONDANSETRON HCL 4 MG/2ML IJ SOLN
INTRAMUSCULAR | Status: AC
  Filled 2019-03-17: qty 2

## 2019-03-17 MED ORDER — LIDOCAINE HCL (CARDIAC) PF 100 MG/5ML IV SOSY
PREFILLED_SYRINGE | INTRAVENOUS | Status: DC | PRN
  Administered 2019-03-17: 60 mg via INTRAVENOUS

## 2019-03-17 MED ORDER — PROPOFOL 10 MG/ML IV BOLUS
INTRAVENOUS | Status: AC
  Filled 2019-03-17: qty 40

## 2019-03-17 MED ORDER — GABAPENTIN 300 MG PO CAPS
ORAL_CAPSULE | ORAL | Status: AC
  Filled 2019-03-17: qty 1

## 2019-03-17 SURGICAL SUPPLY — 46 items
ADH SKN CLS APL DERMABOND .7 (GAUZE/BANDAGES/DRESSINGS) ×1
APL PRP STRL LF DISP 70% ISPRP (MISCELLANEOUS) ×1
APL SKNCLS STERI-STRIP NONHPOA (GAUZE/BANDAGES/DRESSINGS)
BENZOIN TINCTURE PRP APPL 2/3 (GAUZE/BANDAGES/DRESSINGS) IMPLANT
BLADE HEX COATED 2.75 (ELECTRODE) ×3 IMPLANT
BLADE SURG 15 STRL LF DISP TIS (BLADE) ×1 IMPLANT
BLADE SURG 15 STRL SS (BLADE) ×3
CHLORAPREP W/TINT 26 (MISCELLANEOUS) ×3 IMPLANT
CLOSURE WOUND 1/2 X4 (GAUZE/BANDAGES/DRESSINGS)
COVER BACK TABLE REUSABLE LG (DRAPES) ×3 IMPLANT
COVER MAYO STAND REUSABLE (DRAPES) ×3 IMPLANT
COVER WAND RF STERILE (DRAPES) IMPLANT
DECANTER SPIKE VIAL GLASS SM (MISCELLANEOUS) IMPLANT
DERMABOND ADVANCED (GAUZE/BANDAGES/DRESSINGS) ×2
DERMABOND ADVANCED .7 DNX12 (GAUZE/BANDAGES/DRESSINGS) ×1 IMPLANT
DRAPE LAPAROTOMY 100X72 PEDS (DRAPES) ×3 IMPLANT
DRAPE UTILITY XL STRL (DRAPES) ×3 IMPLANT
DRSG TEGADERM 4X4.75 (GAUZE/BANDAGES/DRESSINGS) IMPLANT
ELECT REM PT RETURN 9FT ADLT (ELECTROSURGICAL) ×3
ELECTRODE REM PT RTRN 9FT ADLT (ELECTROSURGICAL) ×1 IMPLANT
GAUZE SPONGE 4X4 12PLY STRL LF (GAUZE/BANDAGES/DRESSINGS) IMPLANT
GLOVE BIOGEL PI IND STRL 6.5 (GLOVE) IMPLANT
GLOVE BIOGEL PI IND STRL 7.5 (GLOVE) IMPLANT
GLOVE BIOGEL PI INDICATOR 6.5 (GLOVE) ×2
GLOVE BIOGEL PI INDICATOR 7.5 (GLOVE) ×2
GLOVE SKINSENSE NS SZ7.5 (GLOVE) ×2
GLOVE SKINSENSE STRL SZ7.5 (GLOVE) IMPLANT
GLOVE SS BIOGEL STRL SZ 7 (GLOVE) ×1 IMPLANT
GLOVE SUPERSENSE BIOGEL SZ 7 (GLOVE) ×2
GOWN STRL REUS W/ TWL LRG LVL3 (GOWN DISPOSABLE) ×1 IMPLANT
GOWN STRL REUS W/ TWL XL LVL3 (GOWN DISPOSABLE) ×1 IMPLANT
GOWN STRL REUS W/TWL LRG LVL3 (GOWN DISPOSABLE) ×3
GOWN STRL REUS W/TWL XL LVL3 (GOWN DISPOSABLE) ×3
NDL HYPO 25X1 1.5 SAFETY (NEEDLE) ×1 IMPLANT
NEEDLE HYPO 25X1 1.5 SAFETY (NEEDLE) ×3 IMPLANT
PACK BASIN DAY SURGERY FS (CUSTOM PROCEDURE TRAY) ×3 IMPLANT
PENCIL SMOKE EVACUATOR (MISCELLANEOUS) ×3 IMPLANT
SLEEVE SCD COMPRESS KNEE MED (MISCELLANEOUS) ×3 IMPLANT
STRIP CLOSURE SKIN 1/2X4 (GAUZE/BANDAGES/DRESSINGS) IMPLANT
SUT MNCRL AB 4-0 PS2 18 (SUTURE) ×3 IMPLANT
SUT VICRYL 3-0 CR8 SH (SUTURE) ×3 IMPLANT
SYR 10ML LL (SYRINGE) ×3 IMPLANT
TOWEL GREEN STERILE FF (TOWEL DISPOSABLE) ×3 IMPLANT
TUBE CONNECTING 20'X1/4 (TUBING)
TUBE CONNECTING 20X1/4 (TUBING) IMPLANT
YANKAUER SUCT BULB TIP NO VENT (SUCTIONS) IMPLANT

## 2019-03-17 NOTE — Anesthesia Preprocedure Evaluation (Signed)
Anesthesia Evaluation  Patient identified by MRN, date of birth, ID band Patient awake    Reviewed: Allergy & Precautions, NPO status , Patient's Chart, lab work & pertinent test results  History of Anesthesia Complications (+) PONV and history of anesthetic complications  Airway Mallampati: I  TM Distance: >3 FB Neck ROM: Full    Dental no notable dental hx. (+) Teeth Intact   Pulmonary pneumonia, resolved, Patient abstained from smoking., former smoker,    Pulmonary exam normal breath sounds clear to auscultation       Cardiovascular hypertension, Pt. 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  Neuromuscular disease negative psych ROS   GI/Hepatic Neg liver ROS, hiatal hernia, GERD  Medicated and Controlled,  Endo/Other  Hyperthyroidism Dyslipidemia  Renal/GU negative Renal ROS  negative genitourinary   Musculoskeletal negative musculoskeletal ROS (+)   Abdominal   Peds  Hematology negative hematology ROS (+)   Anesthesia Other Findings   Reproductive/Obstetrics negative OB ROS                             Anesthesia Physical Anesthesia Plan  ASA: II  Anesthesia Plan: MAC   Post-op Pain Management:    Induction:   PONV Risk Score and Plan: 3 and Ondansetron, Treatment may vary due to age or medical condition and Dexamethasone  Airway Management Planned: Natural Airway, Nasal Cannula and Simple Face Mask  Additional Equipment:   Intra-op Plan:   Post-operative Plan:   Informed Consent: I have reviewed the patients History and Physical, chart, labs and discussed the procedure including the risks, benefits and alternatives for the proposed anesthesia with the patient or authorized representative who has indicated his/her understanding and acceptance.     Dental  advisory given  Plan Discussed with: CRNA and Surgeon  Anesthesia Plan Comments:         Anesthesia Quick Evaluation

## 2019-03-17 NOTE — Interval H&P Note (Signed)
History and Physical Interval Note:  03/17/2019 12:39 PM  Margaret Hodges  has presented today for surgery, with the diagnosis of BREAST CANCER.  The various methods of treatment have been discussed with the patient and family. After consideration of risks, benefits and other options for treatment, the patient has consented to  Procedure(s): REMOVAL PORT-A-CATH (N/A) as a surgical intervention.  The patient's history has been reviewed, patient examined, no change in status, stable for surgery.  I have reviewed the patient's chart and labs.  Questions were answered to the patient's satisfaction.     Adin Hector

## 2019-03-17 NOTE — Transfer of Care (Signed)
Immediate Anesthesia Transfer of Care Note  Patient: Margaret Hodges  Procedure(s) Performed: REMOVAL PORT-A-CATH (N/A )  Patient Location: PACU  Anesthesia Type:MAC  Level of Consciousness: awake, alert  and oriented  Airway & Oxygen Therapy: Patient Spontanous Breathing  Post-op Assessment: Report given to RN and Post -op Vital signs reviewed and stable  Post vital signs: Reviewed and stable  Last Vitals:  Vitals Value Taken Time  BP    Temp    Pulse    Resp    SpO2      Last Pain:  Vitals:   03/17/19 1228  TempSrc: Oral  PainSc: 0-No pain         Complications: No apparent anesthesia complications

## 2019-03-17 NOTE — Anesthesia Postprocedure Evaluation (Signed)
Anesthesia Post Note  Patient: SUSA BONES  Procedure(s) Performed: REMOVAL PORT-A-CATH (N/A Chest)     Patient location during evaluation: PACU Anesthesia Type: MAC Level of consciousness: awake and alert and oriented Pain management: pain level controlled Vital Signs Assessment: post-procedure vital signs reviewed and stable Respiratory status: spontaneous breathing, nonlabored ventilation and respiratory function stable Cardiovascular status: stable and blood pressure returned to baseline Postop Assessment: no apparent nausea or vomiting Anesthetic complications: no    Last Vitals:  Vitals:   03/17/19 1228 03/17/19 1430  BP: 107/61 (!) 133/59  Pulse: 83 88  Resp: 18 16  Temp: 36.5 C 36.6 C  SpO2: 98% 99%    Last Pain:  Vitals:   03/17/19 1430  TempSrc: Oral  PainSc: 0-No pain                 Shawnna Pancake A.

## 2019-03-17 NOTE — Op Note (Signed)
Patient Name:           Margaret Hodges   Date of Surgery:        03/17/2019  Pre op Diagnosis:      Cancer left breast                                       History chemotherapy  Post op Diagnosis:    Same  Procedure:                 Removal of Port-A-Cath  Surgeon:                     Edsel Petrin. Dalbert Batman, M.D., FACS  Assistant:                      Or staff  Operative Indications:   This is a very pleasant 73 year old female who is brought to the operating room for Port-A-Cath removal. Dr. Jana Hakim and Dr. Lisbeth Renshaw are involved in her care. [name] is her PCP.      On February 18, 2018 she underwent Port-A-Cath insertion, left breast lumpectomy in the lower inner quadrant sentinel node biopsy.  Surprisingly, we found 2 tumors 2.7 cm invasive adenocarcinoma and a 1.3 cm invasive ductal carcinoma. Fortunately, we had excellent margins. All 5 sentinel nodes are negative. Estrogen receptor positive but weak. HER-2 positive. Ki-67 80% Genetics are negative.    radiation therapy was completed on June 26, 2018 She has completed Herceptin chemotherapy. She will continue to be followed by Dr. Jana Hakim and Dr. Erroll Luna  Operative Findings:       The port was removed from the right infraclavicular area without complications or signs of infection.  There was no bleeding.  Procedure in Detail:          The patient was brought to the operating room and was sedated by the anesthesia department.  The right upper chest wall was prepped and draped in a sterile fashion.  Surgical timeout was performed.  1% Xylocaine with epinephrine was used as local infiltration anesthetic.  A transverse incision was made through the old scar overlying the port.  Dissection was carried down to the port which was dissected away from its capsule.  The Prolene sutures were cut and removed and the port and catheter removed intact.  The subcutaneous tissue was closed with 3-0 Vicryl sutures and the skin closed  with a running subcuticular 4-0 Monocryl and Dermabond.  The patient tolerated the procedure well and was taken to PACU in stable condition.  EBL 2 cc or less.  Counts correct.  Complications none.     Edsel Petrin. Dalbert Batman, M.D., FACS General and Minimally Invasive Surgery Breast and Colorectal Surgery  03/17/2019 2:12 PM

## 2019-03-17 NOTE — Discharge Instructions (Signed)
Keep the wound clean and dry for 24 hours. Ice pack for 10 minutes at a time, off and on for 24 hours. You may shower tomorrow The clear plastic superglue will wear off in about 3 weeks  You may drive your car in a day or 2.  Take Tylenol, 1000 mg every 6 hours for 2 days and that will control the pain very nicely.  Repeat mammograms in 1 year See Dr. Erroll Luna in 1 year after the mammograms are done Continue regular follow-up with Dr. Jana Hakim    No Tylenol until 6:45pm    Post Anesthesia Home Care Instructions  Activity: Get plenty of rest for the remainder of the day. A responsible individual must stay with you for 24 hours following the procedure.  For the next 24 hours, DO NOT: -Drive a car -Paediatric nurse -Drink alcoholic beverages -Take any medication unless instructed by your physician -Make any legal decisions or sign important papers.  Meals: Start with liquid foods such as gelatin or soup. Progress to regular foods as tolerated. Avoid greasy, spicy, heavy foods. If nausea and/or vomiting occur, drink only clear liquids until the nausea and/or vomiting subsides. Call your physician if vomiting continues.  Special Instructions/Symptoms: Your throat may feel dry or sore from the anesthesia or the breathing tube placed in your throat during surgery. If this causes discomfort, gargle with warm salt water. The discomfort should disappear within 24 hours.  If you had a scopolamine patch placed behind your ear for the management of post- operative nausea and/or vomiting:  1. The medication in the patch is effective for 72 hours, after which it should be removed.  Wrap patch in a tissue and discard in the trash. Wash hands thoroughly with soap and water. 2. You may remove the patch earlier than 72 hours if you experience unpleasant side effects which may include dry mouth, dizziness or visual disturbances. 3. Avoid touching the patch. Wash your hands with soap and  water after contact with the patch.

## 2019-04-29 DIAGNOSIS — I700 Atherosclerosis of aorta: Secondary | ICD-10-CM | POA: Diagnosis not present

## 2019-04-29 DIAGNOSIS — Z79899 Other long term (current) drug therapy: Secondary | ICD-10-CM | POA: Diagnosis not present

## 2019-04-29 DIAGNOSIS — Z720 Tobacco use: Secondary | ICD-10-CM | POA: Diagnosis not present

## 2019-04-29 DIAGNOSIS — E785 Hyperlipidemia, unspecified: Secondary | ICD-10-CM | POA: Diagnosis not present

## 2019-04-29 DIAGNOSIS — Z131 Encounter for screening for diabetes mellitus: Secondary | ICD-10-CM | POA: Diagnosis not present

## 2019-04-29 DIAGNOSIS — D7589 Other specified diseases of blood and blood-forming organs: Secondary | ICD-10-CM | POA: Diagnosis not present

## 2019-04-29 DIAGNOSIS — Z0000 Encounter for general adult medical examination without abnormal findings: Secondary | ICD-10-CM | POA: Diagnosis not present

## 2019-04-29 DIAGNOSIS — E559 Vitamin D deficiency, unspecified: Secondary | ICD-10-CM | POA: Diagnosis not present

## 2019-04-29 DIAGNOSIS — F330 Major depressive disorder, recurrent, mild: Secondary | ICD-10-CM | POA: Diagnosis not present

## 2019-04-29 DIAGNOSIS — K219 Gastro-esophageal reflux disease without esophagitis: Secondary | ICD-10-CM | POA: Diagnosis not present

## 2019-04-29 DIAGNOSIS — E538 Deficiency of other specified B group vitamins: Secondary | ICD-10-CM | POA: Diagnosis not present

## 2019-04-29 DIAGNOSIS — E049 Nontoxic goiter, unspecified: Secondary | ICD-10-CM | POA: Diagnosis not present

## 2019-04-29 DIAGNOSIS — E039 Hypothyroidism, unspecified: Secondary | ICD-10-CM | POA: Diagnosis not present

## 2019-05-26 NOTE — Progress Notes (Signed)
SURVIVORSHIP VISIT:    BRIEF ONCOLOGIC HISTORY:  Oncology History  Malignant neoplasm of lower-inner quadrant of left breast in female, estrogen receptor positive ([place])  01/24/2018 Initial Diagnosis   Malignant neoplasm of lower-inner quadrant of left breast in female, estrogen receptor positive ([place])   02/18/2018 Surgery   Left Lumpectomy Dalbert Batman) [phone]): IDC, stage IIA, grade 3. ER positive, PR negative, HER-2 positive, with a Ki67 of 80%. Negative margins. 5/5 lymph nodes were negative for carcinoma.    03/06/2018 Genetic Testing   Negative genetic testing on the common hereditary cancer panel.  The Hereditary Gene Panel offered by [facility] includes sequencing and/or deletion duplication testing of the following 47 genes: APC, ATM, AXIN2, BARD1, BMPR1A, BRCA1, BRCA2, BRIP1, CDH1, CDK4, CDKN2A (p14ARF), CDKN2A (p16INK4a), CHEK2, CTNNA1, DICER1, EPCAM (Deletion/duplication testing only), GREM1 (promoter region deletion/duplication testing only), KIT, MEN1, MLH1, MSH2, MSH3, MSH6, MUTYH, NBN, NF1, NHTL1, PALB2, PDGFRA, PMS2, POLD1, POLE, PTEN, RAD50, RAD51C, RAD51D, SDHB, SDHC, SDHD, SMAD4, SMARCA4. STK11, TP53, TSC1, TSC2, and VHL.  The following genes were evaluated for sequence changes only: SDHA and HOXB13 c.251G>A variant only. The report date is 03/14/2018.   03/08/2018 - 04/18/2018 Chemotherapy   PACLitaxel (TAXOL) 150 mg in sodium chloride 0.9 % 250 mL chemo infusion (</= 38m/m2), 80 mg/m2 = 150 mg, Intravenous,  Once, 6 of 12 cycles. Administration: 150 mg (03/08/2018), 150 mg (03/15/2018), 150 mg (03/22/2018), 150 mg (03/29/2018), 150 mg (04/05/2018), 150 mg (04/12/2018)  (HERCEPTIN) 600 mg in sodium chloride 0.9 % 250 mL chemo infusion, 588 mg, Intravenous,  Once, 11 of 11 cycles Dose modification: 8 mg/kg (original dose 6 mg/kg, Cycle 8, Reason: Provider Judgment) Administration: 600 mg (03/08/2018), 450 mg (03/29/2018), 450 mg (05/31/2018), 450 mg (04/19/2018), 450 mg (05/10/2018), 450 mg  (06/21/2018), 450 mg (07/12/2018), 600 mg (08/23/2018), 450 mg (09/10/2018), 450 mg (10/04/2018), 450 mg (10/25/2018)  trastuzumab-dkst (OGIVRI) 450 mg in sodium chloride 0.9 % 250 mL chemo infusion, 450 mg (100 % of original dose 450 mg), Intravenous,  Once, 6 of 6 cycles. Dose modification: 450 mg (original dose 450 mg, Cycle 12, Reason: Other (see comments), Comment: Biosimilar Conversion). Administration: 450 mg (11/12/2018), 450 mg (12/06/2018), 450 mg (12/27/2018), 450 mg (01/17/2019), 450 mg (02/07/2019), 450 mg (02/25/2019)   05/30/2018 - 06/26/2018 Radiation Therapy   The patient initially received a dose of 42.56 Gy in 16 fractions to the breast using whole-breast tangent fields. This was delivered using a 3-D conformal technique. The pt received a boost delivering an additional 8 Gy in 4 fractions using a electron boost with 152m electrons. The total dose was 50.56 Gy.    08/2018 - 08/2023 Anti-estrogen oral therapy   Anastrozole     INTERVAL HISTORY:  [name] review her survivorship care plan detailing her treatment course for breast cancer, as well as monitoring long-term side effects of that treatment, education regarding health maintenance, screening, and overall wellness and health promotion.     Overall, [name] feeling quite well.  She is taking anastrozole every other day and notes that she initially didn't want to take anything at all due to potential side effects, so that she and Dr. MaJana Hakimgreed on this every other day.  She tolerates it moderately well.  She completed a survivorship questionnaire and HRQOL CSV which will be scanned under the media tab.  She notes that her main issues are related to being the primary caregiver for her terminally ill husband, and this has greatly impacted her QOL.  She notes that he is moving to a facility a few weeks, and anticipates her stress level to improve.  She has residual peripheral neuropathy from the chemotherapy she received.   This is mild, and is not particularly problematic.    REVIEW OF SYSTEMS:  Review of Systems  Constitutional: Positive for fatigue. Negative for appetite change, chills, fever and unexpected weight change.  HENT:   Negative for hearing loss and lump/mass.   Eyes: Negative for eye problems and icterus.  Respiratory: Negative for chest tightness, cough and shortness of breath.   Cardiovascular: Negative for chest pain, leg swelling and palpitations.  Gastrointestinal: Negative for abdominal distention, abdominal pain, constipation, diarrhea, nausea and vomiting.  Endocrine: Negative for hot flashes.  Genitourinary: Negative for difficulty urinating.   Musculoskeletal: Negative for arthralgias.  Skin: Negative for itching and rash.  Neurological: Positive for numbness. Negative for dizziness, extremity weakness and headaches.  Hematological: Negative for adenopathy.  Psychiatric/Behavioral: Negative for depression. The patient is not nervous/anxious.   Breast: Denies any new nodularity, masses, tenderness, nipple changes, or nipple discharge.      ONCOLOGY TREATMENT TEAM:  1. Surgeon:  Dr. Dalbert Batman at [facility] 2. Medical Oncologist: Dr. Jana Hakim  3. Radiation Oncologist: Dr. Lisbeth Renshaw    PAST MEDICAL/SURGICAL HISTORY:  Past Medical History:  Diagnosis Date  . Anginal pain ([place])    with admission to hospital due to high blood pressure  . Complication of anesthesia   . Family history of ovarian cancer   . GERD (gastroesophageal reflux disease)   . Guillain Barr syndrome ([place]) 1959  . Guillain Barr syndrome ([place]) 1959  . Guillain Barr syndrome ([place]) 1959  . Headache    sinus headaches  . History of hiatal hernia    was told pt. had one, but no testing  . Hypertensive urgency 04/05/2014  . Malignant neoplasm of lower-inner quadrant of left breast in female, estrogen receptor positive ([place]) 01/24/2018  . Neuromuscular disorder ([place])    Guillian- Barre Syndrome  .  Pneumonia    hx. of walking pneumonia  . PONV (postoperative nausea and vomiting)    Past Surgical History:  Procedure Laterality Date  . ABDOMINAL HYSTERECTOMY     Total  . Bladder lift  15 years ago,  Uterus prolapse causing intestinal damage and surgery    . BREAST LUMPECTOMY Left 02/18/2018  . BREAST LUMPECTOMY WITH RADIOACTIVE SEED AND SENTINEL LYMPH NODE BIOPSY Left 02/18/2018   Procedure: LEFT BREAST LUMPECTOMY WITH RADIOACTIVE SEED AND LEFT AXILLARY DEEP SENTINEL LYMPH NODE BIOPSY, INJECT BLUE DYE LEFT BREAST;  Surgeon: Fanny Skates, MD;  Location: [place];  Service: General;  Laterality: Left;  . PORT-A-CATH REMOVAL N/A 03/17/2019   Procedure: REMOVAL PORT-A-CATH;  Surgeon: Fanny Skates, MD;  Location: [place];  Service: General;  Laterality: N/A;  . PORTACATH PLACEMENT Right 02/18/2018   Procedure: INSERTION PORT-A-CATH;  Surgeon: Fanny Skates, MD;  Location: [facility];  Service: General;  Laterality: Right;  . Right torn meniscus repair x 2    . THYROID LOBECTOMY N/A 02/04/2015   Procedure: TOTAL THYROIDECTOMY;  Surgeon: Armandina Gemma, MD;  Location: [facility];  Service: General;  Laterality: N/A;  . TONSILLECTOMY    . TUMOR REMOVAL    . Tumors on arms removed,  Tumor on gum removed,  Tumor removed from inside ear       ALLERGIES:  Allergies  Allergen Reactions  . Allegra [Fexofenadine] Hives  . Clindamycin/Lincomycin Hives and  Diarrhea  . Influenza A (H1n1) Monoval Vac Other (See Comments)    Guillian Barre Syndrome  . Keflex [Cephalexin] Hives  . Penicillins Swelling    Has patient had a PCN reaction causing immediate rash, facial/tongue/throat swelling, SOB or lightheadedness with hypotension: No Has patient had a PCN reaction causing severe rash involving mucus membranes or skin necrosis: No Has patient had a PCN reaction that required hospitalization No Has patient had a PCN reaction occurring within the last  10 years: No If all of the above answers are "NO", then may proceed with Cephalosporin use.   . Prednisone Other (See Comments)    "heart attack symptoms" but even had heart cath & had no cardiac disease  . Protonix [Pantoprazole Sodium] Diarrhea    Tolerates Prilosec  . Statins Other (See Comments)    myalgias     CURRENT MEDICATIONS:  Outpatient Encounter Medications as of 05/27/2019  Medication Sig  . amLODipine (NORVASC) 5 MG tablet Take 1 tablet (5 mg total) by mouth daily.  Marland Kitchen anastrozole (ARIMIDEX) 1 MG tablet Take 1 tablet (1 mg total) by mouth daily.  . Cholecalciferol (VITAMIN D) 2000 UNITS tablet Take 2,000 Units by mouth daily.  . clobetasol cream (TEMOVATE) 0.05 % APPLY TO AFFECTED AREA TWICE A DAY  . diclofenac sodium (VOLTAREN) 1 % GEL Apply 4 g topically 4 (four) times daily as needed (arthritis pain).   . hyoscyamine (LEVBID) 0.375 MG 12 hr tablet Take 0.375 mg by mouth every 12 (twelve) hours as needed (blaader spasms).  Marland Kitchen ketoconazole (NIZORAL) 2 % cream Apply 1 application topically daily.  Marland Kitchen levothyroxine (SYNTHROID, LEVOTHROID) 100 MCG tablet Take 100 mcg by mouth daily.  Marland Kitchen omeprazole (PRILOSEC) 40 MG capsule TAKE 1 CAPSULE EVERY DAY  . prochlorperazine (COMPAZINE) 10 MG tablet TAKE 1 TABLET EVERY 6 HOUR AS NEEDED FOR NAUSEA  . saccharomyces boulardii (FLORASTOR) 250 MG capsule Take 250 mg by mouth daily as needed (only when on antibiotic).   No facility-administered encounter medications on file as of 05/27/2019.     ONCOLOGIC FAMILY HISTORY:  Family History  Problem Relation Age of Onset  . Ovarian cancer Sister   . Lung cancer Sister   . Throat cancer Brother   . Brain cancer Sister      GENETIC COUNSELING/TESTING: See above  SOCIAL HISTORY:  Social History   Socioeconomic History  . Marital status: Married    Spouse name: Not on file  . Number of children: Not on file  . Years of education: Not on file  . Highest education level: Not on file   Occupational History  . Not on file  Tobacco Use  . Smoking status: Former Smoker    Packs/day: 1.50    Types: Cigarettes  . Smokeless tobacco: Never Used  Substance and Sexual Activity  . Alcohol use: No  . Drug use: No  . Sexual activity: Not Currently  Other Topics Concern  . Not on file  Social History Narrative  . Not on file   Social Determinants of Health   Financial Resource Strain:   . Difficulty of Paying Living Expenses: Not on file  Food Insecurity:   . Worried About Charity fundraiser in the Last Year: Not on file  . Ran Out of Food in the Last Year: Not on file  Transportation Needs:   . Lack of Transportation (Medical): Not on file  . Lack of Transportation (Non-Medical): Not on file  Physical Activity:   . Days  of Exercise per Week: Not on file  . Minutes of Exercise per Session: Not on file  Stress:   . Feeling of Stress : Not on file  Social Connections:   . Frequency of Communication with Friends and Family: Not on file  . Frequency of Social Gatherings with Friends and Family: Not on file  . Attends Religious Services: Not on file  . Active Member of Clubs or Organizations: Not on file  . Attends Archivist Meetings: Not on file  . Marital Status: Not on file  Intimate Partner Violence:   . Fear of Current or Ex-Partner: Not on file  . Emotionally Abused: Not on file  . Physically Abused: Not on file  . Sexually Abused: Not on file     OBSERVATIONS/OBJECTIVE:  BP 101/64 (BP Location: Left Arm, Patient Position: Sitting)   Pulse 80   Temp 98.3 F (36.8 C) (Temporal)   Resp 18   Ht _0  (1.727 m)   Wt 170 lb 14.4 oz (77.5 kg)   SpO2 99%   BMI 25.99 kg/m  GENERAL: Patient is a well appearing female in no acute distress HEENT:  Sclerae anicteric.  Mask in place.  Neck is supple.  NODES:  No cervical, supraclavicular, or axillary lymphadenopathy palpated.  BREAST EXAM:  Deferred. LUNGS:  Clear to auscultation bilaterally.  No  wheezes or rhonchi. HEART:  Regular rate and rhythm. No murmur appreciated. ABDOMEN:  Soft, nontender.  Positive, normoactive bowel sounds. No organomegaly palpated. MSK:  No focal spinal tenderness to palpation. Full range of motion bilaterally in the upper extremities. EXTREMITIES:  No peripheral edema.   SKIN:  Clear with no obvious rashes or skin changes. No nail dyscrasia. NEURO:  Nonfocal. Well oriented.  Appropriate affect.    LABORATORY DATA:  None for this visit.  DIAGNOSTIC IMAGING:  None for this visit.      ASSESSMENT AND PLAN:  Ms.. Margaret Hodges is a pleasant 74 y.o. female with Stage IIA left breast invasive ductal carcinoma, ER+/PR-/HER2+, diagnosed in 12/2017, treated with lumpectomy, adjuvant chemotherapy, maintenance Trastuzumab, radiation therapy, and anti-estrogen therapy with Anastrozole beginning in 08/2018.  She presents to the Survivorship Clinic for our initial meeting and routine follow-up post-completion of treatment for breast cancer.    1. Stage IIA left breast cancer:  [name] is continuing to recover from definitive treatment for breast cancer. She will follow-up with her medical oncologist, Dr. Jana Hakim in 10/2019 with history and physical exam per surveillance protocol.  She will continue her anti-estrogen therapy with Anastrozole. Thus far, she is tolerating the Anastrozole well, with minimal side effects.  Her mammogram is due 01/2020.   Today, a comprehensive survivorship care plan and treatment summary was reviewed with the patient today detailing her breast cancer diagnosis, treatment course, potential late/long-term effects of treatment, appropriate follow-up care with recommendations for the future, and patient education resources.  A copy of this summary, along with a letter will be sent to the patient's primary care provider via mail/fax/In Basket message after today's visit.    2. Positive distress screen: This is likely secondary to caregiver stress,  not so much as her cancer diagnosis.  I gave her information about our social worker should she need to reach out.    3. Bone health:  Given [name] age/history of breast cancer and her current treatment regimen including anti-estrogen therapy with Anastrozole, she is at risk for bone demineralization.  Her last DEXA scan was 12/2017, which showed osteopenia.  She is due for a repeat in October this [facility] at [facility].  In the meantime, she was encouraged to increase her consumption of foods rich in calcium, as well as increase her weight-bearing activities.  She was given education on specific activities to promote bone health.  4. Cancer screening:  Due to [name] history and her age, she should receive screening for skin cancers, colon cancer, lung cancer.  I offered to order her CT lung cancer screen due to her 54 pack year tobacco history, however she declined at this time.  The information and recommendations are listed on the patient's comprehensive care plan/treatment summary and were reviewed in detail with the patient.    5. Health maintenance and wellness promotion: [name] was encouraged to consume 5-7 servings of fruits and vegetables per day. We reviewed the "Nutrition Rainbow" handout, as well as the handout "Take Control of Your Health and Reduce Your Cancer Risk" from the [employer].  She was also encouraged to engage in moderate to vigorous exercise for 30 minutes per day most days of the week. We discussed the [employer] fitness program, which is designed for cancer survivors to help them become more physically fit after cancer treatments.  She was instructed to limit her alcohol consumption and was encouraged stop smoking.     6. Support services/counseling: It is not uncommon for this period of the patient's cancer care trajectory to be one of many emotions and stressors.  We discussed how this can be increasingly difficult during the times of  quarantine and social distancing due to the COVID-19 pandemic.   She was given information regarding our available services and encouraged to contact me with any questions or for help enrolling in any of our support group/programs.    Follow up instructions:    -Return to cancer center 10/2019 for f/u with Dr. Jana Hakim  -Mammogram due in 01/2020 -Follow up with me in 1 year -She was recommended to continue with the appropriate pandemic precautions.    The patient was provided an opportunity to ask questions and all were answered. The patient agreed with the plan and demonstrated an understanding of the instructions.   Total encounter time: 30 minutes  Wilber Bihari, NP 05/27/19 1:40 PM Medical Oncology and Hematology [facility] [address] [place], [place] [address] Tel. [phone]    Fax. [phone]  *Total Encounter Time as defined by the Centers for Medicare and Medicaid Services includes, in addition to the face-to-face time of a patient visit (documented in the note above) non-face-to-face time: obtaining and reviewing outside history, ordering and reviewing medications, tests or procedures, care coordination (communications with other health care professionals or caregivers) and documentation in the medical record.

## 2019-05-27 ENCOUNTER — Other Ambulatory Visit: Payer: Self-pay

## 2019-05-27 ENCOUNTER — Inpatient Hospital Stay: Payer: Medicare Other | Attending: Oncology | Admitting: Adult Health

## 2019-05-27 ENCOUNTER — Encounter: Payer: Self-pay | Admitting: Adult Health

## 2019-05-27 VITALS — BP 101/64 | HR 80 | Temp 98.3°F | Resp 18 | Ht 68.0 in | Wt 170.9 lb

## 2019-05-27 DIAGNOSIS — Z79899 Other long term (current) drug therapy: Secondary | ICD-10-CM | POA: Insufficient documentation

## 2019-05-27 DIAGNOSIS — Z87891 Personal history of nicotine dependence: Secondary | ICD-10-CM | POA: Diagnosis not present

## 2019-05-27 DIAGNOSIS — Z79811 Long term (current) use of aromatase inhibitors: Secondary | ICD-10-CM | POA: Insufficient documentation

## 2019-05-27 DIAGNOSIS — Z8041 Family history of malignant neoplasm of ovary: Secondary | ICD-10-CM | POA: Diagnosis not present

## 2019-05-27 DIAGNOSIS — C50312 Malignant neoplasm of lower-inner quadrant of left female breast: Secondary | ICD-10-CM | POA: Insufficient documentation

## 2019-05-27 DIAGNOSIS — Z801 Family history of malignant neoplasm of trachea, bronchus and lung: Secondary | ICD-10-CM | POA: Insufficient documentation

## 2019-05-27 DIAGNOSIS — Z170 Estrogen receptor positive status [ER+]: Secondary | ICD-10-CM | POA: Diagnosis not present

## 2019-05-27 DIAGNOSIS — Z90710 Acquired absence of both cervix and uterus: Secondary | ICD-10-CM | POA: Diagnosis not present

## 2019-05-27 DIAGNOSIS — M8580 Other specified disorders of bone density and structure, unspecified site: Secondary | ICD-10-CM | POA: Diagnosis not present

## 2019-05-27 DIAGNOSIS — Z923 Personal history of irradiation: Secondary | ICD-10-CM | POA: Insufficient documentation

## 2019-05-27 DIAGNOSIS — Z9221 Personal history of antineoplastic chemotherapy: Secondary | ICD-10-CM | POA: Insufficient documentation

## 2019-05-28 ENCOUNTER — Telehealth: Payer: Self-pay | Admitting: Adult Health

## 2019-05-28 NOTE — Telephone Encounter (Signed)
I talk with patient regarding schedule  

## 2019-05-29 ENCOUNTER — Encounter: Payer: Self-pay | Admitting: Licensed Clinical Social Worker

## 2019-05-29 NOTE — Progress Notes (Signed)
[facility] Quality of Life Screening Clinical Social Work  Clinical Social Work was referred by survivorship quality of life screening protocol.  The patient scored a 43.1 on the Quality of Life/ Cancer Survivor (QOL-CS) scale which indicates high quality of life.   Patient declined to have CSW contact to follow-up on screener. Stated to NP that husband is terminal and feels her quality of life will improve when he is in a care facility in a few weeks.   Follow up plan: 1. N/A- pt declined      Ellamay Fors E, LCSW

## 2019-10-21 ENCOUNTER — Other Ambulatory Visit: Payer: Self-pay | Admitting: Oncology

## 2019-10-27 ENCOUNTER — Telehealth: Payer: Self-pay | Admitting: Oncology

## 2019-10-27 NOTE — Telephone Encounter (Signed)
Rescheduled appts per provider PAL and print out with hand written instructions from provider. Pt confirmed new appt date and time.

## 2019-11-11 ENCOUNTER — Ambulatory Visit: Payer: Medicare Other | Admitting: Oncology

## 2019-11-11 ENCOUNTER — Other Ambulatory Visit: Payer: Medicare Other

## 2019-12-17 ENCOUNTER — Other Ambulatory Visit: Payer: Self-pay

## 2019-12-17 ENCOUNTER — Inpatient Hospital Stay (HOSPITAL_BASED_OUTPATIENT_CLINIC_OR_DEPARTMENT_OTHER): Payer: Medicare Other | Admitting: Oncology

## 2019-12-17 ENCOUNTER — Inpatient Hospital Stay: Payer: Medicare Other | Attending: Oncology

## 2019-12-17 VITALS — BP 139/69 | HR 91 | Temp 97.0°F | Resp 18 | Ht 68.0 in | Wt 167.0 lb

## 2019-12-17 DIAGNOSIS — Z8041 Family history of malignant neoplasm of ovary: Secondary | ICD-10-CM | POA: Insufficient documentation

## 2019-12-17 DIAGNOSIS — Z791 Long term (current) use of non-steroidal anti-inflammatories (NSAID): Secondary | ICD-10-CM | POA: Insufficient documentation

## 2019-12-17 DIAGNOSIS — G629 Polyneuropathy, unspecified: Secondary | ICD-10-CM | POA: Diagnosis not present

## 2019-12-17 DIAGNOSIS — Z79811 Long term (current) use of aromatase inhibitors: Secondary | ICD-10-CM | POA: Insufficient documentation

## 2019-12-17 DIAGNOSIS — Z79899 Other long term (current) drug therapy: Secondary | ICD-10-CM | POA: Diagnosis not present

## 2019-12-17 DIAGNOSIS — M8580 Other specified disorders of bone density and structure, unspecified site: Secondary | ICD-10-CM | POA: Diagnosis not present

## 2019-12-17 DIAGNOSIS — Z87891 Personal history of nicotine dependence: Secondary | ICD-10-CM | POA: Insufficient documentation

## 2019-12-17 DIAGNOSIS — C50312 Malignant neoplasm of lower-inner quadrant of left female breast: Secondary | ICD-10-CM | POA: Insufficient documentation

## 2019-12-17 DIAGNOSIS — Z720 Tobacco use: Secondary | ICD-10-CM

## 2019-12-17 DIAGNOSIS — Z170 Estrogen receptor positive status [ER+]: Secondary | ICD-10-CM

## 2019-12-17 DIAGNOSIS — Z801 Family history of malignant neoplasm of trachea, bronchus and lung: Secondary | ICD-10-CM | POA: Insufficient documentation

## 2019-12-17 DIAGNOSIS — K219 Gastro-esophageal reflux disease without esophagitis: Secondary | ICD-10-CM | POA: Insufficient documentation

## 2019-12-17 LAB — COMPREHENSIVE METABOLIC PANEL
ALT: 20 U/L (ref 0–44)
AST: 15 U/L (ref 15–41)
Albumin: 4.1 g/dL (ref 3.5–5.0)
Alkaline Phosphatase: 91 U/L (ref 38–126)
Anion gap: 4 — ABNORMAL LOW (ref 5–15)
BUN: 10 mg/dL (ref 8–23)
CO2: 28 mmol/L (ref 22–32)
Calcium: 9.9 mg/dL (ref 8.9–10.3)
Chloride: 107 mmol/L (ref 98–111)
Creatinine, Ser: 0.77 mg/dL (ref 0.44–1.00)
GFR calc Af Amer: >60 mL/min (ref >60)
GFR calc non Af Amer: >60 mL/min (ref >60)
Glucose, Bld: 100 mg/dL — ABNORMAL HIGH (ref 70–99)
Potassium: 4.2 mmol/L (ref 3.5–5.1)
Sodium: 139 mmol/L (ref 135–145)
Total Bilirubin: 0.4 mg/dL (ref 0.3–1.2)
Total Protein: 7.7 g/dL (ref 6.5–8.1)

## 2019-12-17 LAB — CBC WITH DIFFERENTIAL/PLATELET
Abs Immature Granulocytes: 0.05 10*3/uL (ref 0.00–0.07)
Basophils Absolute: 0.1 10*3/uL (ref 0.0–0.1)
Basophils Relative: 1 %
Eosinophils Absolute: 0.3 10*3/uL (ref 0.0–0.5)
Eosinophils Relative: 2 %
HCT: 43.4 % (ref 36.0–46.0)
Hemoglobin: 14.5 g/dL (ref 12.0–15.0)
Immature Granulocytes: 0 %
Lymphocytes Relative: 20 %
Lymphs Abs: 2.5 10*3/uL (ref 0.7–4.0)
MCH: 33.1 pg (ref 26.0–34.0)
MCHC: 33.4 g/dL (ref 30.0–36.0)
MCV: 99.1 fL (ref 80.0–100.0)
Monocytes Absolute: 0.9 10*3/uL (ref 0.1–1.0)
Monocytes Relative: 8 %
Neutro Abs: 8.7 10*3/uL — ABNORMAL HIGH (ref 1.7–7.7)
Neutrophils Relative %: 69 %
Platelets: 306 10*3/uL (ref 150–400)
RBC: 4.38 MIL/uL (ref 3.87–5.11)
RDW: 13.4 % (ref 11.5–15.5)
WBC: 12.5 10*3/uL — ABNORMAL HIGH (ref 4.0–10.5)
nRBC: 0 % (ref 0.0–0.2)

## 2019-12-17 NOTE — Progress Notes (Signed)
[place]  [phone] [phone]     ID: Margaret Hodges DOB: May 08, 1945  MR#: [id number]  NFA#:[id number]  Patient Care Team: [name], MD as PCP - General (Family Medicine) Fanny Skates, MD as Consulting Physician (General Surgery) Teryn Boerema, Virgie Dad, MD as Consulting Physician (Oncology) Kyung Rudd, MD as Consulting Physician (Radiation Oncology) Allyn Kenner, MD (Dermatology) Larey Dresser, MD as Consulting Physician (Cardiology) Bensimhon, Shaune Pascal, MD as Consulting Physician (Cardiology) OTHER MD:   CHIEF COMPLAINT: Estrogen receptor positive breast cancer  CURRENT TREATMENT: anastrozole   INTERVAL HISTORY: Margaret Hodges returns today for follow-up of her estrogen receptor positive breast cancer.    She started on anastrozole and ended up taking it 3 times a week.  She had no side effects other than mild hot flashes.  However she kept reading the possible side effects got spooked and stopped.  Since her last visit, she underwent port removal on 03/17/2019.  Recall she completed her trastuzumab treatments December 2020.  Her most recent mammogram was performed on 01/31/2019 at [place] [facility].  She had her breast MRI the next day, and it showed no evidence of malignancy.   REVIEW OF SYSTEMS: Margaret Hodges's husband died this morning after a long illness.  This was very hard on the whole family.  She is smoking a bit more as a result.  She is not exercising regularly.  She has had both doses of the Pfizer vaccine and tolerated it well.  A detailed review of systems today was otherwise stable.   HISTORY OF CURRENT ILLNESS: From the original intake note:  YER CASTELLO had routine screening mammography on 01/03/2018 showing a possible abnormality in the left breast. She underwent bilateral diagnostic mammography with tomography and left breast ultrasonography at [facility] on 01/15/2018 showing: Breast density category B; a 1.3 cm round solid  mass with an indistinct margin in the left breast at 7 o'clock posterior 8 cm from the nipple. No significant abnormalities found in the left axilla.  Accordingly on 01/16/2018 she proceeded to biopsy of the left breast area in question. The pathology from this procedure showed [phone]): Invasive ductal carcinoma, grade III. Prognostic indicators significant for: estrogen receptor, 60% positive, with weak staining intensity; progesterone receptor negative, Proliferation marker Ki67 at 80%. HER2 positive by immunohistochemistry, 3+.  The patient's subsequent history is as detailed below.   PAST MEDICAL HISTORY: Past Medical History:  Diagnosis Date  . Anginal pain ([place])    with admission to hospital due to high blood pressure  . Complication of anesthesia   . Family history of ovarian cancer   . GERD (gastroesophageal reflux disease)   . Guillain Barr syndrome ([place]) 1959  . Guillain Barr syndrome ([place]) 1959  . Guillain Barr syndrome ([place]) 1959  . Headache    sinus headaches  . History of hiatal hernia    was told pt. had one, but no testing  . Hypertensive urgency 04/05/2014  . Malignant neoplasm of lower-inner quadrant of left breast in female, estrogen receptor positive ([place]) 01/24/2018  . Neuromuscular disorder ([place])    Guillian- Barre Syndrome  . Pneumonia    hx. of walking pneumonia  . PONV (postoperative nausea and vomiting)     PAST SURGICAL HISTORY: Past Surgical History:  Procedure Laterality Date  . ABDOMINAL HYSTERECTOMY     Total  . Bladder lift  15 years ago,  Uterus prolapse causing intestinal damage and surgery    . BREAST LUMPECTOMY Left 02/18/2018  .  BREAST LUMPECTOMY WITH RADIOACTIVE SEED AND SENTINEL LYMPH NODE BIOPSY Left 02/18/2018   Procedure: LEFT BREAST LUMPECTOMY WITH RADIOACTIVE SEED AND LEFT AXILLARY DEEP SENTINEL LYMPH NODE BIOPSY, INJECT BLUE DYE LEFT BREAST;  Surgeon: Fanny Skates, MD;  Location: [place];  Service:  General;  Laterality: Left;  . PORT-A-CATH REMOVAL N/A 03/17/2019   Procedure: REMOVAL PORT-A-CATH;  Surgeon: Fanny Skates, MD;  Location: [facility];  Service: General;  Laterality: N/A;  . PORTACATH PLACEMENT Right 02/18/2018   Procedure: INSERTION PORT-A-CATH;  Surgeon: Fanny Skates, MD;  Location: [facility];  Service: General;  Laterality: Right;  . Right torn meniscus repair x 2    . THYROID LOBECTOMY N/A 02/04/2015   Procedure: TOTAL THYROIDECTOMY;  Surgeon: Armandina Gemma, MD;  Location: [facility];  Service: General;  Laterality: N/A;  . TONSILLECTOMY    . TUMOR REMOVAL    . Tumors on arms removed,  Tumor on gum removed,  Tumor removed from inside ear      FAMILY HISTORY Family History  Problem Relation Age of Onset  . Ovarian cancer Sister   . Lung cancer Sister   . Throat cancer Brother   . Brain cancer Sister    She notes that her father died from old age at age 61. Patients' mother died from multiple comorbidities at age 8. The patient has 5 brothers and 6 sisters. Patients' sister had ovarian cancer at age 82 and lung cancer at 67, brother with throat cancer at age 74, and sister with brain cancer at age 18.    GYNECOLOGIC HISTORY:  No LMP recorded. Patient has had a hysterectomy. 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 Her husband was a Librarian, academic at [employer].  He was an Civil Service fast streamer who developed supranuclear palsy and died early in the morning of 12-21-2019. Her son, Mallika Sanmiguel, manages a call center for medical supplies. Her daughter, [name], is Patient Care surgical coordinator at [employer] clinic.  The patient has 2 grandchildren. She attends [employer].     ADVANCED DIRECTIVES: Her [place] is her daughter, [name] and can be reached at [phone].     HEALTH MAINTENANCE: Social History   Tobacco Use  . Smoking status: Former Smoker    Packs/day: 1.50    Types: Cigarettes  . Smokeless tobacco: Never Used  Substance Use Topics  . Alcohol use: No  . Drug use: No     Colonoscopy: Yes through [facility]  PAP:   Bone density:    Allergies  Allergen Reactions  . Allegra [Fexofenadine] Hives  . Clindamycin/Lincomycin Hives and Diarrhea  . Influenza A (H1n1) Monoval Vac Other (See Comments)    Guillian Barre Syndrome  . Keflex [Cephalexin] Hives  . Penicillins Swelling    Has patient had a PCN reaction causing immediate rash, facial/tongue/throat swelling, SOB or lightheadedness with hypotension: No Has patient had a PCN reaction causing severe rash involving mucus membranes or skin necrosis: No Has patient had a PCN reaction that required hospitalization No Has patient had a PCN reaction occurring within the last 10 years: No If all of the above answers are "NO", then may proceed with Cephalosporin use.   . Prednisone Other (See  Comments)    "heart attack symptoms" but even had heart cath & had no cardiac disease  . Protonix [Pantoprazole Sodium] Diarrhea    Tolerates Prilosec  . Statins Other (See Comments)    myalgias    Current Outpatient Medications  Medication Sig Dispense Refill  . amLODipine (NORVASC) 5 MG tablet Take 1 tablet (5 mg total) by mouth daily. 30 tablet 0  . anastrozole (ARIMIDEX) 1 MG tablet Take 1 tablet (1 mg total) by mouth daily. 90 tablet 4  . Cholecalciferol (VITAMIN D) 2000 UNITS tablet Take 2,000 Units by mouth daily.    . clobetasol cream (TEMOVATE) 0.05 % APPLY TO AFFECTED AREA TWICE A DAY 30 g 0  . diclofenac sodium (VOLTAREN) 1 % GEL Apply 4 g topically 4 (four) times daily as needed (arthritis pain).     . hyoscyamine (LEVBID) 0.375 MG 12 hr tablet Take 0.375 mg by  mouth every 12 (twelve) hours as needed (blaader spasms).    Marland Kitchen ketoconazole (NIZORAL) 2 % cream Apply 1 application topically daily. 15 g 0  . levothyroxine (SYNTHROID, LEVOTHROID) 100 MCG tablet Take 100 mcg by mouth daily.    Marland Kitchen omeprazole (PRILOSEC) 40 MG capsule TAKE 1 CAPSULE EVERY DAY 90 capsule 3  . prochlorperazine (COMPAZINE) 10 MG tablet TAKE 1 TABLET EVERY 6 HOUR AS NEEDED FOR NAUSEA    . saccharomyces boulardii (FLORASTOR) 250 MG capsule Take 250 mg by mouth daily as needed (only when on antibiotic).     No current facility-administered medications for this visit.    OBJECTIVE: Margaret Hodges woman who appears stated age 48:   12/17/19 1355  BP: 139/69  Pulse: 91  Resp: 18  Temp: (!) 97 F (36.1 C)  SpO2: 99%     Body mass index is 25.39 kg/m.   Wt Readings from Last 3 Encounters:  12/17/19 167 lb (75.8 kg)  05/27/19 170 lb 14.4 oz (77.5 kg)  03/17/19 171 lb 4.8 oz (77.7 kg)      ECOG FS:1 - Symptomatic but completely ambulatory  Sclerae unicteric, EOMs intact Wearing a mask No cervical or supraclavicular adenopathy Lungs no rales or rhonchi Heart regular rate and rhythm Abd soft, nontender, positive bowel sounds MSK no focal spinal tenderness, no upper extremity lymphedema Neuro: nonfocal, well oriented, appropriate affect Breasts: The right breast is unremarkable.  The left breast is undergone lumpectomy followed by radiation.  There are the expected changes from radiation including some coarsening of the skin and slight change in breast contour.  There is no evidence of local recurrence.  Both axillae are benign.  LAB RESULTS:  CMP     Component Value Date/Time   NA 139 12/17/2019 1323   K 4.2 12/17/2019 1323   CL 107 12/17/2019 1323   CO2 28 12/17/2019 1323   GLUCOSE 100 (H) 12/17/2019 1323   BUN 10 12/17/2019 1323   CREATININE 0.77 12/17/2019 1323   CREATININE 0.82 08/23/2018 0828   CALCIUM 9.9 12/17/2019 1323   PROT 7.7 12/17/2019 1323   ALBUMIN 4.1  12/17/2019 1323   AST 15 12/17/2019 1323   AST 15 08/23/2018 0828   ALT 20 12/17/2019 1323   ALT 21 08/23/2018 0828   ALKPHOS 91 12/17/2019 1323   BILITOT 0.4 12/17/2019 1323   BILITOT 0.3 08/23/2018 0828   GFRNONAA >60 12/17/2019 1323   GFRNONAA >60 08/23/2018 0828   GFRAA >60 12/17/2019 1323   GFRAA >60 08/23/2018 0828    Lab Results  Component Value Date  TOTALPROTELP 7.5 05/08/2017    No results found for: KPAFRELGTCHN, LAMBDASER, [facility]  Lab Results  Component Value Date   WBC 12.5 (H) 12/17/2019   NEUTROABS 8.7 (H) 12/17/2019   HGB 14.5 12/17/2019   HCT 43.4 12/17/2019   MCV 99.1 12/17/2019   PLT 306 12/17/2019   No results found for: LABCA2  No components found for: HYWVPX106  No results for input(s): INR in the last 168 hours.  No results found for: LABCA2  No results found for: YIR485  No results found for: IOE703  No results found for: JKK938  No results found for: CA2729  No components found for: HGQUANT  No results found for: CEA1 / No results found for: CEA1   No results found for: AFPTUMOR  No results found for: CHROMOGRNA  No results found for: HGBA, HGBA2QUANT, HGBFQUANT, HGBSQUAN (Hemoglobinopathy evaluation)   Lab Results  Component Value Date   LDH 171 05/08/2017    No results found for: IRON, TIBC, IRONPCTSAT (Iron and TIBC)  No results found for: FERRITIN  Urinalysis    Component Value Date/Time   COLORURINE YELLOW 04/04/2014 2112   APPEARANCEUR CLEAR 04/04/2014 2112   LABSPEC 1.017 04/04/2014 2112   PHURINE 6.0 04/04/2014 2112   [place] 04/04/2014 2112   [place] NEGATIVE 04/04/2014 2112   [place] NEGATIVE 04/04/2014 2112   [place] NEGATIVE 04/04/2014 2112   PROTEINUR NEGATIVE 04/04/2014 2112   UROBILINOGEN 0.2 04/04/2014 2112   NITRITE NEGATIVE 04/04/2014 2112   LEUKOCYTESUR NEGATIVE 04/04/2014 2112    STUDIES:  No results found.    ELIGIBLE FOR AVAILABLE RESEARCH PROTOCOL:  no   ASSESSMENT: 74 y.o. [place] woman status post left breast lower inner quadrant biopsy 01/16/2018 for a clinical T1c N0, stage IA invasive ductal carcinoma, grade 3, estrogen receptor weakly positive, progesterone receptor negative, with an MIB-1 of 80%, and HER-2 amplified  (1) genetics 03/06/2018 showed no deleterious mutations  (2) status post left lumpectomy and sentinel lymph node sampling 02/18/2018 for a pT2 pN0, stage IIA invasive ductal carcinoma, grade 3, with negative margins.  (a) a total of 5 lymph nodes were removed   (3) adjuvant chemo immunotherapy consisting of paclitaxel/trastuzumab weekly for 12 weeks, started 03/08/2018.  (a) paclitaxel discontinued after 6 doses because of peripheral neuropathy; last dose 04/12/2018  (4) trastuzumab continued to complete a year (last dose 02/25/2019)  (a) echocardiogram 02/06/2018 shows an ejection fraction in the 60-65% range  (b) echocardiogram 06/06/2018 shows an ejection fraction in the 60-65% range  (c) echocardiogram 09/09/2018 shows an ejection fraction in the 60-65% range  (d) echocardiogram 12/16/2018 shows an ejection fraction in the 60-65% range  (5) adjuvant radiation 05/30/2018 - 06/26/2018  (a) Left Breast / 42.56 Gy in 16 fractions  (b) Boost / 8 Gy in 4 fractions  (6) anastrozole started June 2020  (a) bone density at [facility] 12/27/2017 shows a T score of -1.7  (7) tobacco abuse disorder: [place] stopped smoking 06/26/2018 but later resumed   PLAN: Margaret Hodges is now nearly 2 years out from definitive surgery for her breast cancer with no evidence of disease recurrence.  This is favorable.  We again reviewed the possible side effects toxicities and complications of anastrozole.  She had some confusion between that on tamoxifen.  I reassured her that this does not cause blood clots or strokes, does not cause weight gain and does not cause insomnia.  It does cause in many patients hot flashes, vaginal  dryness, and can cause arthralgias or  myalgias.  However she had none of those symptoms while taking the medication.  She understands the benefit is that it cuts the risk of breast cancer between 40 and 50%.  She will have mammography and bone density November of this year at [facility].  She is going to see me again in May 2022.  I encouraged her to cut back on her smoking and if possible to quit completely.  Total encounter time 30 minutes.*  Xan Sparkman, Virgie Dad, MD  12/17/19 2:19 PM Medical Oncology and Hematology [facility] [place], [place] [address] Tel. [phone]    Fax. [phone]   I, Wilburn Mylar, am acting as scribe for Dr. Virgie Dad. Hansen Carino.  I, Lurline Del MD, have reviewed the above documentation for accuracy and completeness, and I agree with the above.   *Total Encounter Time as defined by the Centers for Medicare and Medicaid Services includes, in addition to the face-to-face time of a patient visit (documented in the note above) non-face-to-face time: obtaining and reviewing outside history, ordering and reviewing medications, tests or procedures, care coordination (communications with other health care professionals or caregivers) and documentation in the medical record.

## 2019-12-18 ENCOUNTER — Telehealth: Payer: Self-pay | Admitting: Oncology

## 2019-12-18 NOTE — Telephone Encounter (Signed)
Called and spoke with pt, confirmed 5/5 appt date and time

## 2020-01-15 ENCOUNTER — Other Ambulatory Visit: Payer: Self-pay | Admitting: Oncology

## 2020-01-16 ENCOUNTER — Encounter: Payer: Self-pay | Admitting: Oncology

## 2020-02-09 DIAGNOSIS — N3000 Acute cystitis without hematuria: Secondary | ICD-10-CM | POA: Diagnosis not present

## 2020-03-02 DIAGNOSIS — C50312 Malignant neoplasm of lower-inner quadrant of left female breast: Secondary | ICD-10-CM | POA: Diagnosis not present

## 2020-04-07 ENCOUNTER — Other Ambulatory Visit: Payer: Self-pay | Admitting: Family Medicine

## 2020-04-07 DIAGNOSIS — Z87891 Personal history of nicotine dependence: Secondary | ICD-10-CM

## 2020-04-08 ENCOUNTER — Telehealth: Payer: Self-pay

## 2020-04-08 DIAGNOSIS — R350 Frequency of micturition: Secondary | ICD-10-CM | POA: Diagnosis not present

## 2020-04-08 NOTE — Telephone Encounter (Signed)
NOTES ON FILE FROM [facility] [id number] SENT REFERRAL TO SCHEDULING 

## 2020-04-23 ENCOUNTER — Ambulatory Visit
Admission: RE | Admit: 2020-04-23 | Discharge: 2020-04-23 | Disposition: A | Discharge status: Home / Self Care | Payer: Medicare Other | Source: Ambulatory Visit | Attending: Family Medicine | Admitting: Family Medicine

## 2020-04-23 ENCOUNTER — Other Ambulatory Visit: Payer: Self-pay

## 2020-04-23 DIAGNOSIS — J701 Chronic and other pulmonary manifestations due to radiation: Secondary | ICD-10-CM | POA: Diagnosis not present

## 2020-04-23 DIAGNOSIS — Z87891 Personal history of nicotine dependence: Secondary | ICD-10-CM

## 2020-04-23 DIAGNOSIS — F17210 Nicotine dependence, cigarettes, uncomplicated: Secondary | ICD-10-CM | POA: Diagnosis not present

## 2020-04-23 DIAGNOSIS — Z853 Personal history of malignant neoplasm of breast: Secondary | ICD-10-CM | POA: Diagnosis not present

## 2020-04-23 DIAGNOSIS — J432 Centrilobular emphysema: Secondary | ICD-10-CM | POA: Diagnosis not present

## 2020-04-30 DIAGNOSIS — G894 Chronic pain syndrome: Secondary | ICD-10-CM | POA: Diagnosis not present

## 2020-04-30 DIAGNOSIS — I10 Essential (primary) hypertension: Secondary | ICD-10-CM | POA: Diagnosis not present

## 2020-04-30 DIAGNOSIS — E559 Vitamin D deficiency, unspecified: Secondary | ICD-10-CM | POA: Diagnosis not present

## 2020-04-30 DIAGNOSIS — N3010 Interstitial cystitis (chronic) without hematuria: Secondary | ICD-10-CM | POA: Diagnosis not present

## 2020-04-30 DIAGNOSIS — Z79899 Other long term (current) drug therapy: Secondary | ICD-10-CM | POA: Diagnosis not present

## 2020-04-30 DIAGNOSIS — J309 Allergic rhinitis, unspecified: Secondary | ICD-10-CM | POA: Diagnosis not present

## 2020-04-30 DIAGNOSIS — K219 Gastro-esophageal reflux disease without esophagitis: Secondary | ICD-10-CM | POA: Diagnosis not present

## 2020-04-30 DIAGNOSIS — J449 Chronic obstructive pulmonary disease, unspecified: Secondary | ICD-10-CM | POA: Diagnosis not present

## 2020-04-30 DIAGNOSIS — M1990 Unspecified osteoarthritis, unspecified site: Secondary | ICD-10-CM | POA: Diagnosis not present

## 2020-04-30 DIAGNOSIS — E538 Deficiency of other specified B group vitamins: Secondary | ICD-10-CM | POA: Diagnosis not present

## 2020-04-30 DIAGNOSIS — E039 Hypothyroidism, unspecified: Secondary | ICD-10-CM | POA: Diagnosis not present

## 2020-04-30 DIAGNOSIS — Z0000 Encounter for general adult medical examination without abnormal findings: Secondary | ICD-10-CM | POA: Diagnosis not present

## 2020-05-05 ENCOUNTER — Ambulatory Visit (HOSPITAL_COMMUNITY)
Admission: RE | Admit: 2020-05-05 | Discharge: 2020-05-05 | Disposition: A | Discharge status: Home / Self Care | Payer: Medicare Other | Source: Ambulatory Visit | Attending: Family Medicine | Admitting: Family Medicine

## 2020-05-05 ENCOUNTER — Other Ambulatory Visit: Payer: Self-pay

## 2020-05-05 VITALS — BP 141/91 | HR 73 | Wt 166.6 lb

## 2020-05-05 DIAGNOSIS — R0789 Other chest pain: Secondary | ICD-10-CM | POA: Diagnosis not present

## 2020-05-05 DIAGNOSIS — Z79899 Other long term (current) drug therapy: Secondary | ICD-10-CM | POA: Diagnosis not present

## 2020-05-05 DIAGNOSIS — C50912 Malignant neoplasm of unspecified site of left female breast: Secondary | ICD-10-CM | POA: Diagnosis not present

## 2020-05-05 DIAGNOSIS — E781 Pure hyperglyceridemia: Secondary | ICD-10-CM | POA: Diagnosis not present

## 2020-05-05 DIAGNOSIS — Z636 Dependent relative needing care at home: Secondary | ICD-10-CM | POA: Diagnosis not present

## 2020-05-05 DIAGNOSIS — Z888 Allergy status to other drugs, medicaments and biological substances status: Secondary | ICD-10-CM | POA: Insufficient documentation

## 2020-05-05 DIAGNOSIS — Z887 Allergy status to serum and vaccine status: Secondary | ICD-10-CM | POA: Diagnosis not present

## 2020-05-05 DIAGNOSIS — Z79890 Hormone replacement therapy (postmenopausal): Secondary | ICD-10-CM | POA: Insufficient documentation

## 2020-05-05 DIAGNOSIS — Z5181 Encounter for therapeutic drug level monitoring: Secondary | ICD-10-CM | POA: Diagnosis not present

## 2020-05-05 DIAGNOSIS — Z881 Allergy status to other antibiotic agents status: Secondary | ICD-10-CM | POA: Diagnosis not present

## 2020-05-05 DIAGNOSIS — Z170 Estrogen receptor positive status [ER+]: Secondary | ICD-10-CM | POA: Diagnosis not present

## 2020-05-05 DIAGNOSIS — J449 Chronic obstructive pulmonary disease, unspecified: Secondary | ICD-10-CM | POA: Diagnosis not present

## 2020-05-05 DIAGNOSIS — Z791 Long term (current) use of non-steroidal anti-inflammatories (NSAID): Secondary | ICD-10-CM | POA: Insufficient documentation

## 2020-05-05 DIAGNOSIS — Z87891 Personal history of nicotine dependence: Secondary | ICD-10-CM | POA: Insufficient documentation

## 2020-05-05 DIAGNOSIS — G610 Guillain-Barre syndrome: Secondary | ICD-10-CM | POA: Insufficient documentation

## 2020-05-05 DIAGNOSIS — Z880 Allergy status to penicillin: Secondary | ICD-10-CM | POA: Diagnosis not present

## 2020-05-05 DIAGNOSIS — I10 Essential (primary) hypertension: Secondary | ICD-10-CM

## 2020-05-05 NOTE — Progress Notes (Signed)
Cardio-Oncology Clinic Note   Referring Physician: Dr. Jana Hakim Primary Care: [name], MD HF Cardiologist: Dr. Haroldine Laws  HPI:  Margaret Hodges is a 75 y.o. female with past medical history of COPD with ongoing tobacco use, HTN, Guillane Barre syndrome and left breast cancer who has been referred by Dr. Jana Hakim to establish in the cardio-oncology clinic for monitoring of cardio-toxicity while undergoing chemotherapy.  Oncologic history 75 y.o. [place] woman status post left breast lower inner quadrant biopsy 01/16/2018 for a clinical T1c N0, stage IA invasive ductal carcinoma, grade 3, estrogen receptor weakly positive, progesterone receptor negative, with an MIB-1 of 80%, and HER-2 amplified  (1) Genetics 03/06/2018 showed no deleterious mutations  (2) Status post left lumpectomy and sentinel lymph node sampling 02/18/2018 for a pT2 pN0, stage IIA invasive ductal carcinoma, grade 3, with negative margins.             (a) a total of 5 lymph nodes were removed   (3) Adjuvant chemo immunotherapy consisting of paclitaxel/trastuzumab weekly for 12 weeks, started 03/08/2018.             (a) paclitaxel discontinued after 6 doses because of peripheral neuropathy; last dose 04/12/2018  (4) Trastuzumab to be continued to complete a year  (5) Adjuvant radiation started 05/31/18 and completed  (6) Aastrozole started June 2020             (a) bone density at [facility] 12/27/2017 shows a T score of -1.7  She returned 10/20 for routine f/u. Doing OK. Tolerating herceptin fairly well except for some leg pains, fatigue and mouth ulcers. Will finish 02/25/19. Still taking care of her husband with SNP. Smoking < 1ppd .Stays realtively active. Does all her own housework and works out in the yard. Walks up and down steps all day long.   Today she returns for HF follow up. Started having chest pains and heaviness that radiate to the back. Has been on-going for last 2-3 months,  worsened over the last couple weeks. Sharp pains in middle of chest after meals, PPI dose increased and symptoms better. Heaviness is not associated with activity, no sweating, nausea or pain in arm or jaw. Very active around the house, does yard work, Overall feeling fine. Denies increasing SOB, dizziness, edema, or PND/Orthopnea. Appetite ok. No fever or chills. Weight at home 170 pounds. Taking all medications.   Cardiac Studies: Echo 12/16/18: EF 60-65% Grade 1 DD Personally reviewed Echo 09/09/18: EF 60-65% Echo 06/06/18: 60-65%,  Echo 02/06/2018: LVEF 60-65%, GLS -22.1%, No AI, No MR.   Past Medical History:  Diagnosis Date  . Anginal pain ([place])    with admission to hospital due to high blood pressure  . Complication of anesthesia   . Family history of ovarian cancer   . GERD (gastroesophageal reflux disease)   . Guillain Barr syndrome ([place]) 1959  . Guillain Barr syndrome ([place]) 1959  . Guillain Barr syndrome ([place]) 1959  . Headache    sinus headaches  . History of hiatal hernia    was told pt. had one, but no testing  . Hypertensive urgency 04/05/2014  . Malignant neoplasm of lower-inner quadrant of left breast in female, estrogen receptor positive ([place]) 01/24/2018  . Neuromuscular disorder ([place])    Guillian- Barre Syndrome  . Pneumonia    hx. of walking pneumonia  . PONV (postoperative nausea and vomiting)    Current Outpatient Medications  Medication Sig Dispense Refill  . amLODipine (NORVASC) 5 MG tablet Take  1 tablet (5 mg total) by mouth daily. 30 tablet 0  . Cholecalciferol (VITAMIN D) 2000 UNITS tablet Take 2,000 Units by mouth daily.    . diclofenac (VOLTAREN) 0.1 % ophthalmic solution 4 (four) times daily.    . famotidine (PEPCID) 20 MG tablet Take 20 mg by mouth as needed for heartburn or indigestion.    Marland Kitchen HYDROcodone-acetaminophen (NORCO) 10-325 MG tablet Take 1 tablet by mouth every 6 (six) hours as needed.    . hyoscyamine (LEVBID) 0.375 MG 12 hr tablet Take  0.375 mg by mouth every 12 (twelve) hours as needed (blaader spasms).    Marland Kitchen levothyroxine (SYNTHROID, LEVOTHROID) 100 MCG tablet Take 100 mcg by mouth daily.    Marland Kitchen omeprazole (PRILOSEC) 40 MG capsule TAKE 1 CAPSULE EVERY DAY 90 capsule 3  . vitamin B-12 (CYANOCOBALAMIN) 1000 MCG tablet Take 1,000 mcg by mouth daily.     No current facility-administered medications for this encounter.   Allergies  Allergen Reactions  . Allegra [Fexofenadine] Hives  . Clindamycin/Lincomycin Hives and Diarrhea  . Influenza A (H1n1) Monoval Vac Other (See Comments)    Guillian Barre Syndrome  . Keflex [Cephalexin] Hives  . Penicillins Swelling    Has patient had a PCN reaction causing immediate rash, facial/tongue/throat swelling, SOB or lightheadedness with hypotension: No Has patient had a PCN reaction causing severe rash involving mucus membranes or skin necrosis: No Has patient had a PCN reaction that required hospitalization No Has patient had a PCN reaction occurring within the last 10 years: No If all of the above answers are "NO", then may proceed with Cephalosporin use.   . Prednisone Other (See Comments)    "heart attack symptoms" but even had heart cath & had no cardiac disease  . Statins Other (See Comments)    myalgias   Social History   Socioeconomic History  . Marital status: Married    Spouse name: Not on file  . Number of children: Not on file  . Years of education: Not on file  . Highest education level: Not on file  Occupational History  . Not on file  Tobacco Use  . Smoking status: Former Smoker    Packs/day: 1.50    Types: Cigarettes  . Smokeless tobacco: Never Used  Substance and Sexual Activity  . Alcohol use: No  . Drug use: No  . Sexual activity: Not Currently  Other Topics Concern  . Not on file  Social History Narrative  . Not on file   Social Determinants of Health   Financial Resource Strain: Not on file  Food Insecurity: Not on file  Transportation Needs:  Not on file  Physical Activity: Not on file  Stress: Not on file  Social Connections: Not on file  Intimate Partner Violence: Not on file    Family History  Problem Relation Age of Onset  . Ovarian cancer Sister   . Lung cancer Sister   . Throat cancer Brother   . Brain cancer Sister    Vitals:   05/05/20 1400  BP: (!) 141/91  Pulse: 73  SpO2: 97%  Weight: 75.6 kg (166 lb 9.6 oz)     Wt Readings from Last 3 Encounters:  05/05/20 75.6 kg (166 lb 9.6 oz)  12/17/19 75.8 kg (167 lb)  05/27/19 77.5 kg (170 lb 14.4 oz)    PHYSICAL EXAM: General:  NAD. No resp difficulty HEENT: Normal Neck: Supple. No JVD. Carotids 2+ bilat; no bruits. No lymphadenopathy or thryomegaly appreciated. Cor: PMI nondisplaced.  Regular rate & rhythm. No rubs, gallops or murmurs. Lungs: Clear Abdomen: Soft, nontender, nondistended. No hepatosplenomegaly. No bruits or masses. Good bowel sounds. Extremities: No cyanosis, clubbing, rash, edema Neuro: alert & oriented x 3, cranial nerves grossly intact. Moves all 4 extremities w/o difficulty. Affect pleasant.  ECG: SR 77 bpm, no change from prior (personally reviewed).  ASSESSMENT & PLAN:  1. Chest pressure - LHC 2012 with Dr. Irish Lack showed no CAD. - Stress test 2016 negative. - Echo (12/16/18): EF 60-65% Grade 1 DD.  - She is euvolemic on exam. She has quit smoking. - Discussed testing options LHC vs stress echo; she would like to proceed with least invasive. - Will schedule stress echo.  2. Left breast cancer - Invasive ductal carcinoma, grade III. Prognostic indicators significant for: estrogen receptor, 60% positive, with weak staining intensity; progesterone receptor negative, Proliferation marker Ki67 at 80%. HER2 positive by immunohistochemistry, 3+. - I reviewed echos personally. EF and Doppler parameters stable. No HF on exam. Continue Herceptin.  - She only has 4 more treatments left. I think we can forego the last echo given COVID  concerns for her and her husband.  3. Tobacco abuse - Has quit.  4. HTN - Continue amlodipine. Can increase as needed.  5. Hypertriglyceridemia - Followed by PCP  Follow up pending stress echo results  Rafael Bihari, FNP-BC 05/05/20   75 y/o woman with h/o breast CA, previous tobacco use, HTN. Here with recurrent CP. Pain intermittent. No clear predictable triggers. Has been able to exercise without worsening symptoms. Cath 20 years ago with no CAD. ECG ok   General:  Well appearing. No resp difficulty HEENT: normal Neck: supple. no JVD. Carotids 2+ bilat; no bruits. No lymphadenopathy or thryomegaly appreciated. Cor: PMI nondisplaced. Regular rate & rhythm. No rubs, gallops or murmurs. Lungs: clear Abdomen: soft, nontender, nondistended. No hepatosplenomegaly. No bruits or masses. Good bowel sounds. Extremities: no cyanosis, clubbing, rash, edema Neuro: alert & orientedx3, cranial nerves grossly intact. moves all 4 extremities w/o difficulty. Affect pleasant  CP mostly atypical but has some typical features. We discussed options to evaluate including repeat cath.  Wil paln stress echo to further evaluate.   Glori Bickers, MD  12:00 AM

## 2020-05-05 NOTE — Patient Instructions (Signed)
Your physician has requested that you have a stress echocardiogram. For further information please visit [email or web address]. Please follow instruction sheet as given.  Your physician recommends that you schedule a follow up appointment as needed  If you have any questions or concerns before your next appointment please send [place] a message through [place] or call our office at [phone].    TO LEAVE A MESSAGE FOR THE NURSE SELECT OPTION 2, PLEASE LEAVE A MESSAGE INCLUDING: . YOUR NAME . DATE OF BIRTH . CALL BACK NUMBER . REASON FOR CALL**this is important as we prioritize the call backs  [place] AS LONG AS YOU CALL BEFORE 4:00 PM  At the [facility], you and your health needs are our priority. As part of our continuing mission to provide you with exceptional heart care, we have created designated Provider Care Teams. These Care Teams include your primary Cardiologist (physician) and Advanced Practice Providers (APPs- Physician Assistants and Nurse Practitioners) who all work together to provide you with the care you need, when you need it.   You may see any of the following providers on your designated Care Team at your next follow up: Marland Kitchen Dr Glori Bickers . Dr Loralie Champagne . Darrick Grinder, NP . Lyda Jester, [place] . Audry Riles, PharmD   Please be sure to bring in all your medications bottles to every appointment.

## 2020-05-07 NOTE — Addendum Note (Signed)
Encounter addended by: Kerry Dory, CMA on: 05/07/2020 2:39 PM  Actions taken: Order list changed, Diagnosis association updated

## 2020-05-12 ENCOUNTER — Other Ambulatory Visit (HOSPITAL_COMMUNITY): Payer: Self-pay | Admitting: Internal Medicine

## 2020-05-24 ENCOUNTER — Telehealth (HOSPITAL_COMMUNITY): Payer: Self-pay | Admitting: *Deleted

## 2020-05-24 ENCOUNTER — Other Ambulatory Visit (HOSPITAL_COMMUNITY)
Admission: RE | Admit: 2020-05-24 | Discharge: 2020-05-24 | Disposition: A | Discharge status: Home / Self Care | Payer: Medicare Other | Source: Ambulatory Visit | Attending: Internal Medicine | Admitting: Internal Medicine

## 2020-05-24 DIAGNOSIS — Z01812 Encounter for preprocedural laboratory examination: Secondary | ICD-10-CM | POA: Insufficient documentation

## 2020-05-24 DIAGNOSIS — Z20822 Contact with and (suspected) exposure to covid-19: Secondary | ICD-10-CM | POA: Diagnosis not present

## 2020-05-24 LAB — SARS CORONAVIRUS 2 (TAT 6-24 HRS): SARS Coronavirus 2: NEGATIVE

## 2020-05-24 NOTE — Telephone Encounter (Signed)
Patient given instructions for upcoming Stress Echo appointment.  Margaret Hodges

## 2020-05-26 ENCOUNTER — Ambulatory Visit: Payer: Medicare Other | Admitting: Adult Health

## 2020-05-26 ENCOUNTER — Other Ambulatory Visit: Payer: Self-pay

## 2020-05-26 ENCOUNTER — Ambulatory Visit (HOSPITAL_COMMUNITY): Payer: Medicare Other

## 2020-05-26 ENCOUNTER — Ambulatory Visit (HOSPITAL_COMMUNITY): Payer: Medicare Other | Attending: Cardiology

## 2020-05-26 ENCOUNTER — Encounter (HOSPITAL_COMMUNITY): Payer: Self-pay | Admitting: Cardiology

## 2020-05-26 DIAGNOSIS — R0789 Other chest pain: Secondary | ICD-10-CM | POA: Diagnosis not present

## 2020-05-26 LAB — ECHOCARDIOGRAM STRESS TEST: S' Lateral: 2.4 cm

## 2020-05-26 MED ORDER — PERFLUTREN LIPID MICROSPHERE
1.0000 mL | INTRAVENOUS | Status: AC | PRN
  Administered 2020-05-26: 2 mL via INTRAVENOUS

## 2020-05-26 NOTE — Progress Notes (Signed)
Patient ID: Margaret Hodges, female   DOB: 10/31/1945, 75 y.o.   MRN: [id number] Stress echo unable to be performed due to patient's poor image quality. Lorriane Shire brought in to attempt to scan, then Dr Harrington Challenger brought in to verify another test type needed. Dr Harrington Challenger then sent Dr Haroldine Laws a message letting him know alternate test would be needed.

## 2020-06-03 ENCOUNTER — Telehealth (HOSPITAL_COMMUNITY): Payer: Self-pay | Admitting: *Deleted

## 2020-06-03 DIAGNOSIS — R0789 Other chest pain: Secondary | ICD-10-CM

## 2020-06-03 NOTE — Telephone Encounter (Signed)
Pt aware and and agreeable with plan. Order placed. Given to [facility] to schedule.

## 2020-06-03 NOTE — Telephone Encounter (Signed)
Get [employer]

## 2020-06-03 NOTE — Telephone Encounter (Signed)
Pts daughter left VM stating pt was scheduled for a stress echo but during the test they couldn't get good images and was told Dr.Bensimhon would contact her with next step. I did not see any notes from [place] per Adline Potter route message to provider to see if a different test needs to be ordered.

## 2020-06-04 ENCOUNTER — Inpatient Hospital Stay: Payer: Medicare Other

## 2020-06-04 ENCOUNTER — Encounter: Payer: Self-pay | Admitting: Adult Health

## 2020-06-04 ENCOUNTER — Other Ambulatory Visit: Payer: Self-pay | Admitting: Adult Health

## 2020-06-04 ENCOUNTER — Other Ambulatory Visit: Payer: Self-pay

## 2020-06-04 ENCOUNTER — Inpatient Hospital Stay: Payer: Medicare Other | Attending: Adult Health | Admitting: Adult Health

## 2020-06-04 ENCOUNTER — Ambulatory Visit (HOSPITAL_BASED_OUTPATIENT_CLINIC_OR_DEPARTMENT_OTHER)
Admission: RE | Admit: 2020-06-04 | Discharge: 2020-06-04 | Disposition: A | Discharge status: Home / Self Care | Payer: Medicare Other | Source: Ambulatory Visit | Attending: Adult Health | Admitting: Adult Health

## 2020-06-04 VITALS — BP 126/73 | HR 79 | Temp 98.1°F | Resp 18 | Ht 68.0 in | Wt 168.0 lb

## 2020-06-04 DIAGNOSIS — C50312 Malignant neoplasm of lower-inner quadrant of left female breast: Secondary | ICD-10-CM | POA: Insufficient documentation

## 2020-06-04 DIAGNOSIS — Z90710 Acquired absence of both cervix and uterus: Secondary | ICD-10-CM | POA: Insufficient documentation

## 2020-06-04 DIAGNOSIS — Z801 Family history of malignant neoplasm of trachea, bronchus and lung: Secondary | ICD-10-CM | POA: Diagnosis not present

## 2020-06-04 DIAGNOSIS — Z8041 Family history of malignant neoplasm of ovary: Secondary | ICD-10-CM | POA: Insufficient documentation

## 2020-06-04 DIAGNOSIS — Z170 Estrogen receptor positive status [ER+]: Secondary | ICD-10-CM

## 2020-06-04 DIAGNOSIS — Z87891 Personal history of nicotine dependence: Secondary | ICD-10-CM | POA: Insufficient documentation

## 2020-06-04 DIAGNOSIS — Z79899 Other long term (current) drug therapy: Secondary | ICD-10-CM | POA: Diagnosis not present

## 2020-06-04 DIAGNOSIS — G629 Polyneuropathy, unspecified: Secondary | ICD-10-CM | POA: Diagnosis not present

## 2020-06-04 DIAGNOSIS — Z923 Personal history of irradiation: Secondary | ICD-10-CM | POA: Insufficient documentation

## 2020-06-04 LAB — CMP (CANCER CENTER ONLY)
ALT: 20 U/L (ref 0–44)
AST: 15 U/L (ref 15–41)
Albumin: 4.1 g/dL (ref 3.5–5.0)
Alkaline Phosphatase: 85 U/L (ref 38–126)
Anion gap: 7 (ref 5–15)
BUN: 14 mg/dL (ref 8–23)
CO2: 25 mmol/L (ref 22–32)
Calcium: 9.9 mg/dL (ref 8.9–10.3)
Chloride: 107 mmol/L (ref 98–111)
Creatinine: 0.81 mg/dL (ref 0.44–1.00)
GFR, Estimated: >60 mL/min (ref >60)
Glucose, Bld: 94 mg/dL (ref 70–99)
Potassium: 4.7 mmol/L (ref 3.5–5.1)
Sodium: 139 mmol/L (ref 135–145)
Total Bilirubin: 0.4 mg/dL (ref 0.3–1.2)
Total Protein: 7.5 g/dL (ref 6.5–8.1)

## 2020-06-04 LAB — CBC WITH DIFFERENTIAL (CANCER CENTER ONLY)
Abs Immature Granulocytes: 0.04 10*3/uL (ref 0.00–0.07)
Basophils Absolute: 0.1 10*3/uL (ref 0.0–0.1)
Basophils Relative: 1 %
Eosinophils Absolute: 0.2 10*3/uL (ref 0.0–0.5)
Eosinophils Relative: 2 %
HCT: 43.4 % (ref 36.0–46.0)
Hemoglobin: 14.5 g/dL (ref 12.0–15.0)
Immature Granulocytes: 0 %
Lymphocytes Relative: 20 %
Lymphs Abs: 2.1 10*3/uL (ref 0.7–4.0)
MCH: 33.3 pg (ref 26.0–34.0)
MCHC: 33.4 g/dL (ref 30.0–36.0)
MCV: 99.8 fL (ref 80.0–100.0)
Monocytes Absolute: 1 10*3/uL (ref 0.1–1.0)
Monocytes Relative: 10 %
Neutro Abs: 6.9 10*3/uL (ref 1.7–7.7)
Neutrophils Relative %: 67 %
Platelet Count: 292 10*3/uL (ref 150–400)
RBC: 4.35 MIL/uL (ref 3.87–5.11)
RDW: 13.2 % (ref 11.5–15.5)
WBC Count: 10.2 10*3/uL (ref 4.0–10.5)
nRBC: 0 % (ref 0.0–0.2)

## 2020-06-04 LAB — MAGNESIUM: Magnesium: 2 mg/dL (ref 1.7–2.4)

## 2020-06-04 LAB — D-DIMER, QUANTITATIVE: D-Dimer, Quant: 0.82 ug/mL-FEU — ABNORMAL HIGH (ref 0.00–0.50)

## 2020-06-04 NOTE — Progress Notes (Signed)
[facility]  [phone] [phone]     ID: Margaret Hodges DOB: 1945-06-14  MR#: [id number]  BZM#:[id number]  Patient Care Team: [name], MD as PCP - General (Family Medicine) Fanny Skates, MD as Consulting Physician (General Surgery) Magrinat, Virgie Dad, MD as Consulting Physician (Oncology) Kyung Rudd, MD as Consulting Physician (Radiation Oncology) Allyn Kenner, MD (Dermatology) Larey Dresser, MD as Consulting Physician (Cardiology) Bensimhon, Shaune Pascal, MD as Consulting Physician (Cardiology) OTHER MD:   CHIEF COMPLAINT: Estrogen receptor positive breast cancer  CURRENT TREATMENT: observation   INTERVAL HISTORY: Margaret Hodges returns today for follow-up of her estrogen receptor positive breast cancer.    She started on anastrozole and ended up taking it 3 times a week.  She had no side effects other than mild hot flashes.  However she kept reading the possible side effects got spooked and stopped.  Since her last visit, she underwent port removal on 03/17/2019.  Recall she completed her trastuzumab treatments December 2020.  Bone density on 01/16/2020 showed a T score of -1.7 in the right hip.  Mammogram was completed on 01/16/2020 and showed no evidence of malignancy and breast density category B.    REVIEW OF SYSTEMS: She has a new pain on her right inner thigh that has been present x 1 year.  She is nervous that she may have a blood clot.  This pain is intermittent with no specific pattern.  She has no precipitating event.  She denies any alleviating or aggravating factors.  She describes it as an ache or a throb, at its worst it has been a 6/10.  She notes that it lasts for about a few hours, and happens infrequently about every day or every other day.    She denies any other issues today.  She is up to date with her PCP visits.  She is exercising and active.  She has graduated from colon and gyn cancer screening.  She sees Dr. [place] Crane when  she has a skin concern, who is her dermatologist.  Otherwise, a detailed ROS was non contributory.    HISTORY OF CURRENT ILLNESS: From the original intake note:  Margaret Hodges had routine screening mammography on 01/03/2018 showing a possible abnormality in the left breast. She underwent bilateral diagnostic mammography with tomography and left breast ultrasonography at [facility] on 01/15/2018 showing: Breast density category B; a 1.3 cm round solid mass with an indistinct margin in the left breast at 7 o'clock posterior 8 cm from the nipple. No significant abnormalities found in the left axilla.  Accordingly on 01/16/2018 she proceeded to biopsy of the left breast area in question. The pathology from this procedure showed [phone]): Invasive ductal carcinoma, grade III. Prognostic indicators significant for: estrogen receptor, 60% positive, with weak staining intensity; progesterone receptor negative, Proliferation marker Ki67 at 80%. HER2 positive by immunohistochemistry, 3+.  The patient's subsequent history is as detailed below.   PAST MEDICAL HISTORY: Past Medical History:  Diagnosis Date  . Anginal pain ([place])    with admission to hospital due to high blood pressure  . Complication of anesthesia   . Family history of ovarian cancer   . GERD (gastroesophageal reflux disease)   . Guillain Barr syndrome ([place]) 1959  . Guillain Barr syndrome ([place]) 1959  . Guillain Barr syndrome ([place]) 1959  . Headache    sinus headaches  . History of hiatal hernia    was told pt. had one, but no testing  . Hypertensive urgency  04/05/2014  . Malignant neoplasm of lower-inner quadrant of left breast in female, estrogen receptor positive ([place]) 01/24/2018  . Neuromuscular disorder ([place])    Guillian- Barre Syndrome  . Pneumonia    hx. of walking pneumonia  . PONV (postoperative nausea and vomiting)     PAST SURGICAL HISTORY: Past Surgical History:  Procedure Laterality Date  . ABDOMINAL  HYSTERECTOMY     Total  . Bladder lift  15 years ago,  Uterus prolapse causing intestinal damage and surgery    . BREAST LUMPECTOMY Left 02/18/2018  . BREAST LUMPECTOMY WITH RADIOACTIVE SEED AND SENTINEL LYMPH NODE BIOPSY Left 02/18/2018   Procedure: LEFT BREAST LUMPECTOMY WITH RADIOACTIVE SEED AND LEFT AXILLARY DEEP SENTINEL LYMPH NODE BIOPSY, INJECT BLUE DYE LEFT BREAST;  Surgeon: Fanny Skates, MD;  Location: [facility];  Service: General;  Laterality: Left;  . PORT-A-CATH REMOVAL N/A 03/17/2019   Procedure: REMOVAL PORT-A-CATH;  Surgeon: Fanny Skates, MD;  Location: [facility];  Service: General;  Laterality: N/A;  . PORTACATH PLACEMENT Right 02/18/2018   Procedure: INSERTION PORT-A-CATH;  Surgeon: Fanny Skates, MD;  Location: [facility];  Service: General;  Laterality: Right;  . Right torn meniscus repair x 2    . THYROID LOBECTOMY N/A 02/04/2015   Procedure: TOTAL THYROIDECTOMY;  Surgeon: Armandina Gemma, MD;  Location: [facility];  Service: General;  Laterality: N/A;  . TONSILLECTOMY    . TUMOR REMOVAL    . Tumors on arms removed,  Tumor on gum removed,  Tumor removed from inside ear      FAMILY HISTORY Family History  Problem Relation Age of Onset  . Ovarian cancer Sister   . Lung cancer Sister   . Throat cancer Brother   . Brain cancer Sister    She notes that her father died from old age at age 58. Patients' mother died from multiple comorbidities at age 52. The patient has 5 brothers and 6 sisters. Patients' sister had ovarian cancer at age 29 and lung cancer at 77, brother with throat cancer at age 77, and sister with brain cancer at age 63.    GYNECOLOGIC HISTORY:  No LMP recorded. Patient has had a hysterectomy. 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 Her husband was a Librarian, academic at [employer].  He was an Civil Service fast streamer who developed supranuclear palsy and died early in the morning of 01/03/20. Her son, Mao Lockner, manages a call center for medical supplies. Her daughter, [name], is Patient Care surgical coordinator at [employer] clinic.  The patient has 2 grandchildren. She attends [employer].    ADVANCED DIRECTIVES: Her [place] is her daughter, [name] and can be reached at [phone].     HEALTH MAINTENANCE: Social History   Tobacco Use  . Smoking status: Former Smoker    Packs/day: 1.50    Types: Cigarettes  . Smokeless tobacco: Never Used  Substance Use Topics  . Alcohol use: No  . Drug use: No     Colonoscopy: Yes through [facility]  PAP:   Bone density:    Allergies  Allergen Reactions  . Allegra [Fexofenadine] Hives  . Clindamycin/Lincomycin Hives and Diarrhea  . Influenza A (H1n1) Monoval Vac Other (See  Comments)    Guillian Barre Syndrome  . Keflex [Cephalexin] Hives  . Penicillins Swelling    Has patient had a PCN reaction causing immediate rash, facial/tongue/throat swelling, SOB or lightheadedness with hypotension: No Has patient had a PCN reaction causing severe rash involving mucus membranes or skin necrosis: No Has patient had a PCN reaction that required hospitalization No Has patient had a PCN reaction occurring within the last 10 years: No If all of the above answers are "NO", then may proceed with Cephalosporin use.   . Prednisone Other (See Comments)    "heart attack symptoms" but even had heart cath & had no cardiac disease  . Statins Other (See Comments)    myalgias  . Influenza Vaccines Other (See Comments)  . Other     Other reaction(s): Leg pain Other reaction(s): hives    Current Outpatient Medications  Medication Sig Dispense Refill  . amLODipine (NORVASC) 5  MG tablet Take 1 tablet (5 mg total) by mouth daily. 30 tablet 0  . Cholecalciferol (VITAMIN D) 2000 UNITS tablet Take 2,000 Units by mouth daily.    Marland Kitchen levothyroxine (SYNTHROID, LEVOTHROID) 100 MCG tablet Take 100 mcg by mouth daily.    Marland Kitchen omeprazole (PRILOSEC) 40 MG capsule TAKE 1 CAPSULE EVERY DAY 90 capsule 3  . Probiotic Product (PROBIOTIC-10 ULTIMATE PO) Take by mouth.    . vitamin B-12 (CYANOCOBALAMIN) 1000 MCG tablet Take 1,000 mcg by mouth daily.    . diclofenac (VOLTAREN) 0.1 % ophthalmic solution 4 (four) times daily. (Patient not taking: Reported on 06/04/2020)    . famotidine (PEPCID) 20 MG tablet Take 20 mg by mouth as needed for heartburn or indigestion. (Patient not taking: Reported on 06/04/2020)    . HYDROcodone-acetaminophen (NORCO) 10-325 MG tablet Take 1 tablet by mouth every 6 (six) hours as needed. (Patient not taking: Reported on 06/04/2020)    . hyoscyamine (LEVBID) 0.375 MG 12 hr tablet Take 0.375 mg by mouth every 12 (twelve) hours as needed (blaader spasms). (Patient not taking: Reported on 06/04/2020)     No current facility-administered medications for this visit.    OBJECTIVE: Vitals:   06/04/20 1056  BP: 126/73  Pulse: 79  Resp: 18  Temp: 98.1 F (36.7 C)  SpO2: 99%     Body mass index is 25.54 kg/m.   Wt Readings from Last 3 Encounters:  06/04/20 168 lb (76.2 kg)  05/05/20 166 lb 9.6 oz (75.6 kg)  12/17/19 167 lb (75.8 kg)  ECOG FS:1 - Symptomatic but completely ambulatory GENERAL: Patient is a well appearing female in no acute distress HEENT:  Sclerae anicteric.  Mask in place. Neck is supple.  NODES:  No cervical, supraclavicular, or axillary lymphadenopathy palpated.  BREAST EXAM:  Left breast s/p lumpectomy and radiation, no sign of local recurrence, right breast benign LUNGS:  Clear to auscultation bilaterally.  No wheezes or rhonchi. HEART:  Regular rate and rhythm. No murmur appreciated. ABDOMEN:  Soft, nontender.  Positive, normoactive bowel  sounds. No organomegaly palpated. MSK:  No focal spinal tenderness to palpation. Full range of motion bilaterally in the upper extremities. EXTREMITIES:  No peripheral edema.   SKIN:  Clear with no obvious rashes or skin changes. No nail dyscrasia. NEURO:  Nonfocal. Well oriented.  Appropriate affect.    LAB RESULTS:  CMP     Component Value Date/Time   NA 139 12/17/2019 1323   K 4.2 12/17/2019 1323   CL 107 12/17/2019 1323   CO2 28 12/17/2019 1323  GLUCOSE 100 (H) 12/17/2019 1323   BUN 10 12/17/2019 1323   CREATININE 0.77 12/17/2019 1323   CREATININE 0.82 08/23/2018 0828   CALCIUM 9.9 12/17/2019 1323   PROT 7.7 12/17/2019 1323   ALBUMIN 4.1 12/17/2019 1323   AST 15 12/17/2019 1323   AST 15 08/23/2018 0828   ALT 20 12/17/2019 1323   ALT 21 08/23/2018 0828   ALKPHOS 91 12/17/2019 1323   BILITOT 0.4 12/17/2019 1323   BILITOT 0.3 08/23/2018 0828   GFRNONAA >60 12/17/2019 1323   GFRNONAA >60 08/23/2018 0828   GFRAA >60 12/17/2019 1323   GFRAA >60 08/23/2018 0828    Lab Results  Component Value Date   TOTALPROTELP 7.5 05/08/2017    No results found for: KPAFRELGTCHN, LAMBDASER, KAPLAMBRATIO  Lab Results  Component Value Date   WBC 12.5 (H) 12/17/2019   NEUTROABS 8.7 (H) 12/17/2019   HGB 14.5 12/17/2019   HCT 43.4 12/17/2019   MCV 99.1 12/17/2019   PLT 306 12/17/2019   No results found for: LABCA2  No components found for: RWERXV400  No results for input(s): INR in the last 168 hours.  No results found for: LABCA2  No results found for: QQP619  No results found for: JKD326  No results found for: ZTI458  No results found for: CA2729  No components found for: HGQUANT  No results found for: CEA1 / No results found for: CEA1   No results found for: AFPTUMOR  No results found for: [place]  No results found for: HGBA, HGBA2QUANT, HGBFQUANT, HGBSQUAN (Hemoglobinopathy evaluation)   Lab Results  Component Value Date   LDH 171 05/08/2017    No  results found for: IRON, TIBC, IRONPCTSAT (Iron and TIBC)  No results found for: FERRITIN  Urinalysis    Component Value Date/Time   COLORURINE YELLOW 04/04/2014 2112   APPEARANCEUR CLEAR 04/04/2014 2112   LABSPEC 1.017 04/04/2014 2112   PHURINE 6.0 04/04/2014 2112   [place] 04/04/2014 2112   [place] NEGATIVE 04/04/2014 2112   [place] NEGATIVE 04/04/2014 2112   [place] NEGATIVE 04/04/2014 2112   PROTEINUR NEGATIVE 04/04/2014 2112   UROBILINOGEN 0.2 04/04/2014 2112   NITRITE NEGATIVE 04/04/2014 2112   LEUKOCYTESUR NEGATIVE 04/04/2014 2112    STUDIES:  ECHOCARDIOGRAM STRESS TEST  Result Date: 05/26/2020     EXERCISE STRESS ECHO REPORT    -------------------------------------------------------------------------------- Patient Name:   JAZZMAN LOUGHMILLER Date of Exam: 05/26/2020 Medical Rec #:  [id number]       Height:       68.0 in Accession #:    [id number]      Weight:       166.6 lb Date of Birth:  05-Jun-1945        BSA:          1.891 m Patient Age:    20 years        BP:           NA/NA mmHg Patient Gender: F               HR:           NA bpm. Exam Location:  [address] Procedure: Limited Echo Indications:    Chest pain  History:        Patient has prior history of Echocardiogram examinations.  Sonographer:    Coralyn Helling Referring Phys: [place]  1. Stress echo was not performed due to poor image quality. Limited rest images show normal LV systolic function. Harrell Gave  Gardiner Rhyme MD Electronically signed on 05/26/2020 at 5:47:08 PM    Final       ELIGIBLE FOR AVAILABLE RESEARCH PROTOCOL: no   ASSESSMENT: 75 y.o. [place] woman status post left breast lower inner quadrant biopsy 01/16/2018 for a clinical T1c N0, stage IA invasive ductal carcinoma, grade 3, estrogen receptor weakly positive, progesterone receptor negative, with an MIB-1 of 80%, and HER-2 amplified  (1) genetics 03/06/2018 showed no deleterious mutations  (2) status post left  lumpectomy and sentinel lymph node sampling 02/18/2018 for a pT2 pN0, stage IIA invasive ductal carcinoma, grade 3, with negative margins.  (a) a total of 5 lymph nodes were removed   (3) adjuvant chemo immunotherapy consisting of paclitaxel/trastuzumab weekly for 12 weeks, started 03/08/2018.  (a) paclitaxel discontinued after 6 doses because of peripheral neuropathy; last dose 04/12/2018  (4) trastuzumab continued to complete a year (last dose 02/25/2019)  (a) echocardiogram 02/06/2018 shows an ejection fraction in the 60-65% range  (b) echocardiogram 06/06/2018 shows an ejection fraction in the 60-65% range  (c) echocardiogram 09/09/2018 shows an ejection fraction in the 60-65% range  (d) echocardiogram 12/16/2018 shows an ejection fraction in the 60-65% range  (5) adjuvant radiation 05/30/2018 - 06/26/2018  (a) Left Breast / 42.56 Gy in 16 fractions  (b) Boost / 8 Gy in 4 fractions  (6) anastrozole started June 2020  (a) bone density at [facility] 12/27/2017 shows a T score of -1.7  (7) tobacco abuse disorder: Saranya stopped smoking 06/26/2018 but later resumed   PLAN: Anahlia is here today for follow up of her breast cancer, ER positive unable to tolerate Anastrozole.  She has no signs of breast cancer recurrence.  She is up to date with her mammogram and she will repeat mammogram in 12/2020.  She is very active and is up to date with her health maintenance.  I recommended her to continue this.  She will continue to f/u with her PCP regularly and with the HF clinic about her cardiac enlargement and evaluation.    She and I discussed the intermittent pain in her leg, and we will get some labs and a d dimer today to r/o blood clot. Her leg is not swollen.  Should it persist, I would recommend getting plain films.  She understands this and will let us know.    We will see her back in June for f/u.  She knows to call for any questions that may arise between now and her next  appointment.  We are happy to see her sooner if needed.   Total encounter time 20 minutes.Wilber Bihari, NP 06/04/20 11:05 AM Medical Oncology and Hematology [facility] [place],  [address] Tel. [phone]    Fax. [phone]   *Total Encounter Time as defined by the Centers for Medicare and Medicaid Services includes, in addition to the face-to-face time of a patient visit (documented in the note above) non-face-to-face time: obtaining and reviewing outside history, ordering and reviewing medications, tests or procedures, care coordination (communications with other health care professionals or caregivers) and documentation in the medical record.

## 2020-06-04 NOTE — Progress Notes (Signed)
D dimer elevated, will right leg doppler  Margaret Hodges

## 2020-06-04 NOTE — Progress Notes (Signed)
Lower extremity venous RT study completed.  Preliminary results relayed to Lindi Adie, MD.   See CV Proc for preliminary results report.   Darlin Coco, RDMS, RVT

## 2020-06-04 NOTE — Patient Instructions (Signed)

## 2020-06-08 ENCOUNTER — Telehealth: Payer: Self-pay | Admitting: Internal Medicine

## 2020-06-08 ENCOUNTER — Telehealth (HOSPITAL_COMMUNITY): Payer: Self-pay | Admitting: *Deleted

## 2020-06-08 ENCOUNTER — Other Ambulatory Visit (HOSPITAL_COMMUNITY): Payer: Self-pay | Admitting: *Deleted

## 2020-06-08 DIAGNOSIS — R0789 Other chest pain: Secondary | ICD-10-CM

## 2020-06-08 NOTE — Telephone Encounter (Signed)
Patients daughter left VM that pt had not been contacted to schedule lexiscan. I spoke with our scheduler and she messaged scheduling at [facility] to contact pt as soon as possible to schedule the appointment.

## 2020-06-08 NOTE — Telephone Encounter (Signed)
error 

## 2020-06-09 ENCOUNTER — Telehealth: Payer: Self-pay

## 2020-06-09 NOTE — Telephone Encounter (Signed)
Spoke with the patient, detailed instructions given. She stated that she would be here for her test. Asked to call back with any questions. S.Greer Koeppen EMTP 

## 2020-06-10 ENCOUNTER — Ambulatory Visit (HOSPITAL_COMMUNITY): Payer: Medicare Other | Attending: Internal Medicine

## 2020-06-10 ENCOUNTER — Other Ambulatory Visit: Payer: Self-pay

## 2020-06-10 DIAGNOSIS — R0789 Other chest pain: Secondary | ICD-10-CM | POA: Diagnosis not present

## 2020-06-10 LAB — MYOCARDIAL PERFUSION IMAGING
LV dias vol: 41 mL (ref 46–106)
LV sys vol: 10 mL
Peak HR: 118 {beats}/min
Rest HR: 76 {beats}/min
SDS: 0
SRS: 0
SSS: 0
TID: 1.33

## 2020-06-10 MED ORDER — TECHNETIUM TC 99M TETROFOSMIN IV KIT
32.3000 | PACK | Freq: Once | INTRAVENOUS | Status: AC | PRN
  Administered 2020-06-10: 32.3 via INTRAVENOUS
  Filled 2020-06-10: qty 33

## 2020-06-10 MED ORDER — REGADENOSON 0.4 MG/5ML IV SOLN
0.4000 mg | Freq: Once | INTRAVENOUS | Status: AC
  Administered 2020-06-10: 0.4 mg via INTRAVENOUS

## 2020-06-10 MED ORDER — TECHNETIUM TC 99M TETROFOSMIN IV KIT
9.9000 | PACK | Freq: Once | INTRAVENOUS | Status: AC | PRN
  Administered 2020-06-10: 9.9 via INTRAVENOUS
  Filled 2020-06-10: qty 10

## 2020-07-01 ENCOUNTER — Telehealth (HOSPITAL_COMMUNITY): Payer: Self-pay

## 2020-07-01 NOTE — Telephone Encounter (Signed)
Received a fax requesting medical records from [facility]. Records were successfully faxed to: [phone] ,which was the number provided.. Medical request form will be scanned into patients chart.

## 2020-07-29 ENCOUNTER — Ambulatory Visit: Payer: Medicare Other | Admitting: Oncology

## 2020-07-29 ENCOUNTER — Other Ambulatory Visit: Payer: Medicare Other

## 2020-08-18 IMAGING — MG DIGITAL DIAGNOSTIC BILAT W/ TOMO W/ CAD
8 series · 8 of 24 positions shown · non-contrast
Comparison: Previous exam(s).

CLINICAL DATA: History of left breast cancer status post lumpectomy
in Thursday January, 2018.

EXAM:
DIGITAL DIAGNOSTIC BILATERAL MAMMOGRAM WITH CAD AND TOMO

[R CC synth-2D]
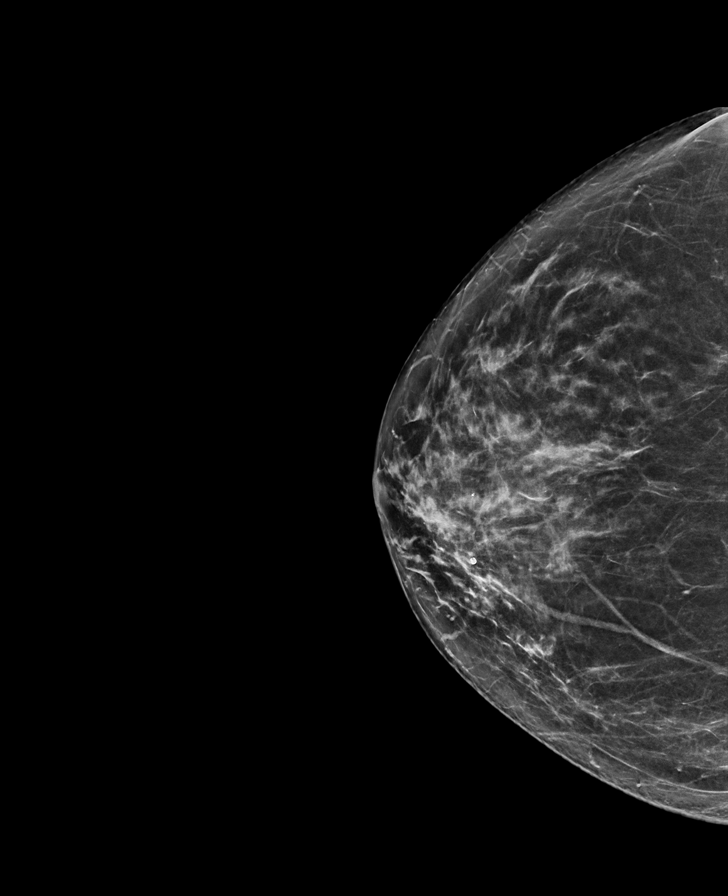

[L CC synth-2D]
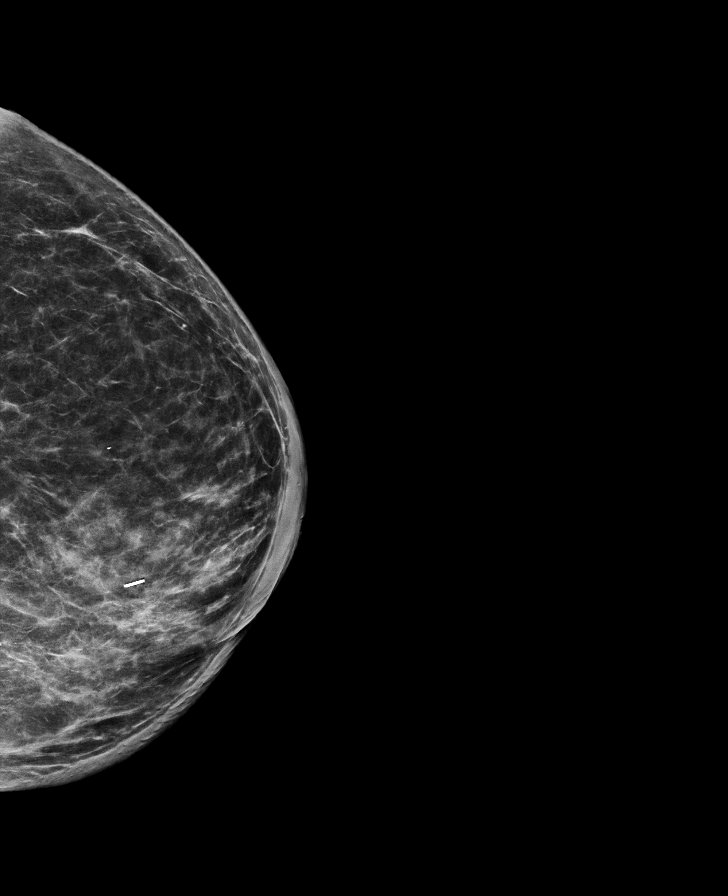

[R MLO synth-2D]
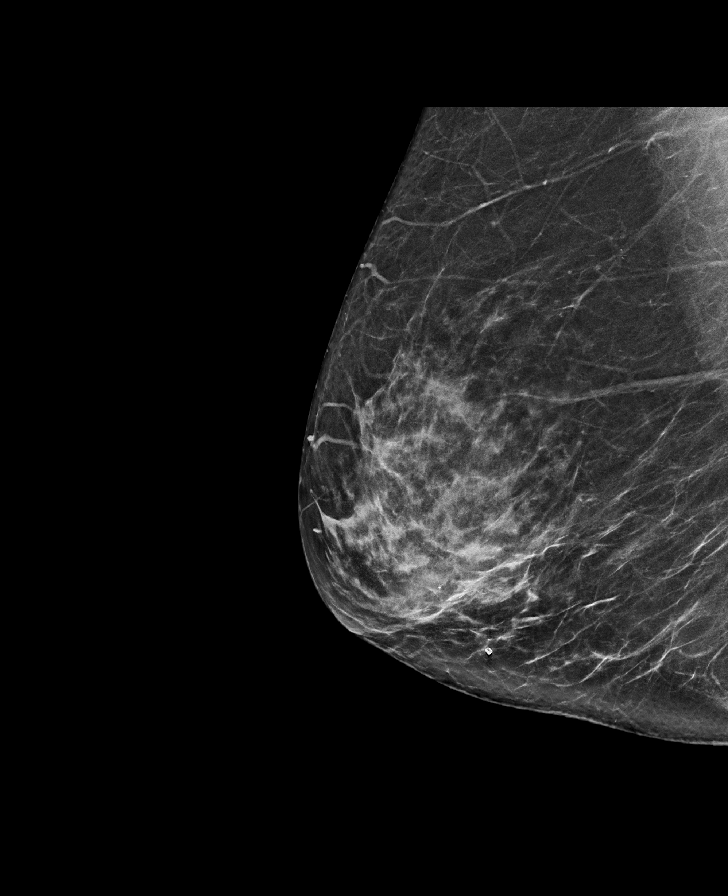

[L MLO synth-2D]
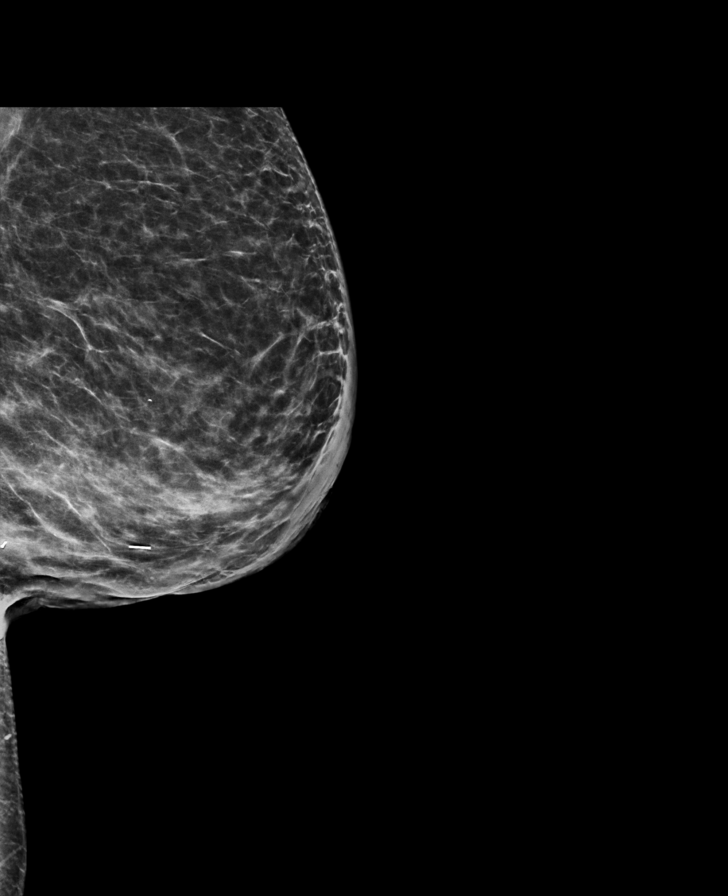

[L CC tomo · tomo slice 41/82.0]
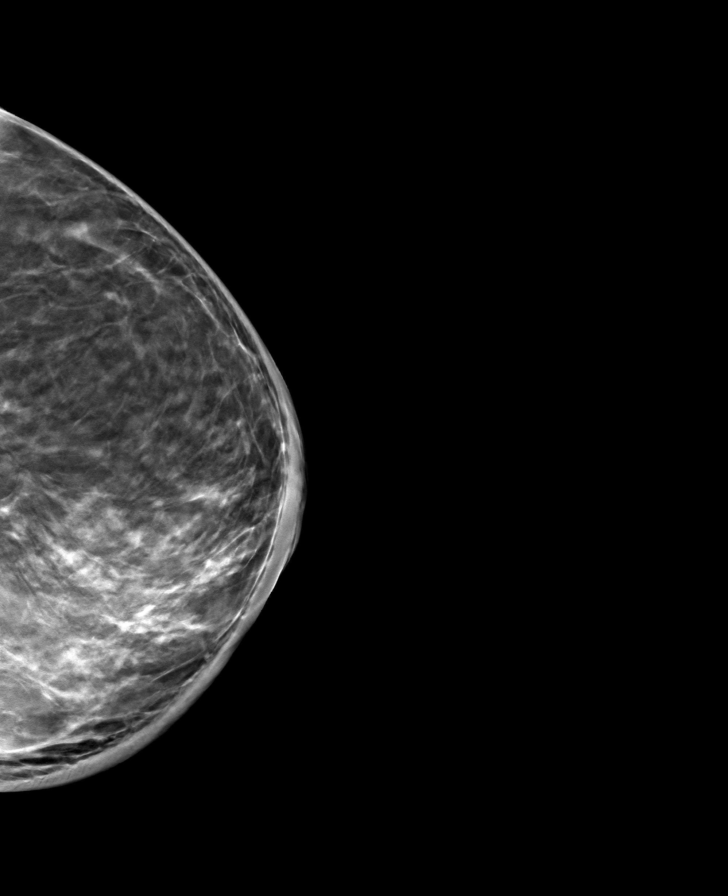

[R MLO tomo · tomo slice 37/72.0]
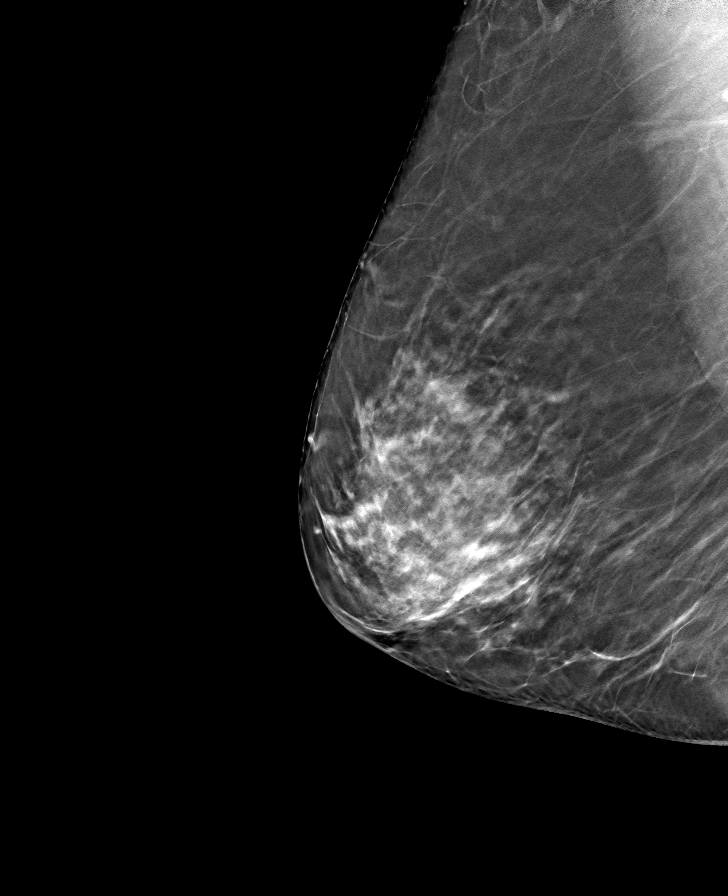

[R CC tomo · tomo slice 35/70.0]
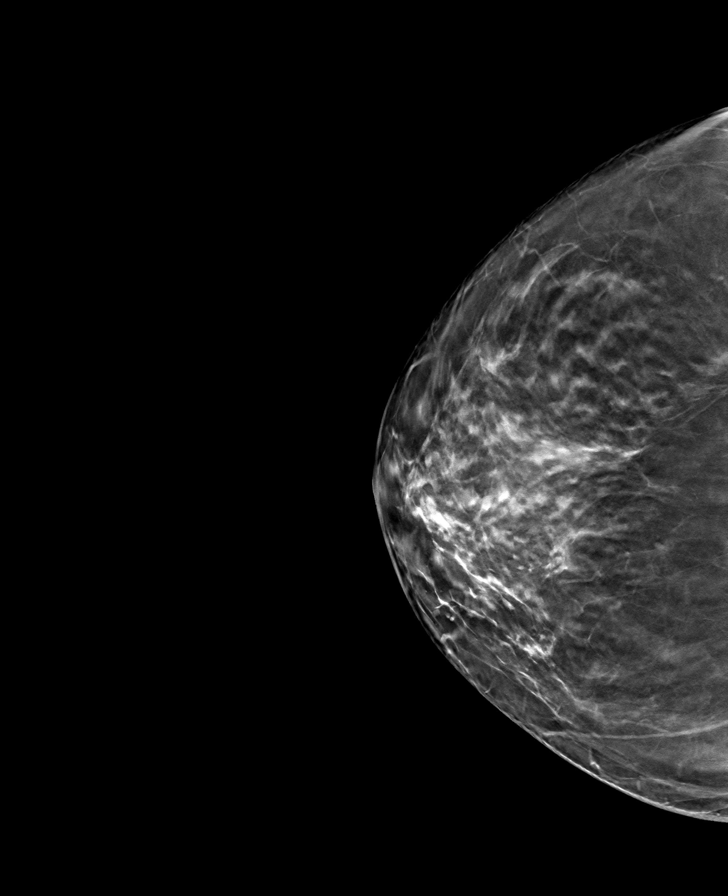

[L MLO tomo · tomo slice 42/83.0]
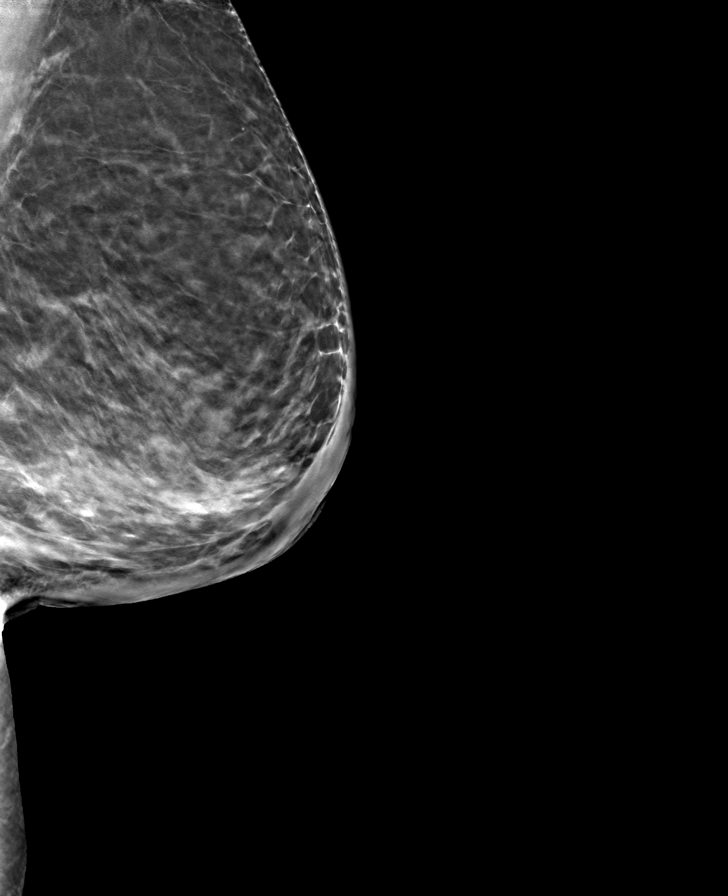

[8 of 24 positions shown; findings below may reference images not displayed]

ACR Breast Density Category c: The breast tissue is heterogeneously
dense, which may obscure small masses.
FINDINGS: Lumpectomy changes are seen in the left breast. No suspicious mass
or malignant type microcalcifications identified in either breast.

Mammographic images were processed with CAD.
IMPRESSION: No evidence of malignancy in either breast.

RECOMMENDATION:
Bilateral diagnostic mammogram in 1 year is recommended.

I have discussed the findings and recommendations with the patient.
If applicable, a reminder letter will be sent to the patient
regarding the next appointment.

BI-RADS CATEGORY  2: Benign.

## 2020-09-07 NOTE — Progress Notes (Signed)
[place]  [phone] [phone]     ID: ISLAY POLANCO DOB: 05/28/1945  MR#: [id number]  NAT#:[id number]  Patient Care Team: [name], MD as PCP - General (Family Medicine) Tanylah Schnoebelen, Virgie Dad, MD as Consulting Physician (Oncology) Kyung Rudd, MD as Consulting Physician (Radiation Oncology) Allyn Kenner, MD (Dermatology) Larey Dresser, MD as Consulting Physician (Cardiology) Bensimhon, Shaune Pascal, MD as Consulting Physician (Cardiology) OTHER MD:   CHIEF COMPLAINT: Estrogen receptor positive breast cancer  CURRENT TREATMENT: observation   INTERVAL HISTORY: Lorelai returns today for follow-up of her estrogen receptor positive breast cancer. She has opted to proceed with observation alone.  Her most recent mammogram was completed on 01/16/2020 and showed no evidence of malignancy and breast density category B.    At her last visit, she noted right leg pain. She was referred for right lower extremity doppler on 06/04/2020. This was negative for deep vein thrombosis.  Of note, she also underwent lung cancer screening chest CT on 04/23/2020 showing: Lung-RADS 2, benign appearance or behavior.   REVIEW OF SYSTEMS: Ameli has been having frequency and urgency.  No fever, no cramps.  She says this is the way it feels when she has a urinary tract infection.  She has significant reflux symptoms and this was worked up by her primary care with an EKG which showed possible right atrial enlargement.  She also had a lung CT screening for lung cancer and that was negative as noted above.  She had a stress test under cardiology and that was also benign.  The other good news is that she quit smoking 03/27/2020.  She recently had a dream where her sister, who is dead, was shopping with her and there shopping bags got exchanged and when she opened her sisters shopping back there were 5 packs of cigarettes in it.  A detailed review of systems today was otherwise  stable   COVID 19 VACCINATION STATUS: Status post Pfizer x2 but does not want the booster because she is afraid of Guillain-Barr syndrome   HISTORY OF CURRENT ILLNESS: From the original intake note:  ELLER SWEIS had routine screening mammography on 01/03/2018 showing a possible abnormality in the left breast. She underwent bilateral diagnostic mammography with tomography and left breast ultrasonography at [facility] on 01/15/2018 showing: Breast density category B; a 1.3 cm round solid mass with an indistinct margin in the left breast at 7 o'clock posterior 8 cm from the nipple. No significant abnormalities found in the left axilla.  Accordingly on 01/16/2018 she proceeded to biopsy of the left breast area in question. The pathology from this procedure showed [id number]): Invasive ductal carcinoma, grade III. Prognostic indicators significant for: estrogen receptor, 60% positive, with weak staining intensity; progesterone receptor negative, Proliferation marker Ki67 at 80%. HER2 positive by immunohistochemistry, 3+.  The patient's subsequent history is as detailed below.   PAST MEDICAL HISTORY: Past Medical History:  Diagnosis Date   Anginal pain ([place])    with admission to hospital due to high blood pressure   Complication of anesthesia    Family history of ovarian cancer    GERD (gastroesophageal reflux disease)    Guillain Barr syndrome ([place]) 1959   Guillain Barr syndrome ([place]) 1959   Guillain Barr syndrome ([place]) 1959   Headache    sinus headaches   History of hiatal hernia    was told pt. had one, but no testing   Hypertensive urgency 04/05/2014   Malignant neoplasm of lower-inner quadrant  of left breast in female, estrogen receptor positive ([place]) 01/24/2018   Neuromuscular disorder ([place])    Guillian- Barre Syndrome   Pneumonia    hx. of walking pneumonia   PONV (postoperative nausea and vomiting)     PAST SURGICAL HISTORY: Past Surgical History:  Procedure Laterality  Date   ABDOMINAL HYSTERECTOMY     Total   Bladder lift  15 years ago,  Uterus prolapse causing intestinal damage and surgery     BREAST LUMPECTOMY Left 02/18/2018   BREAST LUMPECTOMY WITH RADIOACTIVE SEED AND SENTINEL LYMPH NODE BIOPSY Left 02/18/2018   Procedure: LEFT BREAST LUMPECTOMY WITH RADIOACTIVE SEED AND LEFT AXILLARY DEEP SENTINEL LYMPH NODE BIOPSY, INJECT BLUE DYE LEFT BREAST;  Surgeon: Fanny Skates, MD;  Location: [facility];  Service: General;  Laterality: Left;   PORT-A-CATH REMOVAL N/A 03/17/2019   Procedure: REMOVAL PORT-A-CATH;  Surgeon: Fanny Skates, MD;  Location: [facility];  Service: General;  Laterality: N/A;   PORTACATH PLACEMENT Right 02/18/2018   Procedure: INSERTION PORT-A-CATH;  Surgeon: Fanny Skates, MD;  Location: [facility];  Service: General;  Laterality: Right;   Right torn meniscus repair x 2     THYROID LOBECTOMY N/A 02/04/2015   Procedure: TOTAL THYROIDECTOMY;  Surgeon: Armandina Gemma, MD;  Location: [facility];  Service: General;  Laterality: N/A;   TONSILLECTOMY     TUMOR REMOVAL     Tumors on arms removed,  Tumor on gum removed,  Tumor removed from inside ear      FAMILY HISTORY Family History  Problem Relation Age of Onset   Ovarian cancer Sister    Lung cancer Sister    Throat cancer Brother    Brain cancer Sister   She notes that her father died from old age at age 66. Patients' mother died from multiple comorbidities at age 63. The patient has 5 brothers and 6 sisters. Patients' sister had ovarian cancer at age 20 and lung cancer at 57, brother with throat cancer at age 63, and sister with brain cancer at age 6.    GYNECOLOGIC HISTORY:  No LMP recorded. Patient has had a hysterectomy. 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 Her husband was a Librarian, academic at [employer].  He was an Civil Service fast streamer who developed supranuclear palsy and died early in the morning of 01/05/20. Her son, Demara Lover, manages a call center for medical supplies. Her daughter, [name], is Patient Care surgical coordinator at [employer] clinic.  The patient has 2 grandchildren. She attends [employer].    ADVANCED DIRECTIVES: Her Ogilvie is her daughter, [name] and can be reached at [phone].     HEALTH MAINTENANCE: Social History   Tobacco Use   Smoking status: Former    Packs/day: 1.50    Pack years: 0.00    Types: Cigarettes   Smokeless tobacco: Never  Substance Use Topics   Alcohol use: No   Drug use: No     Colonoscopy: Yes through [facility]  PAP:   Bone density:    Allergies  Allergen Reactions   Allegra [Fexofenadine] Hives   Clindamycin/Lincomycin Hives and Diarrhea   Influenza A (H1n1) Monoval Vac Other (See Comments)    Guillian  Barre Syndrome   Keflex [Cephalexin] Hives   Penicillins Swelling    Has patient had a PCN reaction causing immediate rash, facial/tongue/throat swelling, SOB or lightheadedness with hypotension: No Has patient had a PCN reaction causing severe rash involving mucus membranes or skin necrosis: No Has patient had a PCN reaction that required hospitalization No Has patient had a PCN reaction occurring within the last 10 years: No If all of the above answers are "NO", then may proceed with Cephalosporin use.    Prednisone Other (See Comments)    "heart attack symptoms" but even had heart cath & had no cardiac disease   Statins Other (See Comments)    myalgias   Influenza Vaccines Other (See Comments)   Other     Other reaction(s): Leg pain Other reaction(s): hives    Current Outpatient Medications  Medication Sig Dispense Refill   ciprofloxacin (CIPRO) 500 MG  tablet Take 1 tablet (500 mg total) by mouth 2 (two) times daily. 6 tablet 0   amLODipine (NORVASC) 5 MG tablet Take 1 tablet (5 mg total) by mouth daily. 30 tablet 0   Cholecalciferol (VITAMIN D) 2000 UNITS tablet Take 2,000 Units by mouth daily.     levothyroxine (SYNTHROID, LEVOTHROID) 100 MCG tablet Take 100 mcg by mouth daily.     omeprazole (PRILOSEC) 40 MG capsule TAKE 1 CAPSULE EVERY DAY 90 capsule 3   Probiotic Product (PROBIOTIC-10 ULTIMATE PO) Take by mouth.     vitamin B-12 (CYANOCOBALAMIN) 1000 MCG tablet Take 1,000 mcg by mouth daily.     No current facility-administered medications for this visit.    OBJECTIVE: Vitals:   09/08/20 1455  BP: (!) 151/69  Pulse: 88  Resp: 18  Temp: 97.7 F (36.5 C)  SpO2: 96%     Body mass index is 26.64 kg/m.   Wt Readings from Last 3 Encounters:  09/08/20 175 lb 3.2 oz (79.5 kg)  06/04/20 168 lb (76.2 kg)  05/05/20 166 lb 9.6 oz (75.6 kg)  ECOG FS:1 - Symptomatic but completely ambulatory  Sclerae unicteric, EOMs intact Wearing a mask No cervical or supraclavicular adenopathy Lungs no rales or rhonchi Heart regular rate and rhythm Abd soft, nontender, positive bowel sounds MSK no focal spinal tenderness, no upper extremity lymphedema Neuro: nonfocal, well oriented, appropriate affect Breasts: The right breast is unremarkable.  The left breast is status post lumpectomy and radiation.  There is no evidence of local recurrence.  Both axillae are benign   LAB RESULTS:  CMP     Component Value Date/Time   NA 140 09/08/2020 1417   K 4.4 09/08/2020 1417   CL 107 09/08/2020 1417   CO2 22 09/08/2020 1417   GLUCOSE 113 (H) 09/08/2020 1417   BUN 17 09/08/2020 1417   CREATININE 0.80 09/08/2020 1417   CREATININE 0.81 06/04/2020 1139   CALCIUM 9.9 09/08/2020 1417   PROT 7.8 09/08/2020 1417   ALBUMIN 3.8 09/08/2020 1417   AST 14 (L) 09/08/2020 1417   AST 15 06/04/2020 1139   ALT 18 09/08/2020 1417   ALT 20 06/04/2020 1139    ALKPHOS 90 09/08/2020 1417   BILITOT 0.2 (L) 09/08/2020 1417   BILITOT 0.4 06/04/2020 1139   GFRNONAA >60 09/08/2020 1417   GFRNONAA >60 06/04/2020 1139   GFRAA >60 12/17/2019 1323   GFRAA >60 08/23/2018 0828    Lab Results  Component Value Date   TOTALPROTELP 7.5 05/08/2017    No results found for: KPAFRELGTCHN, LAMBDASER, KAPLAMBRATIO  Lab Results  Component Value Date   WBC 11.1 (H) 09/08/2020   NEUTROABS 7.2 09/08/2020   HGB 14.1 09/08/2020   HCT 42.1 09/08/2020   MCV 98.8 09/08/2020   PLT 311 09/08/2020   No results found for: LABCA2  No components found for: ONGEXB284  No results for input(s): INR in the last 168 hours.  No results found for: LABCA2  No results found for: XLK440  No results found for: NUU725  No results found for: DGU440  No results found for: CA2729  No components found for: HGQUANT  No results found for: CEA1 / No results found for: CEA1   No results found for: AFPTUMOR  No results found for: CHROMOGRNA  No results found for: HGBA, HGBA2QUANT, HGBFQUANT, HGBSQUAN (Hemoglobinopathy evaluation)   Lab Results  Component Value Date   LDH 171 05/08/2017    No results found for: IRON, TIBC, IRONPCTSAT (Iron and TIBC)  No results found for: FERRITIN  Urinalysis    Component Value Date/Time   COLORURINE YELLOW 04/04/2014 2112   APPEARANCEUR CLEAR 04/04/2014 2112   LABSPEC 1.017 04/04/2014 2112   PHURINE 6.0 04/04/2014 2112   [place] 04/04/2014 2112   [place] NEGATIVE 04/04/2014 2112   [place] NEGATIVE 04/04/2014 2112   [place] NEGATIVE 04/04/2014 2112   PROTEINUR NEGATIVE 04/04/2014 2112   UROBILINOGEN 0.2 04/04/2014 2112   NITRITE NEGATIVE 04/04/2014 2112   LEUKOCYTESUR NEGATIVE 04/04/2014 2112    STUDIES:  No results found.    ELIGIBLE FOR AVAILABLE RESEARCH PROTOCOL: no   ASSESSMENT: 75 y.o. [place] woman status post left breast lower inner quadrant biopsy 01/16/2018 for a clinical T1c N0, stage  IA invasive ductal carcinoma, grade 3, estrogen receptor weakly positive, progesterone receptor negative, with an MIB-1 of 80%, and HER-2 amplified  (1) genetics 03/06/2018 showed no deleterious mutations  (2) status post left lumpectomy and sentinel lymph node sampling 02/18/2018 for a pT2 pN0, stage IIA invasive ductal carcinoma, grade 3, with negative margins.  (a) a total of 5 lymph nodes were removed   (3) adjuvant chemo immunotherapy consisting of paclitaxel/trastuzumab weekly for 12 weeks, started 03/08/2018.  (a) paclitaxel discontinued after 6 doses because of peripheral neuropathy; last dose 04/12/2018  (4) trastuzumab continued to complete a year (last dose 02/25/2019)  (a) echocardiogram 02/06/2018 shows an ejection fraction in the 60-65% range  (b) echocardiogram 06/06/2018 shows an ejection fraction in the 60-65% range  (c) echocardiogram 09/09/2018 shows an ejection fraction in the 60-65% range  (d) echocardiogram 12/16/2018 shows an ejection fraction in the 60-65% range  (5) adjuvant radiation 05/30/2018 - 06/26/2018  (a) Left Breast / 42.56 Gy in 16 fractions  (b) Boost / 8 Gy in 4 fractions  (6) anastrozole started June 2020, discontinued by patient Sept 2021 due to concerns regarding side effects  (a) bone density at [facility] 12/27/2017 shows a T score of -1.7  (7) tobacco abuse disorder: Keshana stopped smoking 06/26/2018, later resumed, quit again 03/27/2020   PLAN: Felicia is here today for follow up of her breast cancer, weakly ER positive, treted briefly with anastrozole.  She is 2-1/2 years out from definitive surgery for her breast cancer with no evidence of disease recurrence.  She has convincing symptoms of urinary tract infection and while I am getting a culture today I am also starting her on Cipro to take twice daily for 3 days.  She will let me know if the symptoms do not resolve with that treatment.  I encouraged her to continue off cigarettes  and  to exercise more.  She no longer sees a Psychologist, sport and exercise for her breast cancer since her surgeon retired.  She will have mammography in November and see her primary care at that time.  She will return to see [place] a year from now so she will see her primary care in November and see [place] in June every year until she completes her 5 years of follow-up  Total encounter time 25 minutes.Sarajane Jews C. Reginna Sermeno, MD 09/08/20 3:36 PM Medical Oncology and Hematology [facility] [place], [place] [address] Tel. [phone]    Fax. [phone]   I, Wilburn Mylar, am acting as scribe for Dr. Virgie Dad. Malikai Gut.  I, Lurline Del MD, have reviewed the above documentation for accuracy and completeness, and I agree with the above.    *Total Encounter Time as defined by the Centers for Medicare and Medicaid Services includes, in addition to the face-to-face time of a patient visit (documented in the note above) non-face-to-face time: obtaining and reviewing outside history, ordering and reviewing medications, tests or procedures, care coordination (communications with other health care professionals or caregivers) and documentation in the medical record.

## 2020-09-08 ENCOUNTER — Other Ambulatory Visit: Payer: Self-pay | Admitting: *Deleted

## 2020-09-08 ENCOUNTER — Inpatient Hospital Stay: Payer: Medicare Other | Attending: Oncology

## 2020-09-08 ENCOUNTER — Inpatient Hospital Stay (HOSPITAL_BASED_OUTPATIENT_CLINIC_OR_DEPARTMENT_OTHER): Payer: Medicare Other | Admitting: Oncology

## 2020-09-08 ENCOUNTER — Other Ambulatory Visit: Payer: Self-pay

## 2020-09-08 VITALS — BP 151/69 | HR 88 | Temp 97.7°F | Resp 18 | Ht 68.0 in | Wt 175.2 lb

## 2020-09-08 DIAGNOSIS — F17210 Nicotine dependence, cigarettes, uncomplicated: Secondary | ICD-10-CM | POA: Diagnosis not present

## 2020-09-08 DIAGNOSIS — R309 Painful micturition, unspecified: Secondary | ICD-10-CM

## 2020-09-08 DIAGNOSIS — Z170 Estrogen receptor positive status [ER+]: Secondary | ICD-10-CM | POA: Diagnosis not present

## 2020-09-08 DIAGNOSIS — A499 Bacterial infection, unspecified: Secondary | ICD-10-CM | POA: Diagnosis not present

## 2020-09-08 DIAGNOSIS — C50312 Malignant neoplasm of lower-inner quadrant of left female breast: Secondary | ICD-10-CM | POA: Insufficient documentation

## 2020-09-08 DIAGNOSIS — Z79811 Long term (current) use of aromatase inhibitors: Secondary | ICD-10-CM | POA: Insufficient documentation

## 2020-09-08 DIAGNOSIS — N390 Urinary tract infection, site not specified: Secondary | ICD-10-CM | POA: Diagnosis not present

## 2020-09-08 DIAGNOSIS — Z87891 Personal history of nicotine dependence: Secondary | ICD-10-CM | POA: Insufficient documentation

## 2020-09-08 DIAGNOSIS — Z79899 Other long term (current) drug therapy: Secondary | ICD-10-CM | POA: Insufficient documentation

## 2020-09-08 DIAGNOSIS — Z720 Tobacco use: Secondary | ICD-10-CM

## 2020-09-08 LAB — URINALYSIS, COMPLETE (UACMP) WITH MICROSCOPIC
Bacteria, UA: NONE SEEN
Bilirubin Urine: NEGATIVE
Glucose, UA: NEGATIVE mg/dL
Hgb urine dipstick: NEGATIVE
Ketones, ur: NEGATIVE mg/dL
Leukocytes,Ua: NEGATIVE
Nitrite: NEGATIVE
Protein, ur: NEGATIVE mg/dL
Specific Gravity, Urine: 1.024 (ref 1.005–1.030)
pH: 5 (ref 5.0–8.0)

## 2020-09-08 LAB — CBC WITH DIFFERENTIAL/PLATELET
Abs Immature Granulocytes: 0.03 10*3/uL (ref 0.00–0.07)
Basophils Absolute: 0.1 10*3/uL (ref 0.0–0.1)
Basophils Relative: 1 %
Eosinophils Absolute: 0.3 10*3/uL (ref 0.0–0.5)
Eosinophils Relative: 3 %
HCT: 42.1 % (ref 36.0–46.0)
Hemoglobin: 14.1 g/dL (ref 12.0–15.0)
Immature Granulocytes: 0 %
Lymphocytes Relative: 23 %
Lymphs Abs: 2.5 10*3/uL (ref 0.7–4.0)
MCH: 33.1 pg (ref 26.0–34.0)
MCHC: 33.5 g/dL (ref 30.0–36.0)
MCV: 98.8 fL (ref 80.0–100.0)
Monocytes Absolute: 1 10*3/uL (ref 0.1–1.0)
Monocytes Relative: 9 %
Neutro Abs: 7.2 10*3/uL (ref 1.7–7.7)
Neutrophils Relative %: 64 %
Platelets: 311 10*3/uL (ref 150–400)
RBC: 4.26 MIL/uL (ref 3.87–5.11)
RDW: 12.9 % (ref 11.5–15.5)
WBC: 11.1 10*3/uL — ABNORMAL HIGH (ref 4.0–10.5)
nRBC: 0 % (ref 0.0–0.2)

## 2020-09-08 LAB — COMPREHENSIVE METABOLIC PANEL
ALT: 18 U/L (ref 0–44)
AST: 14 U/L — ABNORMAL LOW (ref 15–41)
Albumin: 3.8 g/dL (ref 3.5–5.0)
Alkaline Phosphatase: 90 U/L (ref 38–126)
Anion gap: 11 (ref 5–15)
BUN: 17 mg/dL (ref 8–23)
CO2: 22 mmol/L (ref 22–32)
Calcium: 9.9 mg/dL (ref 8.9–10.3)
Chloride: 107 mmol/L (ref 98–111)
Creatinine, Ser: 0.8 mg/dL (ref 0.44–1.00)
GFR, Estimated: >60 mL/min (ref >60)
Glucose, Bld: 113 mg/dL — ABNORMAL HIGH (ref 70–99)
Potassium: 4.4 mmol/L (ref 3.5–5.1)
Sodium: 140 mmol/L (ref 135–145)
Total Bilirubin: 0.2 mg/dL — ABNORMAL LOW (ref 0.3–1.2)
Total Protein: 7.8 g/dL (ref 6.5–8.1)

## 2020-09-08 MED ORDER — CIPROFLOXACIN HCL 500 MG PO TABS: 500.0000 mg | ORAL_TABLET | Freq: Two times a day (BID) | ORAL | 0 refills | Status: DC

## 2020-09-09 ENCOUNTER — Telehealth: Payer: Self-pay | Admitting: *Deleted

## 2020-09-09 NOTE — Telephone Encounter (Signed)
This RN spoke with pt per her call stating she was prescribed antibiotic - Cipro - yesterday. She picked up the medication and read all the side effects and " I am scared to take it"  " It says it can cause stomach irritation and muscle issues - and I already have these issues - I don't want them to get worse " " I had Guillain-Barr so I am scared of getting it again "  She is asking if she can take a Z pak or doxycycline instead ( has tolerated them well in the past ).  Per review- noted prescription given for possible UTI - U/A showed no bacteria or WBCs in urine - culture pending.  This RN reviewed above and informed pt of results- with further inquiry about the UTI symptoms she informed MD about " oh I have chronic cystitis that is not new symptoms but my white count is elevated so where is that infection coming from "  This RN reviewed mild elevation of WBC of 11.1 - with normal differential.  Discussed with pt mild elevation may be related to smoking history with pt stating " I haven't smoked for 6 months."  Above reviewed with MD- who verified pt does not need antibiotic therapy at this time.  Pt informed of above.

## 2020-09-10 LAB — URINE CULTURE: Culture: <10000 — AB

## 2020-09-14 ENCOUNTER — Telehealth: Payer: Self-pay | Admitting: Hematology and Oncology

## 2020-09-14 NOTE — Telephone Encounter (Signed)
Sch per 6/17 sch msg, pt aware

## 2020-10-08 DIAGNOSIS — U071 COVID-19: Secondary | ICD-10-CM | POA: Diagnosis not present

## 2020-10-08 DIAGNOSIS — J449 Chronic obstructive pulmonary disease, unspecified: Secondary | ICD-10-CM | POA: Diagnosis not present

## 2020-10-20 DIAGNOSIS — J0100 Acute maxillary sinusitis, unspecified: Secondary | ICD-10-CM | POA: Diagnosis not present

## 2020-10-20 DIAGNOSIS — R059 Cough, unspecified: Secondary | ICD-10-CM | POA: Diagnosis not present

## 2020-10-20 DIAGNOSIS — U071 COVID-19: Secondary | ICD-10-CM | POA: Diagnosis not present

## 2020-11-15 ENCOUNTER — Ambulatory Visit (INDEPENDENT_AMBULATORY_CARE_PROVIDER_SITE_OTHER): Payer: Medicare Other | Admitting: Otolaryngology

## 2020-11-15 ENCOUNTER — Other Ambulatory Visit: Payer: Self-pay

## 2020-11-15 DIAGNOSIS — H6123 Impacted cerumen, bilateral: Secondary | ICD-10-CM

## 2020-11-15 DIAGNOSIS — J310 Chronic rhinitis: Secondary | ICD-10-CM

## 2020-11-15 DIAGNOSIS — H9072 Mixed conductive and sensorineural hearing loss, unilateral, left ear, with unrestricted hearing on the contralateral side: Secondary | ICD-10-CM

## 2020-11-15 NOTE — Progress Notes (Signed)
HPI: Margaret Hodges is a 75 y.o. female who presents for evaluation of sinuses and to have her ears cleaned.  She used to see Dr. Ernesto Rutherford to have her ears cleaned but this was over 2 years ago before he retired.  She also complains of nasal sinus congestion but no mucopurulent discharge from the nose. She has had previous left ear surgery for cholesteatoma and has minimal hearing in the left ear.  She feels like she hears okay in the right ear.  Past Medical History:  Diagnosis Date   Anginal pain ([place])    with admission to hospital due to high blood pressure   Complication of anesthesia    Family history of ovarian cancer    GERD (gastroesophageal reflux disease)    Guillain Barr syndrome ([place]) 1959   Guillain Barr syndrome ([place]) 1959   Guillain Barr syndrome ([place]) 1959   Headache    sinus headaches   History of hiatal hernia    was told pt. had one, but no testing   Hypertensive urgency 04/05/2014   Malignant neoplasm of lower-inner quadrant of left breast in female, estrogen receptor positive ([place]) 01/24/2018   Neuromuscular disorder ([place])    Guillian- Barre Syndrome   Pneumonia    hx. of walking pneumonia   PONV (postoperative nausea and vomiting)    Past Surgical History:  Procedure Laterality Date   ABDOMINAL HYSTERECTOMY     Total   Bladder lift  15 years ago,  Uterus prolapse causing intestinal damage and surgery     BREAST LUMPECTOMY Left 02/18/2018   BREAST LUMPECTOMY WITH RADIOACTIVE SEED AND SENTINEL LYMPH NODE BIOPSY Left 02/18/2018   Procedure: LEFT BREAST LUMPECTOMY WITH RADIOACTIVE SEED AND LEFT AXILLARY DEEP SENTINEL LYMPH NODE BIOPSY, INJECT BLUE DYE LEFT BREAST;  Surgeon: Fanny Skates, MD;  Location: [facility];  Service: General;  Laterality: Left;   PORT-A-CATH REMOVAL N/A 03/17/2019   Procedure: REMOVAL PORT-A-CATH;  Surgeon: Fanny Skates, MD;  Location: [facility];  Service: General;  Laterality: N/A;   PORTACATH  PLACEMENT Right 02/18/2018   Procedure: INSERTION PORT-A-CATH;  Surgeon: Fanny Skates, MD;  Location: [facility];  Service: General;  Laterality: Right;   Right torn meniscus repair x 2     THYROID LOBECTOMY N/A 02/04/2015   Procedure: TOTAL THYROIDECTOMY;  Surgeon: Armandina Gemma, MD;  Location: [facility];  Service: General;  Laterality: N/A;   TONSILLECTOMY     TUMOR REMOVAL     Tumors on arms removed,  Tumor on gum removed,  Tumor removed from inside ear     Social History   Socioeconomic History   Marital status: Married    Spouse name: Not on file   Number of children: Not on file   Years of education: Not on file   Highest education level: Not on file  Occupational History   Not on file  Tobacco Use   Smoking status: Former    Packs/day: 1.50    Types: Cigarettes   Smokeless tobacco: Never  Substance and Sexual Activity   Alcohol use: No   Drug use: No   Sexual activity: Not Currently  Other Topics Concern   Not on file  Social History Narrative   Not on file   Social Determinants of Health   Financial Resource Strain: Not on file  Food Insecurity: Not on file  Transportation Needs: Not on file  Physical Activity: Not on file  Stress: Not on file  Social  Connections: Not on file   Family History  Problem Relation Age of Onset   Ovarian cancer Sister    Lung cancer Sister    Throat cancer Brother    Brain cancer Sister    Allergies  Allergen Reactions   Allegra [Fexofenadine] Hives   Clindamycin/Lincomycin Hives and Diarrhea   Influenza A (H1n1) Monoval Vac Other (See Comments)    Guillian Barre Syndrome   Keflex [Cephalexin] Hives   Penicillins Swelling    Has patient had a PCN reaction causing immediate rash, facial/tongue/throat swelling, SOB or lightheadedness with hypotension: No Has patient had a PCN reaction causing severe rash involving mucus membranes or skin necrosis: No Has patient had a PCN reaction that required hospitalization  No Has patient had a PCN reaction occurring within the last 10 years: No If all of the above answers are "NO", then may proceed with Cephalosporin use.    Prednisone Other (See Comments)    "heart attack symptoms" but even had heart cath & had no cardiac disease   Statins Other (See Comments)    myalgias   Influenza Vaccines Other (See Comments)   Other     Other reaction(s): Leg pain Other reaction(s): hives   Prior to Admission medications   Medication Sig Start Date End Date Taking? Authorizing Provider  amLODipine (NORVASC) 5 MG tablet Take 1 tablet (5 mg total) by mouth daily. 04/06/14   Geradine Girt, DO  Cholecalciferol (VITAMIN D) 2000 UNITS tablet Take 2,000 Units by mouth daily.    [provider]  levothyroxine (SYNTHROID, LEVOTHROID) 100 MCG tablet Take 100 mcg by mouth daily. 05/08/18   [provider]  omeprazole (PRILOSEC) 40 MG capsule TAKE 1 CAPSULE EVERY DAY 01/15/20   Causey, Charlestine Massed, NP  Probiotic Product (PROBIOTIC-10 ULTIMATE PO) Take by mouth.    [provider]  vitamin B-12 (CYANOCOBALAMIN) 1000 MCG tablet Take 1,000 mcg by mouth daily.    [provider]     Positive ROS: Otherwise negative  All other systems have been reviewed and were otherwise negative with the exception of those mentioned in the HPI and as above.  Physical Exam: Constitutional: Alert, well-appearing, no acute distress Ears: External ears without lesions or tenderness.  She has small ear canals bilaterally.  Wax was removed from the right ear with a curette.  On the left side which she has had previous surgery forceps and curette were used to remove a large amount of wax from the left ear canal.  After cleaning the ear canals the right TM was clear and normal to examination.  She has had previous attic type surgery on the left side but no evidence of recurrent cholesteatoma.  On tuning fork testing Weber lateralized to the left side with BC > AC  on the left side.  AC was greater than BC on the right side but she did have mild hearing loss with the 1024 tuning fork noted on the right side. Nasal: External nose without lesions. Septum with minimal deformity and mild rhinitis.  Both middle meatus regions were clear with no evidence of mucopurulent discharge.  No polyps noted.. Clear nasal passages otherwise.  Of note she has had previous septal surgery with Dr. Ernesto Rutherford. Oral: Lips and gums without lesions. Tongue and palate mucosa without lesions. Posterior oropharynx clear. Neck: No palpable adenopathy or masses Respiratory: Breathing comfortably  Skin: No facial/neck lesions or rash noted.  Cerumen impaction removal  Date/Time: 11/15/2020 3:44 PM Performed by: Melony Overly  E, MD Authorized by: Rozetta Nunnery, MD   Consent:    Consent obtained:  Verbal   Consent given by:  Patient   Risks discussed:  Pain and bleeding Procedure details:    Location:  L ear and R ear   Procedure type: curette and forceps   Post-procedure details:    Inspection:  TM intact and canal normal   Hearing quality:  Improved   Procedure completion:  Tolerated well, no immediate complications Comments:     Patient with small ear canals bilaterally.  Moderate wax buildup on the right side that was cleaned with a curette and forceps.  The left ear canal had a large amount of wax that was removed with forceps.  She had previous surgery on the left TM and no evidence of recurrent cholesteatoma.  Assessment: Chronic rhinitis with no clinical evidence of active infection. Mod amount of wax buildup in both ear canals much worse on the left side where she has had previous surgery for cholesteatoma.  She has a significant conductive hearing loss on the left side and mild hearing loss in the right ear.  Plan: Ear canals were cleaned in the office today. Presently no evidence of active infection requiring antibiotic therapy. She uses Flonase  generally in the morning and recommended switching her Flonase to nighttime dose as this may provide better congestion relief.  Also suggested use of saline rinses and gave her samples of Xlear spray and saline rinse and suggested using the AYR brand or Xlear saline rinse brand. She will follow-up as needed. Briefly discussed with her concerning obtaining audiogram to better evaluate her hearing and that she would be a candidate for hearing aids if she is having difficulty with her hearing although she really does not complain of that much trouble hearing at this point and is not interested in hearing aids at this point.  Radene Journey, MD

## 2021-01-14 ENCOUNTER — Other Ambulatory Visit: Payer: Self-pay | Admitting: Adult Health

## 2021-02-03 ENCOUNTER — Encounter: Payer: Self-pay | Admitting: Oncology

## 2021-02-03 DIAGNOSIS — R922 Inconclusive mammogram: Secondary | ICD-10-CM | POA: Diagnosis not present

## 2021-02-03 DIAGNOSIS — Z853 Personal history of malignant neoplasm of breast: Secondary | ICD-10-CM | POA: Diagnosis not present

## 2021-04-13 DIAGNOSIS — R300 Dysuria: Secondary | ICD-10-CM | POA: Diagnosis not present

## 2021-05-23 DIAGNOSIS — E559 Vitamin D deficiency, unspecified: Secondary | ICD-10-CM | POA: Diagnosis not present

## 2021-05-23 DIAGNOSIS — E039 Hypothyroidism, unspecified: Secondary | ICD-10-CM | POA: Diagnosis not present

## 2021-05-23 DIAGNOSIS — F330 Major depressive disorder, recurrent, mild: Secondary | ICD-10-CM | POA: Diagnosis not present

## 2021-05-23 DIAGNOSIS — J439 Emphysema, unspecified: Secondary | ICD-10-CM | POA: Diagnosis not present

## 2021-05-23 DIAGNOSIS — I517 Cardiomegaly: Secondary | ICD-10-CM | POA: Diagnosis not present

## 2021-05-23 DIAGNOSIS — G620 Drug-induced polyneuropathy: Secondary | ICD-10-CM | POA: Diagnosis not present

## 2021-05-23 DIAGNOSIS — R7301 Impaired fasting glucose: Secondary | ICD-10-CM | POA: Diagnosis not present

## 2021-05-23 DIAGNOSIS — Z0000 Encounter for general adult medical examination without abnormal findings: Secondary | ICD-10-CM | POA: Diagnosis not present

## 2021-05-23 DIAGNOSIS — C50912 Malignant neoplasm of unspecified site of left female breast: Secondary | ICD-10-CM | POA: Diagnosis not present

## 2021-05-23 DIAGNOSIS — I129 Hypertensive chronic kidney disease with stage 1 through stage 4 chronic kidney disease, or unspecified chronic kidney disease: Secondary | ICD-10-CM | POA: Diagnosis not present

## 2021-05-23 DIAGNOSIS — I700 Atherosclerosis of aorta: Secondary | ICD-10-CM | POA: Diagnosis not present

## 2021-05-23 DIAGNOSIS — E785 Hyperlipidemia, unspecified: Secondary | ICD-10-CM | POA: Diagnosis not present

## 2021-05-23 DIAGNOSIS — G610 Guillain-Barre syndrome: Secondary | ICD-10-CM | POA: Diagnosis not present

## 2021-05-23 DIAGNOSIS — Z79899 Other long term (current) drug therapy: Secondary | ICD-10-CM | POA: Diagnosis not present

## 2021-06-02 ENCOUNTER — Telehealth: Payer: Self-pay | Admitting: Hematology and Oncology

## 2021-06-02 NOTE — Telephone Encounter (Signed)
Rescheduled appointment per providers template. Left message.  ? ?

## 2021-06-08 ENCOUNTER — Other Ambulatory Visit (HOSPITAL_COMMUNITY): Payer: Self-pay | Admitting: Internal Medicine

## 2021-06-14 DIAGNOSIS — Z20828 Contact with and (suspected) exposure to other viral communicable diseases: Secondary | ICD-10-CM | POA: Diagnosis not present

## 2021-07-13 DIAGNOSIS — Z20822 Contact with and (suspected) exposure to covid-19: Secondary | ICD-10-CM | POA: Diagnosis not present

## 2021-07-25 DIAGNOSIS — Z20822 Contact with and (suspected) exposure to covid-19: Secondary | ICD-10-CM | POA: Diagnosis not present

## 2021-09-07 ENCOUNTER — Ambulatory Visit: Payer: Medicare Other | Admitting: Hematology and Oncology

## 2021-09-07 ENCOUNTER — Other Ambulatory Visit: Payer: Medicare Other

## 2021-09-13 ENCOUNTER — Other Ambulatory Visit: Payer: Self-pay | Admitting: *Deleted

## 2021-09-13 DIAGNOSIS — Z170 Estrogen receptor positive status [ER+]: Secondary | ICD-10-CM

## 2021-09-14 ENCOUNTER — Encounter: Payer: Self-pay | Admitting: Hematology and Oncology

## 2021-09-14 ENCOUNTER — Inpatient Hospital Stay: Payer: Medicare Other | Attending: Hematology and Oncology

## 2021-09-14 ENCOUNTER — Other Ambulatory Visit: Payer: Self-pay

## 2021-09-14 ENCOUNTER — Inpatient Hospital Stay (HOSPITAL_BASED_OUTPATIENT_CLINIC_OR_DEPARTMENT_OTHER): Payer: Medicare Other | Admitting: Hematology and Oncology

## 2021-09-14 VITALS — BP 151/76 | HR 89 | Temp 97.7°F | Resp 16 | Wt 176.3 lb

## 2021-09-14 DIAGNOSIS — N644 Mastodynia: Secondary | ICD-10-CM | POA: Insufficient documentation

## 2021-09-14 DIAGNOSIS — Z87891 Personal history of nicotine dependence: Secondary | ICD-10-CM | POA: Diagnosis not present

## 2021-09-14 DIAGNOSIS — G629 Polyneuropathy, unspecified: Secondary | ICD-10-CM | POA: Diagnosis not present

## 2021-09-14 DIAGNOSIS — Z853 Personal history of malignant neoplasm of breast: Secondary | ICD-10-CM | POA: Diagnosis not present

## 2021-09-14 DIAGNOSIS — Z808 Family history of malignant neoplasm of other organs or systems: Secondary | ICD-10-CM | POA: Diagnosis not present

## 2021-09-14 DIAGNOSIS — Z170 Estrogen receptor positive status [ER+]: Secondary | ICD-10-CM

## 2021-09-14 DIAGNOSIS — C50312 Malignant neoplasm of lower-inner quadrant of left female breast: Secondary | ICD-10-CM

## 2021-09-14 DIAGNOSIS — Z8041 Family history of malignant neoplasm of ovary: Secondary | ICD-10-CM | POA: Insufficient documentation

## 2021-09-14 DIAGNOSIS — Z801 Family history of malignant neoplasm of trachea, bronchus and lung: Secondary | ICD-10-CM | POA: Diagnosis not present

## 2021-09-14 DIAGNOSIS — Z90710 Acquired absence of both cervix and uterus: Secondary | ICD-10-CM | POA: Diagnosis not present

## 2021-09-14 LAB — CBC WITH DIFFERENTIAL (CANCER CENTER ONLY)
Abs Immature Granulocytes: 0.02 10*3/uL (ref 0.00–0.07)
Basophils Absolute: 0.1 10*3/uL (ref 0.0–0.1)
Basophils Relative: 1 %
Eosinophils Absolute: 0.3 10*3/uL (ref 0.0–0.5)
Eosinophils Relative: 3 %
HCT: 41.7 % (ref 36.0–46.0)
Hemoglobin: 14.2 g/dL (ref 12.0–15.0)
Immature Granulocytes: 0 %
Lymphocytes Relative: 25 %
Lymphs Abs: 2.5 10*3/uL (ref 0.7–4.0)
MCH: 33 pg (ref 26.0–34.0)
MCHC: 34.1 g/dL (ref 30.0–36.0)
MCV: 97 fL (ref 80.0–100.0)
Monocytes Absolute: 0.9 10*3/uL (ref 0.1–1.0)
Monocytes Relative: 9 %
Neutro Abs: 6.3 10*3/uL (ref 1.7–7.7)
Neutrophils Relative %: 62 %
Platelet Count: 286 10*3/uL (ref 150–400)
RBC: 4.3 MIL/uL (ref 3.87–5.11)
RDW: 13.7 % (ref 11.5–15.5)
WBC Count: 10.1 10*3/uL (ref 4.0–10.5)
nRBC: 0 % (ref 0.0–0.2)

## 2021-09-14 LAB — CMP (CANCER CENTER ONLY)
ALT: 43 U/L (ref 0–44)
AST: 29 U/L (ref 15–41)
Albumin: 4.1 g/dL (ref 3.5–5.0)
Alkaline Phosphatase: 78 U/L (ref 38–126)
Anion gap: 7 (ref 5–15)
BUN: 19 mg/dL (ref 8–23)
CO2: 24 mmol/L (ref 22–32)
Calcium: 9.9 mg/dL (ref 8.9–10.3)
Chloride: 110 mmol/L (ref 98–111)
Creatinine: 0.82 mg/dL (ref 0.44–1.00)
GFR, Estimated: >60 mL/min (ref >60)
Glucose, Bld: 92 mg/dL (ref 70–99)
Potassium: 4.1 mmol/L (ref 3.5–5.1)
Sodium: 141 mmol/L (ref 135–145)
Total Bilirubin: 0.6 mg/dL (ref 0.3–1.2)
Total Protein: 7.7 g/dL (ref 6.5–8.1)

## 2021-09-14 NOTE — Progress Notes (Signed)
[facility]  [phone] [phone]     ID: Margaret Hodges DOB: [birth year]  MR#: [id number]  IOM#:[id number]  Patient Care Team: [name], MD as PCP - General (Family Medicine) Magrinat, Virgie Dad, MD (Inactive) as Consulting Physician (Oncology) Kyung Rudd, MD as Consulting Physician (Radiation Oncology) Allyn Kenner, MD (Dermatology) Larey Dresser, MD as Consulting Physician (Cardiology) Bensimhon, Shaune Pascal, MD as Consulting Physician (Cardiology) OTHER MD:   CHIEF COMPLAINT: Estrogen receptor positive breast cancer  CURRENT TREATMENT: observation   INTERVAL HISTORY: Margaret Hodges returns today for follow-up of her estrogen receptor positive breast cancer. She has opted to proceed with observation alone. She is doing quite well since her last visit.  She denies any breast changes except for intermittent breast pain which is relieved by wearing her compression bra.   Last mammogram in November which did not show any evidence of malignancy. No change in breathing, bowel habits or urinary habits. Rest of the pertinent 10 point ROS reviewed and negative.  REVIEW OF SYSTEMS: Margaret Hodges has been having frequency and urgency.  No fever, no cramps.  She says this is the way it feels when she has a urinary tract infection.  She has significant reflux symptoms and this was worked up by her primary care with an EKG which showed possible right atrial enlargement.  She also had a lung CT screening for lung cancer and that was negative as noted above.  She had a stress test under cardiology and that was also benign.  The other good news is that she quit smoking 03/27/2020.  She recently had a dream where her sister, who is dead, was shopping with her and there shopping bags got exchanged and when she opened her sisters shopping back there were 5 packs of cigarettes in it.  A detailed review of systems today was otherwise stable   COVID 19 VACCINATION STATUS: Status  post Pfizer x2 but does not want the booster because she is afraid of Guillain-Barr syndrome   HISTORY OF CURRENT ILLNESS: From the original intake note:  Margaret Hodges had routine screening mammography on 01/03/2018 showing a possible abnormality in the left breast. She underwent bilateral diagnostic mammography with tomography and left breast ultrason[facility] on 01[facility]15[facility]2018 showing: Breast density category B; a 1.3 cm round solid mass with an indistinct margin in the left breast at 7 o'clock posterior 8 cm from the nipple. No significant abnormalities found in the left axilla.  Accordingly on 01/16/2018 she proceeded to biopsy of the left breast area in question. The pathology from this procedure showed [phone]): Invasive ductal carcinoma, grade III. Prognostic indicators significant for: estrogen receptor, 60% positive, with weak staining intensity; progesterone receptor negative, Proliferation marker Ki67 at 80%. HER2 positive by immunohistochemistry, 3+.  The patient's subsequent history is as detailed below.   PAST MEDICAL HISTORY: Past Medical History:  Diagnosis Date   Anginal pain ([place])    with admission to hospital due to high blood pressure   Complication of anesthesia    Family history of ovarian cancer    GERD (gastroesophageal reflux disease)    Guillain Barr syndrome ([place]) 1959   Guillain Barr syndrome ([place]) 1959   Guillain Barr syndrome ([place]) 1959   Headache    sinus headaches   History of hiatal hernia    was told pt. had one, but no testing   Hypertensive urgency 04/05/2014   Malignant neoplasm of lower-inner quadrant of left breast in female, estrogen receptor positive (  [place]) 01/24/2018   Neuromuscular disorder ([place])    Guillian- Barre Syndrome   Pneumonia    hx. of walking pneumonia   PONV (postoperative nausea and vomiting)     PAST SURGICAL HISTORY: Past Surgical History:  Procedure Laterality Date   ABDOMINAL HYSTERECTOMY     Total    Bladder lift  15 years ago,  Uterus prolapse causing intestinal damage and surgery     BREAST LUMPECTOMY Left 02/18/2018   BREAST LUMPECTOMY WITH RADIOACTIVE SEED AND SENTINEL LYMPH NODE BIOPSY Left 02/18/2018   Procedure: LEFT BREAST LUMPECTOMY WITH RADIOACTIVE SEED AND LEFT AXILLARY DEEP SENTINEL LYMPH NODE BIOPSY, INJECT BLUE DYE LEFT BREAST;  Surgeon: Fanny Skates, MD;  Location: [facility];  Service: General;  Laterality: Left;   PORT-A-CATH REMOVAL N/A 03/17/2019   Procedure: REMOVAL PORT-A-CATH;  Surgeon: Fanny Skates, MD;  Location: [facility];  Service: General;  Laterality: N/A;   PORTACATH PLACEMENT Right 02/18/2018   Procedure: INSERTION PORT-A-CATH;  Surgeon: Fanny Skates, MD;  Location: [facility];  Service: General;  Laterality: Right;   Right torn meniscus repair x 2     THYROID LOBECTOMY N/A 02/04/2015   Procedure: TOTAL THYROIDECTOMY;  Surgeon: Armandina Gemma, MD;  [facility];  Service: General;  Laterality: N/A;   TONSILLECTOMY     TUMOR REMOVAL     Tumors on arms removed,  Tumor on gum removed,  Tumor removed from inside ear      FAMILY HISTORY Family History  Problem Relation Age of Onset   Ovarian cancer Sister    Lung cancer Sister    Throat cancer Brother    Brain cancer Sister   She notes that her father died from old age at age 36. Patients' mother died from multiple comorbidities at age 17. The patient has 5 brothers and 6 sisters. Patients' sister had ovarian cancer at age 57 and lung cancer at 23, brother with throat cancer at age 39, and sister with brain cancer at age 73.    GYNECOLOGIC HISTORY:  No LMP recorded. Patient has had a hysterectomy. 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 Her husband was a Librarian, academic at [employer].  He was an Civil Service fast streamer who developed supranuclear palsy and died early in the morning of 2020-01-15. Her son, Luther Newhouse, manages a call center for medical supplies. Her daughter, [name], is Patient Care surgical coordinator at [facility] [employer] clinic.  The patient has 2 grandchildren. She attends [employer].    ADVANCED DIRECTIVES: Her [place] is her daughter, [name] and can be reached at [phone].     HEALTH MAINTENANCE: Social History   Tobacco Use   Smoking status: Former    Packs/day: 1.50    Types: Cigarettes   Smokeless tobacco: Never  Substance Use Topics   Alcohol use: No   Drug use: No     Colonoscopy: Yes through [facility]  PAP:   Bone density:    Allergies  Allergen Reactions   Allegra [Fexofenadine] Hives   Clindamycin/Lincomycin Hives and Diarrhea   Influenza A (H1n1) Monoval Vac Other (See Comments)    Guillian Barre Syndrome   Keflex [Cephalexin] Hives   Penicillins Swelling  Has patient had a PCN reaction causing immediate rash, facial/tongue/throat swelling, SOB or lightheadedness with hypotension: No Has patient had a PCN reaction causing severe rash involving mucus membranes or skin necrosis: No Has patient had a PCN reaction that required hospitalization No Has patient had a PCN reaction occurring within the last 10 years: No If all of the above answers are "NO", then may proceed with Cephalosporin use.    Prednisone Other (See Comments)    "heart attack symptoms" but even had heart cath & had no cardiac disease   Statins Other (See Comments)    myalgias   Influenza Vaccines Other (See Comments)   Other     Other reaction(s): Leg pain Other reaction(s): hives    Current Outpatient Medications  Medication Sig Dispense Refill   amLODipine (NORVASC) 5 MG tablet Take 1 tablet (5 mg total) by mouth daily. 30 tablet 0    Cholecalciferol (VITAMIN D) 2000 UNITS tablet Take 2,000 Units by mouth daily.     levothyroxine (SYNTHROID, LEVOTHROID) 100 MCG tablet Take 100 mcg by mouth daily.     omeprazole (PRILOSEC) 40 MG capsule TAKE 1 CAPSULE BY MOUTH EVERY DAY 90 capsule 3   Probiotic Product (PROBIOTIC-10 ULTIMATE PO) Take by mouth.     vitamin B-12 (CYANOCOBALAMIN) 1000 MCG tablet Take 1,000 mcg by mouth daily.     No current facility-administered medications for this visit.    OBJECTIVE: Vitals:   09/14/21 1425  BP: (!) 151/76  Pulse: 89  Resp: 16  Temp: 97.7 F (36.5 C)  SpO2: 98%     Body mass index is 26.81 kg/m.   Wt Readings from Last 3 Encounters:  09/14/21 176 lb 5 oz (80 kg)  09/08/20 175 lb 3.2 oz (79.5 kg)  06/04/20 168 lb (76.2 kg)  ECOG FS:1 - Symptomatic but completely ambulatory  Physical Exam Constitutional:      Appearance: Normal appearance.  Cardiovascular:     Rate and Rhythm: Normal rate and regular rhythm.  Chest:     Comments: Left breast status postlumpectomy.  Some peau d orange changes noted Musculoskeletal:     Cervical back: Normal range of motion and neck supple. No rigidity.  Lymphadenopathy:     Cervical: No cervical adenopathy.  Neurological:     Mental Status: She is alert.       LAB RESULTS:  CMP     Component Value Date/Time   NA 140 09/08/2020 1417   K 4.4 09/08/2020 1417   CL 107 09/08/2020 1417   CO2 22 09/08/2020 1417   GLUCOSE 113 (H) 09/08/2020 1417   BUN 17 09/08/2020 1417   CREATININE 0.80 09/08/2020 1417   CREATININE 0.81 06/04/2020 1139   CALCIUM 9.9 09/08/2020 1417   PROT 7.8 09/08/2020 1417   ALBUMIN 3.8 09/08/2020 1417   AST 14 (L) 09/08/2020 1417   AST 15 06/04/2020 1139   ALT 18 09/08/2020 1417   ALT 20 06/04/2020 1139   ALKPHOS 90 09/08/2020 1417   BILITOT 0.2 (L) 09/08/2020 1417   BILITOT 0.4 06/04/2020 1139   GFRNONAA >60 09/08/2020 1417   GFRNONAA >60 06/04/2020 1139   GFRAA >60 12/17/2019 1323   GFRAA >60  08/23/2018 0828    Lab Results  Component Value Date   TOTALPROTELP 7.5 05/08/2017    No results found for: "KPAFRELGTCHN", "LAMBDASER", "KAPLAMBRATIO"  Lab Results  Component Value Date   WBC 10.1 09/14/2021   NEUTROABS 6.3 09/14/2021   HGB 14.2 09/14/2021   HCT  41.7 09/14/2021   MCV 97.0 09/14/2021   PLT 286 09/14/2021   No results found for: "LABCA2"  No components found for: "ZOXWRU045"  No results for input(s): "INR" in the last 168 hours.  No results found for: "LABCA2"  No results found for: "WUJ811"  No results found for: "CAN125"  No results found for: "CAN153"  No results found for: "CA2729"  No components found for: "HGQUANT"  No results found for: "CEA1", "CEA" / No results found for: "CEA1", "CEA"   No results found for: "AFPTUMOR"  No results found for: "CHROMOGRNA"  No results found for: "HGBA", "HGBA2QUANT", "HGBFQUANT", "HGBSQUAN" (Hemoglobinopathy evaluation)   Lab Results  Component Value Date   LDH 171 05/08/2017    No results found for: "IRON", "TIBC", "IRONPCTSAT" (Iron and TIBC)  No results found for: "FERRITIN"  Urinalysis    Component Value Date/Time   COLORURINE YELLOW 09/08/2020 [place] 09/08/2020 1540   LABSPEC 1.024 09/08/2020 1540   PHURINE 5.0 09/08/2020 1540   GLUCOSEU NEGATIVE 09/08/2020 1540   HGBUR NEGATIVE 09/08/2020 1540   BILIRUBINUR NEGATIVE 09/08/2020 1540   KETONESUR NEGATIVE 09/08/2020 1540   PROTEINUR NEGATIVE 09/08/2020 1540   UROBILINOGEN 0.2 04/04/2014 2112   NITRITE NEGATIVE 09/08/2020 [place] 09/08/2020 1540    STUDIES:  No results found.    ELIGIBLE FOR AVAILABLE RESEARCH PROTOCOL: no   ASSESSMENT: 76 y.o. [place] woman status post left breast lower inner quadrant biopsy 01/16/2018 for a clinical T1c N0, stage IA invasive ductal carcinoma, grade 3, estrogen receptor weakly positive, progesterone receptor negative, with an MIB-1 of 80%, and HER-2  amplified  (1) genetics 03/06/2018 showed no deleterious mutations  (2) status post left lumpectomy and sentinel lymph node sampling 02/18/2018 for a pT2 pN0, stage IIA invasive ductal carcinoma, grade 3, with negative margins.  (a) a total of 5 lymph nodes were removed   (3) adjuvant chemo immunotherapy consisting of paclitaxel/trastuzumab weekly for 12 weeks, started 03/08/2018.  (a) paclitaxel discontinued after 6 doses because of peripheral neuropathy; last dose 04/12/2018  (4) trastuzumab continued to complete a year (last dose 02/25/2019)  (a) echocardiogram 02/06/2018 shows an ejection fraction in the 60-65% range  (b) echocardiogram 06/06/2018 shows an ejection fraction in the 60-65% range  (c) echocardiogram 09/09/2018 shows an ejection fraction in the 60-65% range  (d) echocardiogram 12/16/2018 shows an ejection fraction in the 60-65% range  (5) adjuvant radiation 05/30/2018 - 06/26/2018  (a) Left Breast / 42.56 Gy in 16 fractions  (b) Boost / 8 Gy in 4 fractions  (6) anastrozole started June 2020, discontinued by patient Sept 2021 due to concerns regarding side effects  (a) bone density at [facility] 12/27/2017 shows a T score of -1.7  (7) tobacco abuse disorder: Bari stopped smoking 06/26/2018, later resumed, quit again 03/27/2020   PLAN: Patient is here for surveillance.  She is doing well.  She denies any new breast changes except for intermittent breast pain in the left breast and she wears compression bra which relieves the pain.  Last mammogram in November 2022 with no concerns of malignancy. At this time she is only on observation, she could not tolerate anastrozole. Repeat mammogram in November 2023.  No other concerning review of systems or physical examination findings for recurrence.  We have discussed about returning to clinic in 1 year or sooner as needed.  She expressed understanding of all the recommendations.   *Total Encounter Time as defined by the  Centers  for Medicare and Medicaid Services includes, in addition to the face-to-face time of a patient visit (documented in the note above) non-face-to-face time: obtaining and reviewing outside history, ordering and reviewing medications, tests or procedures, care coordination (communications with other health care professionals or caregivers) and documentation in the medical record.

## 2021-11-18 DIAGNOSIS — N3010 Interstitial cystitis (chronic) without hematuria: Secondary | ICD-10-CM | POA: Diagnosis not present

## 2021-11-18 DIAGNOSIS — I471 Supraventricular tachycardia: Secondary | ICD-10-CM | POA: Diagnosis not present

## 2021-11-18 DIAGNOSIS — E039 Hypothyroidism, unspecified: Secondary | ICD-10-CM | POA: Diagnosis not present

## 2021-11-18 DIAGNOSIS — G894 Chronic pain syndrome: Secondary | ICD-10-CM | POA: Diagnosis not present

## 2021-11-18 DIAGNOSIS — F17211 Nicotine dependence, cigarettes, in remission: Secondary | ICD-10-CM | POA: Diagnosis not present

## 2021-11-18 DIAGNOSIS — R7303 Prediabetes: Secondary | ICD-10-CM | POA: Diagnosis not present

## 2021-11-18 DIAGNOSIS — R42 Dizziness and giddiness: Secondary | ICD-10-CM | POA: Diagnosis not present

## 2021-11-23 ENCOUNTER — Other Ambulatory Visit: Payer: Self-pay | Admitting: Family Medicine

## 2021-11-23 DIAGNOSIS — F17211 Nicotine dependence, cigarettes, in remission: Secondary | ICD-10-CM

## 2021-12-16 ENCOUNTER — Ambulatory Visit
Admission: RE | Admit: 2021-12-16 | Discharge: 2021-12-16 | Disposition: A | Discharge status: Home / Self Care | Payer: Medicare Other | Source: Ambulatory Visit | Attending: Family Medicine | Admitting: Family Medicine

## 2021-12-16 DIAGNOSIS — F17211 Nicotine dependence, cigarettes, in remission: Secondary | ICD-10-CM

## 2021-12-16 DIAGNOSIS — Z87891 Personal history of nicotine dependence: Secondary | ICD-10-CM | POA: Diagnosis not present

## 2022-01-17 DIAGNOSIS — L237 Allergic contact dermatitis due to plants, except food: Secondary | ICD-10-CM | POA: Diagnosis not present

## 2022-01-17 DIAGNOSIS — J0100 Acute maxillary sinusitis, unspecified: Secondary | ICD-10-CM | POA: Diagnosis not present

## 2022-01-31 ENCOUNTER — Encounter: Payer: Self-pay | Admitting: Cardiology

## 2022-01-31 ENCOUNTER — Ambulatory Visit: Payer: Medicare Other | Attending: Cardiology | Admitting: Cardiology

## 2022-01-31 ENCOUNTER — Ambulatory Visit (INDEPENDENT_AMBULATORY_CARE_PROVIDER_SITE_OTHER): Payer: Medicare Other

## 2022-01-31 VITALS — BP 130/70 | HR 82 | Ht 68.0 in | Wt 167.0 lb

## 2022-01-31 DIAGNOSIS — R002 Palpitations: Secondary | ICD-10-CM

## 2022-01-31 DIAGNOSIS — I700 Atherosclerosis of aorta: Secondary | ICD-10-CM | POA: Diagnosis not present

## 2022-01-31 DIAGNOSIS — I10 Essential (primary) hypertension: Secondary | ICD-10-CM | POA: Diagnosis not present

## 2022-01-31 NOTE — Progress Notes (Signed)
Cardiology Office Note:    Date:  01/31/2022   ID:  TAMSIN NADER, DOB 1945/05/17, MRN [id number]  PCP:  [name], MD   [facility] Providers Cardiologist:  Candee Furbish, MD     Referring MD: [name], MD   History of Present Illness:    Margaret Hodges is a 76 y.o. female here for the evaluation of tachycardia and dizziness at the request of [name], MD.   She has a history of hypertension, anginal pain, hypercholesterolemia, chronic pain bilateral knees and back, and neck DDD.   As of her last visit with Dr. [name] on 01/02/2022, she had been stable on her medications. She complained of pressure in her head and sinus and feeling lightheaded and dizzy. She was advised to start aspirin, 81 mg once a day and check blood pressure and heart rate at home. She was referred to cardiology.  She had a history of smoking more than 30 pack per year and quit in 2022.  Today, present feeling overall well. She occasionally experiences episodes where she feels very warm or flushed, short of breath, and describes a "funny feeling".   She likes to stay very active by running errands on her own, walks her neighbors dogs, and walks around. She wakes up early and tries to not stay in bed long before starting her day. She has not been wearing monitor, but is open to wearing it in the future.  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 She noted that her parents did not handle it well. She reported that out of 12 siblings, she is the only one alive.   She denies any palpitations, chest pain, or peripheral edema. No lightheadedness, headaches, syncope, orthopnea, or PND.  Past Medical History:  Diagnosis Date   Anginal pain ([place])    with admission to hospital due to high blood pressure   Anxiety    Complication of anesthesia    Dizziness    Family history of ovarian cancer    GERD (gastroesophageal reflux disease)    Guillain Barr  syndrome ([place]) 1959   Guillain Barr syndrome ([place]) 1959   Guillain Barr syndrome ([place]) 1959   Headache    sinus headaches   History of hiatal hernia    was told pt. had one, but no testing   Hypercholesterolemia    Hypertensive urgency 04/05/2014   IBS (irritable bowel syndrome)    Malignant neoplasm of lower-inner quadrant of left breast in female, estrogen receptor positive ([place]) 01/24/2018   Neuromuscular disorder ([place])    Guillian- Barre Syndrome   Osteoarthritis    Pneumonia    hx. of walking pneumonia   PONV (postoperative nausea and vomiting)    Vitamin D deficiency     Past Surgical History:  Procedure Laterality Date   ABDOMINAL HYSTERECTOMY     Total   Bladder lift  15 years ago,  Uterus prolapse causing intestinal damage and surgery     BREAST LUMPECTOMY Left 02/18/2018   BREAST LUMPECTOMY WITH RADIOACTIVE SEED AND SENTINEL LYMPH NODE BIOPSY Left 02/18/2018   Procedure: LEFT BREAST LUMPECTOMY WITH RADIOACTIVE SEED AND LEFT AXILLARY DEEP SENTINEL LYMPH NODE BIOPSY, INJECT BLUE DYE LEFT BREAST;  Surgeon: Fanny Skates, MD;  Location: [place];  Service: General;  Laterality: Left;   PORT-A-CATH REMOVAL N/A 03/17/2019   Procedure: REMOVAL PORT-A-CATH;  Surgeon: Fanny Skates, MD;  Location: [place];  Service: General;  Laterality: N/A;   PORTACATH PLACEMENT Right 02/18/2018   Procedure: INSERTION PORT-A-CATH;  Surgeon: Fanny Skates, MD;  Location: [facility];  Service: General;  Laterality: Right;   Right torn meniscus repair x 2     THYROID LOBECTOMY N/A 02/04/2015   Procedure: TOTAL THYROIDECTOMY;  Surgeon: Armandina Gemma, MD;  Location: [facility];  Service: General;  Laterality: N/A;   TONSILLECTOMY     TUMOR REMOVAL     Tumors on arms removed,  Tumor on gum removed,  Tumor removed from inside ear      Current Medications: Current Meds  Medication Sig   amLODipine (NORVASC) 5 MG tablet Take 1 tablet (5 mg  total) by mouth daily.   Cholecalciferol (VITAMIN D) 2000 UNITS tablet Take 2,000 Units by mouth daily.   levothyroxine (SYNTHROID, LEVOTHROID) 100 MCG tablet Take 100 mcg by mouth daily.   omeprazole (PRILOSEC) 40 MG capsule TAKE 1 CAPSULE BY MOUTH EVERY DAY   Probiotic Product (PROBIOTIC-10 ULTIMATE PO) Take by mouth.   vitamin B-12 (CYANOCOBALAMIN) 1000 MCG tablet Take 1,000 mcg by mouth daily.     Allergies:   Allegra [fexofenadine], Clindamycin/lincomycin, Influenza a (h1n1) monoval vac, Keflex [cephalexin], Penicillins, Prednisone, Statins, Influenza vaccines, and Other   Social History   Socioeconomic History   Marital status: Married    Spouse name: Not on file   Number of children: Not on file   Years of education: Not on file   Highest education level: Not on file  Occupational History   Not on file  Tobacco Use   Smoking status: Former    Packs/day: 1.50    Types: Cigarettes   Smokeless tobacco: Never  Substance and Sexual Activity   Alcohol use: No   Drug use: No   Sexual activity: Not Currently  Other Topics Concern   Not on file  Social History Narrative   Not on file   Social Determinants of Health   Financial Resource Strain: Not on file  Food Insecurity: Not on file  Transportation Needs: Not on file  Physical Activity: Not on file  Stress: Not on file  Social Connections: Not on file     Family History: The patient's family history includes Alcohol abuse in her brother; Brain cancer in her sister; Cancer in her father; Heart disease in her mother; Kidney failure in her mother; Lung cancer in her sister; Ovarian cancer in her sister; Throat cancer in her brother.  ROS:   Please see the history of present illness.    (+) Shortness of breath (+) Feeling flushed All other systems reviewed and are negative.  EKGs/Labs/Other Studies Reviewed:    The following studies were reviewed today:  LE Venous (DVT) 06/04/2020: Summary:  RIGHT:  - There is  no evidence of deep vein thrombosis in the lower extremity.  - No cystic structure found in the popliteal fossa.    LEFT:  - No evidence of common femoral vein obstruction.    EKG:  EKG is personally reviewed and interpreted. 01/31/2022: Sinus rhythm. Rate 82 bpm. LAFB. 05/05/2020: SR 77 bpm, no change from prior (personally reviewed)   Recent Labs: 09/14/2021: ALT 43; BUN 19; Creatinine 0.82; Hemoglobin 14.2; Platelet Count 286; Potassium 4.1; Sodium 141   Recent Lipid Panel    Component Value Date/Time   CHOL 245 (H) 04/06/2014 0245   TRIG 229 (H) 04/06/2014 0245   HDL 42 04/06/2014 0245   CHOLHDL 5.8 04/06/2014 0245   VLDL 46 (H) 04/06/2014 0245  LDLCALC 157 (H) 04/06/2014 0245     Risk Assessment/Calculations:          Physical Exam:    VS:  BP 130/70 (BP Location: Left Arm, Patient Position: Sitting, Cuff Size: Normal)   Pulse 82   Ht '5\' 8"'$  (1.727 m)   Wt 167 lb (75.8 kg)   SpO2 94%   BMI 25.39 kg/m     Wt Readings from Last 3 Encounters:  01/31/22 167 lb (75.8 kg)  09/14/21 176 lb 5 oz (80 kg)  09/08/20 175 lb 3.2 oz (79.5 kg)     GEN: Well nourished, well developed in no acute distress HEENT: Normal NECK: No JVD; No carotid bruits LYMPHATICS: No lymphadenopathy CARDIAC: RRR, no murmurs, rubs, gallops RESPIRATORY:  Clear to auscultation without rales, wheezing or rhonchi  ABDOMEN: Soft, non-tender, non-distended MUSCULOSKELETAL:  No edema; No deformity  SKIN: Warm and dry NEUROLOGIC:  Alert and oriented x 3 PSYCHIATRIC:  Normal affect   ASSESSMENT:    1. Palpitations   2. Essential hypertension   3. Aortic atherosclerosis (HCC)    PLAN:    In order of problems listed above:  Palpitations/dizziness - We will go ahead and check a Zio patch monitor to ensure that there are no adverse arrhythmias present.  Prior chest pressure - No recent symptoms.  Left heart cath in 2012 with Dr. Irish Lack showed no CAD.  Stress test in 2016 was negative.  Echo  in 2020 showed EF 65%.  No longer smoker.  Aortic atherosclerosis - Noted on CT scan personally reviewed and interpreted.  I do not see any significant coronary artery calcification however.  Left breast cancer - Invasive ductal carcinoma grade 3.  EF remains stable with Herceptin.  Hypertension - Amlodipine.  Doing well.  Mixed hyperlipidemia - States that she is unable to take a statin.  Continue with diet and exercise.  Left anterior fascicular block - We will check EKG again in 1 year.  Watch for any signs of syncope.    Follow up in 1 year  Medication Adjustments/Labs and Tests Ordered: Current medicines are reviewed at length with the patient today.  Concerns regarding medicines are outlined above.   Orders Placed This Encounter  Procedures   LONG TERM MONITOR (3-14 DAYS)   EKG 12-Lead   No orders of the defined types were placed in this encounter.  Patient Instructions  Medication Instructions:  The current medical regimen is effective;  continue present plan and medications.  *If you need a refill on your cardiac medications before your next appointment, please call your pharmacy*  Testing/Procedures: [place] Monitor Instructions  Your physician has requested you wear a ZIO patch monitor for 14 days.  This is a single patch monitor. [employer] supplies one patch monitor per enrollment. Additional stickers are not available. Please do not apply patch if you will be having a Nuclear Stress Test,  Echocardiogram, Cardiac CT, MRI, or Chest Xray during the period you would be wearing the  monitor. The patch cannot be worn during these tests. You cannot remove and re-apply the  ZIO XT patch monitor.  Your ZIO patch monitor will be mailed 3 day USPS to your address on file. It may take 3-5 days  to receive your monitor after you have been enrolled.  Once you have received your monitor, please review the enclosed instructions. Your monitor  has already been  registered assigning a specific monitor serial # to you.  Billing and Patient Assistance  Program Information  We have supplied [employer] with any of your insurance information on file for billing purposes. [employer] offers a sliding scale Patient Assistance Program for patients that do not have  insurance, or whose insurance does not completely cover the cost of the ZIO monitor.  You must apply for the Patient Assistance Program to qualify for this discounted rate.  To apply, please call [employer] at [phone], select option 4, select option 2, ask to apply for  Patient Assistance Program. Theodore Demark will ask your household income, and how many people  are in your household. They will quote your out-of-pocket cost based on that information.  [employer] will also be able to set up a 94-month interest-free payment plan if needed.  Applying the monitor   Shave hair from upper left chest.  Hold abrader disc by orange tab. Rub abrader in 40 strokes over the upper left chest as  indicated in your monitor instructions.  Clean area with 4 enclosed alcohol pads. Let dry.  Apply patch as indicated in monitor instructions. Patch will be placed under collarbone on left  side of chest with arrow pointing upward.  Rub patch adhesive wings for 2 minutes. Remove white label marked "1". Remove the white  label marked "2". Rub patch adhesive wings for 2 additional minutes.  While looking in a mirror, press and release button in center of patch. A small green light will  flash 3-4 times. This will be your only indicator that the monitor has been turned on.  Do not shower for the first 24 hours. You may shower after the first 24 hours.  Press the button if you feel a symptom. You will hear a small click. Record Date, Time and  Symptom in the Patient Logbook.  When you are ready to remove the patch, follow instructions on the last 2 pages of Patient  Logbook. Stick patch monitor onto the last page of Patient  Logbook.  Place Patient Logbook in the blue and white box. Use locking tab on box and tape box closed  securely. The blue and white box has prepaid postage on it. Please place it in the mailbox as  soon as possible. Your physician should have your test results approximately 7 days after the  monitor [facility]  Call IPontiacat [phone] you have questions regarding  your ZIO XT patch monitor. Call them immediately if you see an orange light blinking on your  monitor.  If your monitor falls off in less than 4 days, contact our Monitor department at [phone]  If your monitor becomes loose or falls off after 4 days call [employer] at [phone]  suggestions on securing your monitor  Follow-Up: [facility] and your [facility] needs are our priority.  As part of our continuing mission to provide you with exceptional heart care, we have created designated Provider Care Teams.  These Care Teams include your primary Cardiologist (physician) and Advanced Practice Providers (APPs -  Physician Assistants and Nurse Practitioners) who all work together to provide you with the care you need, when you need it.  We recommend signing up for the patient portal called "MyChart".  Sign up information is provided on this After Visit Summary.  MyChart is used to connect with patients for Virtual Visits (Telemedicine).  Patients are able to view lab/test results, encounter notes, upcoming appointments, etc.  Non-urgent messages can be sent to your provider as well.   To  learn more about what you can do with MyChart, go to [email or web address].    Your next appointment:   1 year(s)  The format for your next appointment:   In Person  Provider:   Candee Furbish, MD      Important Information About Sugar         I,Rachel Rivera,acting as a scribe for Candee Furbish, MD.,have documented all relevant documentation on the behalf of  Candee Furbish, MD,as directed by  Candee Furbish, MD while in the presence of Candee Furbish, MD.  I, Candee Furbish, MD, have reviewed all documentation for this visit. The documentation on 01/31/22 for the exam, diagnosis, procedures, and orders are all accurate and complete.   Signed, Candee Furbish, MD  01/31/2022 2:30 PM    [place]

## 2022-01-31 NOTE — Progress Notes (Unsigned)
Enrolled patient for a 14 day Zio XT  monitor to be mailed to patients home  °

## 2022-01-31 NOTE — Patient Instructions (Signed)
Medication Instructions:  The current medical regimen is effective;  continue present plan and medications.  *If you need a refill on your cardiac medications before your next appointment, please call your pharmacy*  Testing/Procedures: [place] Monitor Instructions  Your physician has requested you wear a ZIO patch monitor for 14 days.  This is a single patch monitor. [employer] supplies one patch monitor per enrollment. Additional stickers are not available. Please do not apply patch if you will be having a Nuclear Stress Test,  Echocardiogram, Cardiac CT, MRI, or Chest Xray during the period you would be wearing the  monitor. The patch cannot be worn during these tests. You cannot remove and re-apply the  ZIO XT patch monitor.  Your ZIO patch monitor will be mailed 3 day USPS to your address on file. It may take 3-5 days  to receive your monitor after you have been enrolled.  Once you have received your monitor, please review the enclosed instructions. Your monitor  has already been registered assigning a specific monitor serial # to you.  Billing and Patient Assistance Program Information  We have supplied [employer] with any of your insurance information on file for billing purposes. [employer] offers a sliding scale Patient Assistance Program for patients that do not have  insurance, or whose insurance does not completely cover the cost of the ZIO monitor.  You must apply for the Patient Assistance Program to qualify for this discounted rate.  To apply, please call [employer] at [phone], select option 4, select option 2, ask to apply for  Patient Assistance Program. Theodore Demark will ask your household income, and how many people  are in your household. They will quote your out-of-pocket cost based on that information.  [employer] will also be able to set up a 24-month interest-free payment plan if needed.  Applying the monitor   Shave hair from upper left chest.  Hold abrader disc  by orange tab. Rub abrader in 40 strokes over the upper left chest as  indicated in your monitor instructions.  Clean area with 4 enclosed alcohol pads. Let dry.  Apply patch as indicated in monitor instructions. Patch will be placed under collarbone on left  side of chest with arrow pointing upward.  Rub patch adhesive wings for 2 minutes. Remove white label marked "1". Remove the white  label marked "2". Rub patch adhesive wings for 2 additional minutes.  While looking in a mirror, press and release button in center of patch. A small green light will  flash 3-4 times. This will be your only indicator that the monitor has been turned on.  Do not shower for the first 24 hours. You may shower after the first 24 hours.  Press the button if you feel a symptom. You will hear a small click. Record Date, Time and  Symptom in the Patient Logbook.  When you are ready to remove the patch, follow instructions on the last 2 pages of Patient  Logbook. Stick patch monitor onto the last page of Patient Logbook.  Place Patient Logbook in the blue and white box. Use locking tab on box and tape box closed  securely. The blue and white box has prepaid postage on it. Please place it in the mailbox as  soon as possible. Your physician should have your test results approximately 7 days after the  monitor has been [facility]  Call IWil[place]at [phone] you have questions regarding  your ZIO XT patch  monitor. Call them immediately if you see an orange light blinking on your  monitor.  If your monitor falls off in less than 4 days, contact our Monitor department at [phone].  If your monitor becomes loose or falls off after 4 days call [employer] at [phone] for  suggestions on securing your monitor  Follow-Up: At [facility], you and your health needs are our priority.  As part of our continuing mission to provide you with exceptional heart care, we  have created designated Provider Care Teams.  These Care Teams include your primary Cardiologist (physician) and Advanced Practice Providers (APPs -  Physician Assistants and Nurse Practitioners) who all work together to provide you with the care you need, when you need it.  We recommend signing up for the patient portal called "MyChart".  Sign up information is provided on this After Visit Summary.  MyChart is used to connect with patients for Virtual Visits (Telemedicine).  Patients are able to view lab/test results, encounter notes, upcoming appointments, etc.  Non-urgent messages can be sent to your provider as well.   To learn more about what you can do with MyChart, go to [email or web address].    Your next appointment:   1 year(s)  The format for your next appointment:   In Person  Provider:   Candee Furbish, MD      Important Information About Sugar

## 2022-02-03 DIAGNOSIS — R002 Palpitations: Secondary | ICD-10-CM | POA: Diagnosis not present

## 2022-02-25 DIAGNOSIS — R002 Palpitations: Secondary | ICD-10-CM | POA: Diagnosis not present

## 2022-03-04 DIAGNOSIS — Z1231 Encounter for screening mammogram for malignant neoplasm of breast: Secondary | ICD-10-CM | POA: Diagnosis not present

## 2022-05-30 DIAGNOSIS — R7303 Prediabetes: Secondary | ICD-10-CM | POA: Diagnosis not present

## 2022-05-30 DIAGNOSIS — E039 Hypothyroidism, unspecified: Secondary | ICD-10-CM | POA: Diagnosis not present

## 2022-05-30 DIAGNOSIS — E785 Hyperlipidemia, unspecified: Secondary | ICD-10-CM | POA: Diagnosis not present

## 2022-05-30 DIAGNOSIS — Z79899 Other long term (current) drug therapy: Secondary | ICD-10-CM | POA: Diagnosis not present

## 2022-05-30 DIAGNOSIS — E559 Vitamin D deficiency, unspecified: Secondary | ICD-10-CM | POA: Diagnosis not present

## 2022-06-01 DIAGNOSIS — E785 Hyperlipidemia, unspecified: Secondary | ICD-10-CM | POA: Diagnosis not present

## 2022-06-01 DIAGNOSIS — G620 Drug-induced polyneuropathy: Secondary | ICD-10-CM | POA: Diagnosis not present

## 2022-06-01 DIAGNOSIS — I129 Hypertensive chronic kidney disease with stage 1 through stage 4 chronic kidney disease, or unspecified chronic kidney disease: Secondary | ICD-10-CM | POA: Diagnosis not present

## 2022-06-01 DIAGNOSIS — R7303 Prediabetes: Secondary | ICD-10-CM | POA: Diagnosis not present

## 2022-06-01 DIAGNOSIS — E039 Hypothyroidism, unspecified: Secondary | ICD-10-CM | POA: Diagnosis not present

## 2022-06-01 DIAGNOSIS — J439 Emphysema, unspecified: Secondary | ICD-10-CM | POA: Diagnosis not present

## 2022-06-01 DIAGNOSIS — Z79899 Other long term (current) drug therapy: Secondary | ICD-10-CM | POA: Diagnosis not present

## 2022-06-01 DIAGNOSIS — M1990 Unspecified osteoarthritis, unspecified site: Secondary | ICD-10-CM | POA: Diagnosis not present

## 2022-06-01 DIAGNOSIS — Z0000 Encounter for general adult medical examination without abnormal findings: Secondary | ICD-10-CM | POA: Diagnosis not present

## 2022-06-01 DIAGNOSIS — R7301 Impaired fasting glucose: Secondary | ICD-10-CM | POA: Diagnosis not present

## 2022-06-01 DIAGNOSIS — I700 Atherosclerosis of aorta: Secondary | ICD-10-CM | POA: Diagnosis not present

## 2022-06-01 DIAGNOSIS — E559 Vitamin D deficiency, unspecified: Secondary | ICD-10-CM | POA: Diagnosis not present

## 2022-08-09 ENCOUNTER — Other Ambulatory Visit: Payer: Self-pay | Admitting: Hematology and Oncology

## 2022-08-23 DIAGNOSIS — R110 Nausea: Secondary | ICD-10-CM | POA: Diagnosis not present

## 2022-08-23 DIAGNOSIS — S50861A Insect bite (nonvenomous) of right forearm, initial encounter: Secondary | ICD-10-CM | POA: Diagnosis not present

## 2022-08-23 DIAGNOSIS — A6920 Lyme disease, unspecified: Secondary | ICD-10-CM | POA: Diagnosis not present

## 2022-08-23 DIAGNOSIS — R519 Headache, unspecified: Secondary | ICD-10-CM | POA: Diagnosis not present

## 2022-08-23 DIAGNOSIS — M795 Residual foreign body in soft tissue: Secondary | ICD-10-CM | POA: Diagnosis not present

## 2022-09-07 DIAGNOSIS — L570 Actinic keratosis: Secondary | ICD-10-CM | POA: Diagnosis not present

## 2022-09-07 DIAGNOSIS — L821 Other seborrheic keratosis: Secondary | ICD-10-CM | POA: Diagnosis not present

## 2022-09-14 ENCOUNTER — Other Ambulatory Visit: Payer: Self-pay | Admitting: *Deleted

## 2022-09-14 DIAGNOSIS — C50312 Malignant neoplasm of lower-inner quadrant of left female breast: Secondary | ICD-10-CM

## 2022-09-15 ENCOUNTER — Telehealth: Payer: Self-pay | Admitting: *Deleted

## 2022-09-15 ENCOUNTER — Inpatient Hospital Stay: Payer: Medicare Other

## 2022-09-15 ENCOUNTER — Inpatient Hospital Stay: Payer: Medicare Other | Admitting: Hematology and Oncology

## 2022-09-15 NOTE — Telephone Encounter (Signed)
This RN called pt to follow up on missed appt today - which pt states she called and canceled it yesterday (by VM system).  Per discussion of follow up plan is for pt to now participate in survivorship - she states she would like labs reviewed with her at the visit and discuss any new testing or concerns related to her history of breast cancer.  Note pt is now under surveillance only post diagnosis in 2019 for breast cancer - s/p  lumpectomy chemo,radiation and started aromatase inhibitor stopping in 2020 due to intolerance.  This RN cancelled appts for today to reflect that she was not a no show.  Request sent to scheduling for appt with Survivor [facility].

## 2022-12-06 DIAGNOSIS — M1990 Unspecified osteoarthritis, unspecified site: Secondary | ICD-10-CM | POA: Diagnosis not present

## 2022-12-06 DIAGNOSIS — J309 Allergic rhinitis, unspecified: Secondary | ICD-10-CM | POA: Diagnosis not present

## 2023-02-07 DIAGNOSIS — N3000 Acute cystitis without hematuria: Secondary | ICD-10-CM | POA: Diagnosis not present

## 2023-02-07 DIAGNOSIS — F430 Acute stress reaction: Secondary | ICD-10-CM | POA: Diagnosis not present

## 2023-02-19 ENCOUNTER — Encounter: Payer: Self-pay | Admitting: Cardiology

## 2023-02-19 ENCOUNTER — Ambulatory Visit: Payer: Medicare Other | Attending: Cardiology | Admitting: Cardiology

## 2023-02-19 VITALS — BP 120/50 | HR 84 | Ht 66.0 in | Wt 173.8 lb

## 2023-02-19 DIAGNOSIS — R002 Palpitations: Secondary | ICD-10-CM | POA: Insufficient documentation

## 2023-02-19 DIAGNOSIS — I700 Atherosclerosis of aorta: Secondary | ICD-10-CM | POA: Insufficient documentation

## 2023-02-19 DIAGNOSIS — I10 Essential (primary) hypertension: Secondary | ICD-10-CM | POA: Insufficient documentation

## 2023-02-19 NOTE — Progress Notes (Signed)
Cardiology Office Note:   Date:  02/19/2023  ID:  Margaret Hodges, DOB [birth year], MRN [id number] PCP: Mila Palmer, MD  [facility] Providers Cardiologist:  Donato Schultz, MD    History of Present Illness:   Discussed the use of AI scribe software for clinical note transcription with the patient, who gave verbal consent to proceed.  History of Present Illness   The patient is a 77 year old individual with a history of hypertension, anginal pain, hypercholesterolemia, hiatal hernia, chronic bilateral knee and back pain, and degenerative disc disease. She was last seen a year ago for evaluation of tachycardia and dizziness. At that time, she reported occasional episodes of feeling warm and flushed, shortness of breath, and a general feeling of unease described as a "funny feeling." A heart monitor worn after the visit showed sinus rhythm, rare atrial tachycardia, rare PACs, and PVCs.  Despite the normal heart monitor results, the patient continues to experience these "spells," which she now attributes more to GERD. She describes the sensation as a pressure that can occur at any time, regardless of activity level. The frequency of these episodes varies, and the patient has learned to ignore them. She has not noticed any improvement in symptoms with omeprazole, which she tries to take in the morning on an empty stomach.  The patient remains active, engaging in activities such as trimming bushes, cleaning the house, and walking dogs. She reports no significant changes in her symptoms over the past year. She has a history of cancer and has undergone multiple cardiac evaluations, including stress tests and a heart catheterization, all of which have been normal. The patient also has a history of high cholesterol, but she is allergic to statin medications.  The patient occasionally experiences swelling in her legs after prolonged standing, but this improves with elevation.   Regarding  palpitations, she denies significant symptoms.      Studies Reviewed:    EKG:   EKG Interpretation Date/Time:  Monday February 19 2023 15:11:51 EST Ventricular Rate:  84 PR Interval:  154 QRS Duration:  72 QT Interval:  358 QTC Calculation: 423 R Axis:   -59  Text Interpretation: Normal sinus rhythm Low voltage QRS Left anterior fascicular block Inferior infarct , age undetermined Cannot rule out Anterior infarct , age undetermined When compared with ECG of 05-May-2020 14:06, No significant change was found Confirmed by Perlie Gold [phone]) on 02/19/2023 3:27:49 PM    06/10/20 Myoview Stress Test   The left ventricular ejection fraction is hyperdynamic (>65%). Nuclear stress EF: 76%. There was no ST segment deviation noted during stress. The study is normal. This is a low risk study.   Normal stress nuclear study with no ischemia or infarction.  Gated ejection fraction 76% with normal wall motion.  Risk Assessment/Calculations:              Physical Exam:   VS:  BP (!) 120/50   Pulse 84   Ht 5\' 6"  (1.676 m)   Wt 173 lb 12.8 oz (78.8 kg)   SpO2 97%   BMI 28.05 kg/m    Wt Readings from Last 3 Encounters:  02/19/23 173 lb 12.8 oz (78.8 kg)  01/31/22 167 lb (75.8 kg)  09/14/21 176 lb 5 oz (80 kg)     Physical Exam Vitals reviewed.  Constitutional:      Appearance: Normal appearance.  HENT:     Head: Normocephalic.     Nose: Nose normal.  Eyes:     Pupils:  Pupils are equal, round, and reactive to light.  Cardiovascular:     Rate and Rhythm: Normal rate and regular rhythm.     Pulses: Normal pulses.     Heart sounds: Normal heart sounds. No murmur heard.    No friction rub. No gallop.  Pulmonary:     Effort: Pulmonary effort is normal.     Breath sounds: Normal breath sounds.  Musculoskeletal:     Right lower leg: No edema.     Left lower leg: No edema.  Skin:    General: Skin is warm and dry.     Capillary Refill: Capillary refill takes less than 2  seconds.  Neurological:     General: No focal deficit present.     Mental Status: She is alert and oriented to person, place, and time.  Psychiatric:        Mood and Affect: Mood normal.        Behavior: Behavior normal.        Thought Content: Thought content normal.        Judgment: Judgment normal.     ASSESSMENT AND PLAN:     Assessment and Plan    Chest Pressure Intermittent chest pressure with shortness of breath, present for 1-1.5 years, occurring at rest and with exertion (but not limiting of exertion). Previous evaluations (heart monitor, stress tests, echocardiogram) normal. Symptoms not significantly changed with omeprazole. ECG today is reassuring and stable. No evidence of ischemia. Differential includes cardiac (microvascular disease or coronary artery vasospasm) and non-cardiac (hiatal hernia, GERD) causes. Discussed cardiac CTA or PET stress test; patient prefers symptom monitoring. - Monitor symptoms - Consider cardiac CTA or PET stress test if symptoms worsen - Educate on taking omeprazole on an empty stomach, possibly before bed rather than in the morning since she already needs Levothyroxine on an empty stomach.  Hypercholesterolemia Aortic atherosclerosis Consistently high cholesterol with statin intolerance. Discussed PCSK9 inhibitors, effective with better side effect profile. Patient concerned about new medications due to multiple allergies. - Consider PCSK9 inhibitors - Provide information on PCSK9 inhibitors in after-visit summary - Continue with efforts at dietary control  Hypertension Blood pressure well-controlled with current medication regimen. - Continue amlodipine 5 mg  General Health Maintenance Discussed importance of monitoring symptoms and considering new treatment options for hypercholesterolemia. - Monitor for any changes in symptoms - Follow up with primary care for routine health maintenance  Follow-up - Follow up if chest pressure  symptoms increase in frequency or severity - Consider follow-up for potential initiation of PCSK9 inhibitors.             Signed, Perlie Gold, PA-C

## 2023-02-19 NOTE — Patient Instructions (Signed)
Medication Instructions:  Your physician recommends that you continue on your current medications as directed. Please refer to the Current Medication list given to you today. *If you need a refill on your cardiac medications before your next appointment, please call your pharmacy*   Lab Work: You can research PCSK9 for cholesterol If you have labs (blood work) drawn today and your tests are completely normal, you will receive your results only by: MyChart Message (if you have MyChart) OR A paper copy in the mail If you have any lab test that is abnormal or we need to change your treatment, we will call you to review the results.   Testing/Procedures: None ordered   Follow-Up: At [facility], you and your health needs are our priority.  As part of our continuing mission to provide you with exceptional heart care, we have created designated Provider Care Teams.  These Care Teams include your primary Cardiologist (physician) and Advanced Practice Providers (APPs -  Physician Assistants and Nurse Practitioners) who all work together to provide you with the care you need, when you need it.  We recommend signing up for the patient portal called "MyChart".  Sign up information is provided on this After Visit Summary.  MyChart is used to connect with patients for Virtual Visits (Telemedicine).  Patients are able to view lab/test results, encounter notes, upcoming appointments, etc.  Non-urgent messages can be sent to your provider as well.   To learn more about what you can do with MyChart, go to [email or web address].    Your next appointment:   12 month(s)  Provider:   Donato Schultz, MD     Other Instructions

## 2023-03-07 DIAGNOSIS — M8588 Other specified disorders of bone density and structure, other site: Secondary | ICD-10-CM | POA: Diagnosis not present

## 2023-03-07 DIAGNOSIS — Z853 Personal history of malignant neoplasm of breast: Secondary | ICD-10-CM | POA: Diagnosis not present

## 2023-03-07 DIAGNOSIS — Z1231 Encounter for screening mammogram for malignant neoplasm of breast: Secondary | ICD-10-CM | POA: Diagnosis not present

## 2023-03-13 DIAGNOSIS — L570 Actinic keratosis: Secondary | ICD-10-CM | POA: Diagnosis not present

## 2023-03-14 DIAGNOSIS — N3000 Acute cystitis without hematuria: Secondary | ICD-10-CM | POA: Diagnosis not present

## 2023-04-13 DIAGNOSIS — R399 Unspecified symptoms and signs involving the genitourinary system: Secondary | ICD-10-CM | POA: Diagnosis not present

## 2023-05-18 DIAGNOSIS — H43393 Other vitreous opacities, bilateral: Secondary | ICD-10-CM | POA: Diagnosis not present

## 2023-05-18 DIAGNOSIS — H26491 Other secondary cataract, right eye: Secondary | ICD-10-CM | POA: Diagnosis not present

## 2023-05-18 DIAGNOSIS — H16223 Keratoconjunctivitis sicca, not specified as Sjogren's, bilateral: Secondary | ICD-10-CM | POA: Diagnosis not present

## 2023-05-18 DIAGNOSIS — Z961 Presence of intraocular lens: Secondary | ICD-10-CM | POA: Diagnosis not present

## 2023-05-18 DIAGNOSIS — H0288A Meibomian gland dysfunction right eye, upper and lower eyelids: Secondary | ICD-10-CM | POA: Diagnosis not present

## 2023-05-18 DIAGNOSIS — H0288B Meibomian gland dysfunction left eye, upper and lower eyelids: Secondary | ICD-10-CM | POA: Diagnosis not present

## 2023-05-18 DIAGNOSIS — H18413 Arcus senilis, bilateral: Secondary | ICD-10-CM | POA: Diagnosis not present

## 2023-06-06 DIAGNOSIS — R7303 Prediabetes: Secondary | ICD-10-CM | POA: Diagnosis not present

## 2023-06-06 DIAGNOSIS — E559 Vitamin D deficiency, unspecified: Secondary | ICD-10-CM | POA: Diagnosis not present

## 2023-06-06 DIAGNOSIS — E039 Hypothyroidism, unspecified: Secondary | ICD-10-CM | POA: Diagnosis not present

## 2023-06-06 DIAGNOSIS — Z0000 Encounter for general adult medical examination without abnormal findings: Secondary | ICD-10-CM | POA: Diagnosis not present

## 2023-06-06 DIAGNOSIS — E538 Deficiency of other specified B group vitamins: Secondary | ICD-10-CM | POA: Diagnosis not present

## 2023-06-06 DIAGNOSIS — E785 Hyperlipidemia, unspecified: Secondary | ICD-10-CM | POA: Diagnosis not present

## 2023-06-06 DIAGNOSIS — I10 Essential (primary) hypertension: Secondary | ICD-10-CM | POA: Diagnosis not present

## 2023-06-11 DIAGNOSIS — E559 Vitamin D deficiency, unspecified: Secondary | ICD-10-CM | POA: Diagnosis not present

## 2023-06-11 DIAGNOSIS — Z79899 Other long term (current) drug therapy: Secondary | ICD-10-CM | POA: Diagnosis not present

## 2023-06-11 DIAGNOSIS — Z0000 Encounter for general adult medical examination without abnormal findings: Secondary | ICD-10-CM | POA: Diagnosis not present

## 2023-06-11 DIAGNOSIS — E785 Hyperlipidemia, unspecified: Secondary | ICD-10-CM | POA: Diagnosis not present

## 2023-06-11 DIAGNOSIS — F17211 Nicotine dependence, cigarettes, in remission: Secondary | ICD-10-CM | POA: Diagnosis not present

## 2023-06-11 DIAGNOSIS — R7303 Prediabetes: Secondary | ICD-10-CM | POA: Diagnosis not present

## 2023-06-11 DIAGNOSIS — I700 Atherosclerosis of aorta: Secondary | ICD-10-CM | POA: Diagnosis not present

## 2023-06-11 DIAGNOSIS — F330 Major depressive disorder, recurrent, mild: Secondary | ICD-10-CM | POA: Diagnosis not present

## 2023-06-11 DIAGNOSIS — R7301 Impaired fasting glucose: Secondary | ICD-10-CM | POA: Diagnosis not present

## 2023-06-11 DIAGNOSIS — J449 Chronic obstructive pulmonary disease, unspecified: Secondary | ICD-10-CM | POA: Diagnosis not present

## 2023-06-11 DIAGNOSIS — E049 Nontoxic goiter, unspecified: Secondary | ICD-10-CM | POA: Diagnosis not present

## 2023-06-11 DIAGNOSIS — E538 Deficiency of other specified B group vitamins: Secondary | ICD-10-CM | POA: Diagnosis not present

## 2023-06-13 ENCOUNTER — Other Ambulatory Visit: Payer: Self-pay | Admitting: Family Medicine

## 2023-06-13 DIAGNOSIS — F17211 Nicotine dependence, cigarettes, in remission: Secondary | ICD-10-CM

## 2023-06-13 DIAGNOSIS — N390 Urinary tract infection, site not specified: Secondary | ICD-10-CM | POA: Diagnosis not present

## 2023-06-13 DIAGNOSIS — N3000 Acute cystitis without hematuria: Secondary | ICD-10-CM | POA: Diagnosis not present

## 2023-07-10 ENCOUNTER — Ambulatory Visit
Admission: RE | Admit: 2023-07-10 | Discharge: 2023-07-10 | Disposition: A | Discharge status: Home / Self Care | Source: Ambulatory Visit | Attending: Family Medicine | Admitting: Family Medicine

## 2023-07-10 DIAGNOSIS — Z87891 Personal history of nicotine dependence: Secondary | ICD-10-CM | POA: Diagnosis not present

## 2023-07-10 DIAGNOSIS — F17211 Nicotine dependence, cigarettes, in remission: Secondary | ICD-10-CM

## 2023-07-10 DIAGNOSIS — Z122 Encounter for screening for malignant neoplasm of respiratory organs: Secondary | ICD-10-CM | POA: Diagnosis not present

## 2023-09-10 DIAGNOSIS — M25561 Pain in right knee: Secondary | ICD-10-CM | POA: Diagnosis not present

## 2023-09-10 DIAGNOSIS — G894 Chronic pain syndrome: Secondary | ICD-10-CM | POA: Diagnosis not present

## 2023-09-13 DIAGNOSIS — Z129 Encounter for screening for malignant neoplasm, site unspecified: Secondary | ICD-10-CM | POA: Diagnosis not present

## 2023-09-13 DIAGNOSIS — D2262 Melanocytic nevi of left upper limb, including shoulder: Secondary | ICD-10-CM | POA: Diagnosis not present

## 2023-09-13 DIAGNOSIS — L570 Actinic keratosis: Secondary | ICD-10-CM | POA: Diagnosis not present

## 2023-09-13 DIAGNOSIS — L814 Other melanin hyperpigmentation: Secondary | ICD-10-CM | POA: Diagnosis not present

## 2023-09-26 DIAGNOSIS — M1711 Unilateral primary osteoarthritis, right knee: Secondary | ICD-10-CM | POA: Diagnosis not present

## 2023-10-24 DIAGNOSIS — M1711 Unilateral primary osteoarthritis, right knee: Secondary | ICD-10-CM | POA: Diagnosis not present

## 2023-12-14 DIAGNOSIS — H6123 Impacted cerumen, bilateral: Secondary | ICD-10-CM | POA: Diagnosis not present

## 2023-12-31 DIAGNOSIS — H90A32 Mixed conductive and sensorineural hearing loss, unilateral, left ear with restricted hearing on the contralateral side: Secondary | ICD-10-CM | POA: Diagnosis not present

## 2024-01-09 DIAGNOSIS — I10 Essential (primary) hypertension: Secondary | ICD-10-CM | POA: Diagnosis not present

## 2024-01-09 DIAGNOSIS — M1990 Unspecified osteoarthritis, unspecified site: Secondary | ICD-10-CM | POA: Diagnosis not present

## 2024-01-09 DIAGNOSIS — K219 Gastro-esophageal reflux disease without esophagitis: Secondary | ICD-10-CM | POA: Diagnosis not present

## 2024-01-09 DIAGNOSIS — E782 Mixed hyperlipidemia: Secondary | ICD-10-CM | POA: Diagnosis not present

## 2024-01-09 DIAGNOSIS — N182 Chronic kidney disease, stage 2 (mild): Secondary | ICD-10-CM | POA: Diagnosis not present

## 2024-01-09 DIAGNOSIS — R7303 Prediabetes: Secondary | ICD-10-CM | POA: Diagnosis not present

## 2024-01-09 DIAGNOSIS — Z853 Personal history of malignant neoplasm of breast: Secondary | ICD-10-CM | POA: Diagnosis not present

## 2024-01-09 DIAGNOSIS — D7589 Other specified diseases of blood and blood-forming organs: Secondary | ICD-10-CM | POA: Diagnosis not present

## 2024-01-09 DIAGNOSIS — Z6826 Body mass index (BMI) 26.0-26.9, adult: Secondary | ICD-10-CM | POA: Diagnosis not present

## 2024-01-09 DIAGNOSIS — G894 Chronic pain syndrome: Secondary | ICD-10-CM | POA: Diagnosis not present
